# Patient Record
Sex: Female | Born: 1985 | Race: Black or African American | Hispanic: No | Marital: Single | Smoking: Never smoker
Health system: Southern US, Community
[De-identification: ages and names within clinical notes are randomized; demographics above are authoritative.]

## PROBLEM LIST (undated history)

## (undated) ENCOUNTER — Inpatient Hospital Stay (HOSPITAL_COMMUNITY): Payer: Self-pay

## (undated) DIAGNOSIS — D219 Benign neoplasm of connective and other soft tissue, unspecified: Secondary | ICD-10-CM

## (undated) DIAGNOSIS — R87629 Unspecified abnormal cytological findings in specimens from vagina: Secondary | ICD-10-CM

## (undated) DIAGNOSIS — F419 Anxiety disorder, unspecified: Secondary | ICD-10-CM

## (undated) DIAGNOSIS — O24419 Gestational diabetes mellitus in pregnancy, unspecified control: Secondary | ICD-10-CM

## (undated) DIAGNOSIS — E282 Polycystic ovarian syndrome: Secondary | ICD-10-CM

## (undated) DIAGNOSIS — F32A Depression, unspecified: Secondary | ICD-10-CM

## (undated) DIAGNOSIS — K219 Gastro-esophageal reflux disease without esophagitis: Secondary | ICD-10-CM

## (undated) DIAGNOSIS — R7303 Prediabetes: Secondary | ICD-10-CM

## (undated) DIAGNOSIS — R221 Localized swelling, mass and lump, neck: Secondary | ICD-10-CM

## (undated) DIAGNOSIS — Z8619 Personal history of other infectious and parasitic diseases: Secondary | ICD-10-CM

## (undated) DIAGNOSIS — Z789 Other specified health status: Secondary | ICD-10-CM

## (undated) DIAGNOSIS — IMO0002 Reserved for concepts with insufficient information to code with codable children: Secondary | ICD-10-CM

## (undated) DIAGNOSIS — D649 Anemia, unspecified: Secondary | ICD-10-CM

## (undated) DIAGNOSIS — E669 Obesity, unspecified: Secondary | ICD-10-CM

## (undated) HISTORY — DX: Benign neoplasm of connective and other soft tissue, unspecified: D21.9

## (undated) HISTORY — DX: Reserved for concepts with insufficient information to code with codable children: IMO0002

## (undated) HISTORY — DX: Personal history of other infectious and parasitic diseases: Z86.19

## (undated) HISTORY — DX: Gestational diabetes mellitus in pregnancy, unspecified control: O24.419

## (undated) HISTORY — DX: Obesity, unspecified: E66.9

---
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
## 2008-08-10 DIAGNOSIS — Z8619 Personal history of other infectious and parasitic diseases: Secondary | ICD-10-CM

## 2008-08-10 HISTORY — DX: Personal history of other infectious and parasitic diseases: Z86.19

## 2009-11-30 ENCOUNTER — Emergency Department (HOSPITAL_COMMUNITY): Admission: EM | Admit: 2009-11-30 | Discharge: 2009-11-30 | Payer: Self-pay | Admitting: Emergency Medicine

## 2010-10-28 LAB — POCT I-STAT, CHEM 8
BUN: 12 mg/dL (ref 6–23)
Creatinine, Ser: 0.7 mg/dL (ref 0.4–1.2)
Hemoglobin: 13.9 g/dL (ref 12.0–15.0)
Potassium: 3.7 mEq/L (ref 3.5–5.1)
Sodium: 138 mEq/L (ref 135–145)

## 2011-05-30 ENCOUNTER — Inpatient Hospital Stay (HOSPITAL_COMMUNITY)
Admission: AD | Admit: 2011-05-30 | Discharge: 2011-05-30 | Disposition: A | Discharge status: Home / Self Care | Payer: Medicaid Other | Source: Ambulatory Visit | Attending: Obstetrics & Gynecology | Admitting: Obstetrics & Gynecology

## 2011-05-30 ENCOUNTER — Inpatient Hospital Stay (HOSPITAL_COMMUNITY): Payer: Medicaid Other

## 2011-05-30 ENCOUNTER — Encounter (HOSPITAL_COMMUNITY): Payer: Self-pay

## 2011-05-30 DIAGNOSIS — O210 Mild hyperemesis gravidarum: Secondary | ICD-10-CM | POA: Insufficient documentation

## 2011-05-30 DIAGNOSIS — O219 Vomiting of pregnancy, unspecified: Secondary | ICD-10-CM

## 2011-05-30 LAB — URINALYSIS, ROUTINE W REFLEX MICROSCOPIC
Hgb urine dipstick: NEGATIVE
Leukocytes, UA: NEGATIVE
Protein, ur: NEGATIVE mg/dL
Specific Gravity, Urine: 1.02 (ref 1.005–1.030)
Urobilinogen, UA: 0.2 mg/dL (ref 0.0–1.0)

## 2011-05-30 LAB — WET PREP, GENITAL: Trich, Wet Prep: NONE SEEN

## 2011-05-30 LAB — POCT PREGNANCY, URINE: Preg Test, Ur: POSITIVE

## 2011-05-30 MED ORDER — PRENATAL RX 60-1 MG PO TABS: 1.0000 | ORAL_TABLET | Freq: Every day | ORAL | Status: DC

## 2011-05-30 NOTE — ED Provider Notes (Signed)
Brenda [name] y.o.G1P0 @[redacted]w[redacted]d  by sure LMP Chief Complaint  Patient presents with  . Emesis  . Abdominal Cramping    SUBJECTIVE  HPI: 1 day hx suprapubic cramping like menstrual cramps but more severe at times. Tylenol not helping.She's also had nausea and rare vomiting for several days.  No vaginal bleeding or irriattive vaginal discharge. No dysuria, hematuria, frequency, urgency, back pain or fever/chills. BMs normal. [employer], needs to apply for [facility]  Past Medical History  Diagnosis Date  . No pertinent past medical history     Gyn Hx: neg STIs No past surgical history on file. History   Social History  . Marital Status: Single    Spouse Name: N/A    Number of Children: N/A  . Years of Education: N/A   Occupational History  . Not on file.   Social History Main Topics  . Smoking status: Not on file  . Smokeless tobacco: Not on file  . Alcohol Use:   . Drug Use:   . Sexually Active:    Other Topics Concern  . Not on file   Social History Narrative  . No narrative on file   No current facility-administered medications on file prior to encounter.   No current outpatient prescriptions on file prior to encounter.   No Known Allergies  ROS: Pertinent items in HPI  OBJECTIVE  BP 127/75  Pulse 105  Temp(Src) 98.2 F (36.8 C) (Oral)  Resp 16  Ht 5' 3.5" (1.613 m)  Wt 98.703 kg (217 lb 9.6 oz)  BMI 37.94 kg/m2  LMP 04/14/2011   Physical Exam:  General: WN/WD in NAD Abd: obese NT Pelvic: NEFG; Vagina pink without lesions, milky discharge; cx post clean nulliparous VE: Cx L/C/H, no CMT, c/w 6 wk size Back: neg CVAT Ext:no edema  US Ob Comp Less 14 Wks  05/30/2011  OBSTETRICAL ULTRASOUND: This exam was performed within a [facility]. The OB US report was generated in the AS system, and faxed to the ordering physician.   This report is also available in [employer] and in the [employer]. See AS  Obstetric US report.   US Ob Transvaginal  05/30/2011  OBSTETRICAL ULTRASOUND: This exam was performed within a [facility] Department. The OB US report was generated in the AS system, and faxed to the ordering physician.   This report is also available in [employer] and in the [employer]. See AS Obstetric US report.   Per [place] tech: viable IUP with HR 102 and [redacted]w[redacted]d by sac size  ASSESSMENT  Viable IUP [redacted]w[redacted]d Nausea of pregnancy   PLAN WP, GC/CT sent; rx PNV and phenergan; preg verif letter; list of care providers; precautions reviewed

## 2011-05-30 NOTE — Progress Notes (Signed)
LMP 9/4/-9/7, positive pregnant test at home, cramping since yesterday, vomited x 1 in the evening.

## 2011-06-01 LAB — GC/CHLAMYDIA PROBE AMP, GENITAL: GC Probe Amp, Genital: NEGATIVE

## 2011-06-02 NOTE — ED Provider Notes (Signed)
Attestation of Attending Supervision of Advanced Practitioner: Evaluation and management procedures were performed by the PA/NP/CNM/OB Fellow under my supervision/collaboration. Chart reviewed and agree with management and plan.  ANYANWU,UGONNA A M.D. 06/02/2011 11:19 AM   

## 2011-06-16 ENCOUNTER — Inpatient Hospital Stay (HOSPITAL_COMMUNITY)
Admission: AD | Admit: 2011-06-16 | Discharge: 2011-06-16 | Discharge status: Against Medical Advice | Payer: Self-pay | Source: Ambulatory Visit | Attending: Obstetrics & Gynecology | Admitting: Obstetrics & Gynecology

## 2011-06-16 DIAGNOSIS — R509 Fever, unspecified: Secondary | ICD-10-CM | POA: Insufficient documentation

## 2011-06-16 DIAGNOSIS — R51 Headache: Secondary | ICD-10-CM | POA: Insufficient documentation

## 2011-06-16 DIAGNOSIS — O99891 Other specified diseases and conditions complicating pregnancy: Secondary | ICD-10-CM | POA: Insufficient documentation

## 2011-06-16 LAB — URINALYSIS, ROUTINE W REFLEX MICROSCOPIC
Glucose, UA: NEGATIVE mg/dL
Hgb urine dipstick: NEGATIVE
Leukocytes, UA: NEGATIVE
Protein, ur: NEGATIVE mg/dL
Specific Gravity, Urine: >1.03 — ABNORMAL HIGH (ref 1.005–1.030)
pH: 6 (ref 5.0–8.0)

## 2011-06-16 NOTE — Progress Notes (Signed)
Patient states she has been having cold like symptoms since 11-4. Has been feeling warm and tired but has not taken her temp. Just not feeling well.

## 2011-06-16 NOTE — ED Notes (Signed)
Pt signed out; she stated she was tired and hungry and didn't want to wait any longer.

## 2011-07-15 ENCOUNTER — Other Ambulatory Visit (HOSPITAL_COMMUNITY): Payer: Self-pay | Admitting: Physician Assistant

## 2011-07-15 DIAGNOSIS — Z3682 Encounter for antenatal screening for nuchal translucency: Secondary | ICD-10-CM

## 2011-07-15 LAB — OB RESULTS CONSOLE RUBELLA ANTIBODY, IGM: Rubella: NON-IMMUNE/NOT IMMUNE

## 2011-07-15 LAB — OB RESULTS CONSOLE RPR: RPR: NONREACTIVE

## 2011-07-15 LAB — OB RESULTS CONSOLE HIV ANTIBODY (ROUTINE TESTING): HIV: NONREACTIVE

## 2011-07-16 ENCOUNTER — Ambulatory Visit (HOSPITAL_COMMUNITY)
Admission: RE | Admit: 2011-07-16 | Discharge: 2011-07-16 | Disposition: A | Discharge status: Home / Self Care | Payer: Self-pay | Source: Ambulatory Visit | Attending: Physician Assistant | Admitting: Physician Assistant

## 2011-07-16 DIAGNOSIS — O3510X0 Maternal care for (suspected) chromosomal abnormality in fetus, unspecified, not applicable or unspecified: Secondary | ICD-10-CM | POA: Insufficient documentation

## 2011-07-16 DIAGNOSIS — E669 Obesity, unspecified: Secondary | ICD-10-CM | POA: Insufficient documentation

## 2011-07-16 DIAGNOSIS — O351XX0 Maternal care for (suspected) chromosomal abnormality in fetus, not applicable or unspecified: Secondary | ICD-10-CM | POA: Insufficient documentation

## 2011-07-16 DIAGNOSIS — Z3682 Encounter for antenatal screening for nuchal translucency: Secondary | ICD-10-CM

## 2011-07-16 DIAGNOSIS — O3440 Maternal care for other abnormalities of cervix, unspecified trimester: Secondary | ICD-10-CM | POA: Insufficient documentation

## 2011-08-06 DIAGNOSIS — J45998 Other asthma: Secondary | ICD-10-CM | POA: Insufficient documentation

## 2011-08-06 DIAGNOSIS — D219 Benign neoplasm of connective and other soft tissue, unspecified: Secondary | ICD-10-CM | POA: Insufficient documentation

## 2011-08-06 LAB — OB RESULTS CONSOLE ABO/RH

## 2011-08-11 NOTE — L&D Delivery Note (Signed)
Delivery Note At 7:26 AM a viable female, "Brenda [name]", was delivered via Vaginal, Spontaneous Delivery (Presentation: Right Occiput Anterior).  APGAR: 9, 9; weight .   Placenta status: Intact, Spontaneous.  Cord: 3 vessels with the following complications: None.  Cord pH: NA  Anesthesia: Epidural  Episiotomy: None Lacerations: Periurethral--bilateral, repaired for hemostasis Suture Repair: 3.0 monocryl Est. Blood Loss (mL): 200 cc  Mom to postpartum.  Baby to skin to skin. Will check fasting CBG in am.  Dallys Nowakowski 01/15/2012, 8:18 AM

## 2011-08-21 ENCOUNTER — Encounter: Payer: Self-pay | Admitting: Obstetrics and Gynecology

## 2011-08-21 ENCOUNTER — Encounter (HOSPITAL_COMMUNITY): Payer: Self-pay

## 2011-08-21 ENCOUNTER — Inpatient Hospital Stay (HOSPITAL_COMMUNITY)
Admission: AD | Admit: 2011-08-21 | Discharge: 2011-08-21 | Disposition: A | Discharge status: Home / Self Care | Payer: Medicaid Other | Source: Ambulatory Visit | Attending: Obstetrics and Gynecology | Admitting: Obstetrics and Gynecology

## 2011-08-21 DIAGNOSIS — R51 Headache: Secondary | ICD-10-CM | POA: Insufficient documentation

## 2011-08-21 DIAGNOSIS — O99891 Other specified diseases and conditions complicating pregnancy: Secondary | ICD-10-CM | POA: Insufficient documentation

## 2011-08-21 LAB — URINALYSIS, ROUTINE W REFLEX MICROSCOPIC
Glucose, UA: NEGATIVE mg/dL
Ketones, ur: 15 mg/dL — AB
Leukocytes, UA: NEGATIVE
Nitrite: NEGATIVE
Specific Gravity, Urine: 1.02 (ref 1.005–1.030)
pH: 7.5 (ref 5.0–8.0)

## 2011-08-21 MED ORDER — IBUPROFEN 800 MG PO TABS
800.0000 mg | ORAL_TABLET | Freq: Once | ORAL | Status: AC
  Administered 2011-08-21: 800 mg via ORAL
  Filled 2011-08-21: qty 1

## 2011-08-21 NOTE — Progress Notes (Signed)
No adverse effect from ibuprofen

## 2011-08-21 NOTE — Progress Notes (Signed)
Pt states headache x2-3 days, pain more on right side of head. Denies visual disturbance, has not taken any medications for h/a. Also states she has been waiting too long to eat, then will be overly hungry and feels fatigued.

## 2011-08-21 NOTE — ED Provider Notes (Signed)
History     Chief Complaint  Patient presents with  . Headache   HPI Comments: Pt is a 25yo G1P0 at [redacted]w[redacted]d that arrives to MAU unannounced with c/o HA x2 days. Denies any other c/o. Has not taken any OTC. Reports not eating very often because "I don't feel hungry" and doesn't like to drink water. Denies any cramping, bleeding, d/c. Has appointment the 23rd for Anat [place].     Headache       Past Medical History  Diagnosis Date  . Asthma     Past Surgical History  Procedure Date  . No past surgeries     Family History  Problem Relation Age of Onset  . Anesthesia problems Neg Hx     History  Substance Use Topics  . Smoking status: Never Smoker   . Smokeless tobacco: Not on file  . Alcohol Use: No    Allergies: No Known Allergies  Prescriptions prior to admission  Medication Sig Dispense Refill  . Prenatal Vit-Fe Fumarate-FA (PRENATAL MULTIVITAMIN) 60-1 MG tablet Take 1 tablet by mouth daily.  30 tablet  4  . DISCONTD: Pseudoephedrine-DM-GG (ROBITUSSIN CF PO) Take 5 mLs by mouth daily as needed. Patient took this medication for a cold.      Marland Kitchen DISCONTD: acetaminophen (TYLENOL) 325 MG tablet Take by mouth every 6 (six) hours as needed. Patient used this medication for pain.        Review of Systems  Neurological: Positive for headaches.  All other systems reviewed and are negative.   Physical Exam   Blood pressure 126/74, pulse 106, temperature 98.4 F (36.9 C), temperature source Oral, resp. rate 16, height 5' 3.75" (1.619 m), weight 104.838 kg (231 lb 2 oz), last menstrual period 04/14/2011.  Physical Exam  Nursing note and vitals reviewed. Constitutional: She is oriented to person, place, and time. She appears well-developed and well-nourished.  Cardiovascular: Normal rate.   Respiratory: Effort normal.  GI: Soft.  Genitourinary:       deferred  Musculoskeletal: Normal range of motion. She exhibits no edema.  Neurological: She is alert and oriented to  person, place, and time. She has normal reflexes.  Skin: Skin is warm and dry.  Psychiatric: She has a normal mood and affect. Her behavior is normal.    MAU Course  Procedures   Assessment and Plan  IUP at 18w Headache - pt states its starting to feel better after napping in MAU room +FHT's  Motrin 800mg  PO D/C home F/u routine Recommend increasing PO fluids and eating more frequently.    Willett Lefeber M 08/21/2011, 4:02 PM

## 2011-08-26 ENCOUNTER — Ambulatory Visit (HOSPITAL_COMMUNITY): Payer: Medicaid Other | Attending: Physician Assistant

## 2011-08-31 ENCOUNTER — Other Ambulatory Visit: Payer: Self-pay

## 2011-09-09 ENCOUNTER — Ambulatory Visit (HOSPITAL_COMMUNITY)
Admission: RE | Admit: 2011-09-09 | Discharge: 2011-09-09 | Disposition: A | Discharge status: Home / Self Care | Payer: Medicaid Other | Source: Ambulatory Visit | Attending: Physician Assistant | Admitting: Physician Assistant

## 2011-09-09 DIAGNOSIS — O3410 Maternal care for benign tumor of corpus uteri, unspecified trimester: Secondary | ICD-10-CM | POA: Insufficient documentation

## 2011-09-09 DIAGNOSIS — Z363 Encounter for antenatal screening for malformations: Secondary | ICD-10-CM | POA: Insufficient documentation

## 2011-09-09 DIAGNOSIS — O358XX0 Maternal care for other (suspected) fetal abnormality and damage, not applicable or unspecified: Secondary | ICD-10-CM | POA: Insufficient documentation

## 2011-09-09 DIAGNOSIS — O99210 Obesity complicating pregnancy, unspecified trimester: Secondary | ICD-10-CM | POA: Insufficient documentation

## 2011-09-09 DIAGNOSIS — Z1389 Encounter for screening for other disorder: Secondary | ICD-10-CM | POA: Insufficient documentation

## 2011-09-09 DIAGNOSIS — E669 Obesity, unspecified: Secondary | ICD-10-CM | POA: Insufficient documentation

## 2011-09-09 DIAGNOSIS — Z3689 Encounter for other specified antenatal screening: Secondary | ICD-10-CM

## 2011-09-09 DIAGNOSIS — Z3682 Encounter for antenatal screening for nuchal translucency: Secondary | ICD-10-CM

## 2011-09-09 NOTE — Progress Notes (Signed)
Obstetric ultrasound performed today.  Fetal anatomy limited but reassuring.  Overall growth lagging.  Follow up scheduled.  Please see full report in [facility].

## 2011-09-23 ENCOUNTER — Other Ambulatory Visit (HOSPITAL_COMMUNITY): Payer: Self-pay | Admitting: Maternal and Fetal Medicine

## 2011-09-23 ENCOUNTER — Ambulatory Visit (HOSPITAL_COMMUNITY)
Admission: RE | Admit: 2011-09-23 | Discharge: 2011-09-23 | Disposition: A | Discharge status: Home / Self Care | Payer: Medicaid Other | Source: Ambulatory Visit | Attending: Physician Assistant | Admitting: Physician Assistant

## 2011-09-23 DIAGNOSIS — D219 Benign neoplasm of connective and other soft tissue, unspecified: Secondary | ICD-10-CM

## 2011-09-23 DIAGNOSIS — E669 Obesity, unspecified: Secondary | ICD-10-CM | POA: Insufficient documentation

## 2011-09-23 DIAGNOSIS — O99210 Obesity complicating pregnancy, unspecified trimester: Secondary | ICD-10-CM

## 2011-09-23 DIAGNOSIS — O3410 Maternal care for benign tumor of corpus uteri, unspecified trimester: Secondary | ICD-10-CM | POA: Insufficient documentation

## 2011-09-23 DIAGNOSIS — O3440 Maternal care for other abnormalities of cervix, unspecified trimester: Secondary | ICD-10-CM | POA: Insufficient documentation

## 2011-09-23 DIAGNOSIS — Z3689 Encounter for other specified antenatal screening: Secondary | ICD-10-CM

## 2011-10-13 ENCOUNTER — Other Ambulatory Visit (HOSPITAL_COMMUNITY): Payer: Self-pay | Admitting: Physician Assistant

## 2011-10-13 DIAGNOSIS — O3440 Maternal care for other abnormalities of cervix, unspecified trimester: Secondary | ICD-10-CM

## 2011-10-13 DIAGNOSIS — O3410 Maternal care for benign tumor of corpus uteri, unspecified trimester: Secondary | ICD-10-CM

## 2011-10-13 DIAGNOSIS — O99891 Other specified diseases and conditions complicating pregnancy: Secondary | ICD-10-CM

## 2011-10-14 ENCOUNTER — Other Ambulatory Visit (HOSPITAL_COMMUNITY): Payer: Self-pay | Admitting: Physician Assistant

## 2011-10-14 ENCOUNTER — Ambulatory Visit (HOSPITAL_COMMUNITY)
Admission: RE | Admit: 2011-10-14 | Discharge: 2011-10-14 | Disposition: A | Discharge status: Home / Self Care | Payer: Medicaid Other | Source: Ambulatory Visit | Attending: Obstetrics and Gynecology | Admitting: Obstetrics and Gynecology

## 2011-10-14 DIAGNOSIS — O3440 Maternal care for other abnormalities of cervix, unspecified trimester: Secondary | ICD-10-CM

## 2011-10-14 DIAGNOSIS — O3410 Maternal care for benign tumor of corpus uteri, unspecified trimester: Secondary | ICD-10-CM | POA: Insufficient documentation

## 2011-10-14 DIAGNOSIS — O99891 Other specified diseases and conditions complicating pregnancy: Secondary | ICD-10-CM

## 2011-10-14 DIAGNOSIS — E669 Obesity, unspecified: Secondary | ICD-10-CM | POA: Insufficient documentation

## 2011-10-14 DIAGNOSIS — O365990 Maternal care for other known or suspected poor fetal growth, unspecified trimester, not applicable or unspecified: Secondary | ICD-10-CM

## 2011-10-16 ENCOUNTER — Inpatient Hospital Stay (HOSPITAL_COMMUNITY)
Admission: AD | Admit: 2011-10-16 | Discharge: 2011-10-16 | Disposition: A | Discharge status: Home / Self Care | Payer: Medicaid Other | Source: Ambulatory Visit | Attending: Obstetrics and Gynecology | Admitting: Obstetrics and Gynecology

## 2011-10-16 ENCOUNTER — Encounter (HOSPITAL_COMMUNITY): Payer: Self-pay

## 2011-10-16 DIAGNOSIS — O479 False labor, unspecified: Secondary | ICD-10-CM

## 2011-10-16 DIAGNOSIS — O4700 False labor before 37 completed weeks of gestation, unspecified trimester: Secondary | ICD-10-CM | POA: Insufficient documentation

## 2011-10-16 LAB — URINALYSIS, ROUTINE W REFLEX MICROSCOPIC
Bilirubin Urine: NEGATIVE
Glucose, UA: 250 mg/dL — AB
Ketones, ur: NEGATIVE mg/dL
pH: 6 (ref 5.0–8.0)

## 2011-10-16 LAB — WET PREP, GENITAL
Clue Cells Wet Prep HPF POC: NONE SEEN
Trich, Wet Prep: NONE SEEN

## 2011-10-16 NOTE — Progress Notes (Signed)
History    G1 P0  26 3/7 week IUP with c/o of cramping denie srom, vag bleeding, N,V,D,or UTI s/s. With +FM. No chief complaint on file.  @SFHPI @  OB History    Grav Para Term Preterm Abortions TAB SAB Ect Mult Living   1 0 0 0 0 0 0 0 0 0       Past Medical History  Diagnosis Date  . Asthma     Past Surgical History  Procedure Date  . No past surgeries     Family History  Problem Relation Age of Onset  . Anesthesia problems Neg Hx   . Hypertension Mother   . Hypertension Father     History  Substance Use Topics  . Smoking status: Never Smoker   . Smokeless tobacco: Not on file  . Alcohol Use: No    Allergies: No Known Allergies  Prescriptions prior to admission  Medication Sig Dispense Refill  . Prenatal Vit-Fe Fumarate-FA (PRENATAL MULTIVITAMIN) 60-1 MG tablet Take 1 tablet by mouth daily.  30 tablet  4   Results for orders placed during the hospital encounter of 10/16/11 (from the past 24 hour(s))  URINALYSIS, ROUTINE W REFLEX MICROSCOPIC     Status: Abnormal   Collection Time   10/16/11  3:20 PM      Component Value Range   Color, Urine YELLOW  YELLOW    APPearance CLEAR  CLEAR    Specific Gravity, Urine 1.020  1.005 - 1.030    pH 6.0  5.0 - 8.0    Glucose, UA 250 (*) NEGATIVE (mg/dL)   Hgb urine dipstick TRACE (*) NEGATIVE    Bilirubin Urine NEGATIVE  NEGATIVE    Ketones, ur NEGATIVE  NEGATIVE (mg/dL)   Protein, ur NEGATIVE  NEGATIVE (mg/dL)   Urobilinogen, UA 0.2  0.0 - 1.0 (mg/dL)   Nitrite NEGATIVE  NEGATIVE    Leukocytes, UA NEGATIVE  NEGATIVE   URINE MICROSCOPIC-ADD ON     Status: Abnormal   Collection Time   10/16/11  3:20 PM      Component Value Range   Squamous Epithelial / LPF MANY (*) RARE    WBC, UA 0-2  <3 (WBC/hpf)  WET PREP, GENITAL     Status: Abnormal   Collection Time   10/16/11  3:58 PM      Component Value Range   Yeast Wet Prep HPF POC NONE SEEN  NONE SEEN    Trich, Wet Prep NONE SEEN  NONE SEEN    Clue Cells Wet Prep HPF POC  NONE SEEN  NONE SEEN    WBC, Wet Prep HPF POC FEW (*) NONE SEEN   FETAL FIBRONECTIN     Status: Normal   Collection Time   10/16/11  4:03 PM      Component Value Range   Fetal Fibronectin NEGATIVE  NEGATIVE   PE abd soft, gravid, nt, EGBUS wnl, SSE, digital exam cervix LTC Fhts category 1 UC few A 26 3/7 week IUP P discharge home s/s ptl to report, collaboration with Dr. Estanislado Pandy per telephone. Lavera Guise, CNM

## 2011-10-16 NOTE — Discharge Instructions (Signed)

## 2011-10-17 LAB — GC/CHLAMYDIA PROBE AMP, GENITAL
Chlamydia, DNA Probe: NEGATIVE
GC Probe Amp, Genital: NEGATIVE

## 2011-10-28 ENCOUNTER — Encounter (INDEPENDENT_AMBULATORY_CARE_PROVIDER_SITE_OTHER): Payer: Medicaid Other | Admitting: Obstetrics and Gynecology

## 2011-10-28 DIAGNOSIS — O99810 Abnormal glucose complicating pregnancy: Secondary | ICD-10-CM

## 2011-11-04 ENCOUNTER — Encounter: Payer: Medicaid Other | Attending: Obstetrics and Gynecology | Admitting: *Deleted

## 2011-11-04 ENCOUNTER — Ambulatory Visit (HOSPITAL_COMMUNITY)
Admission: RE | Admit: 2011-11-04 | Discharge: 2011-11-04 | Disposition: A | Discharge status: Home / Self Care | Payer: Medicaid Other | Source: Ambulatory Visit | Attending: Obstetrics and Gynecology | Admitting: Obstetrics and Gynecology

## 2011-11-04 ENCOUNTER — Other Ambulatory Visit (HOSPITAL_COMMUNITY): Payer: Self-pay | Admitting: Maternal and Fetal Medicine

## 2011-11-04 DIAGNOSIS — O3440 Maternal care for other abnormalities of cervix, unspecified trimester: Secondary | ICD-10-CM | POA: Insufficient documentation

## 2011-11-04 DIAGNOSIS — O3410 Maternal care for benign tumor of corpus uteri, unspecified trimester: Secondary | ICD-10-CM | POA: Insufficient documentation

## 2011-11-04 DIAGNOSIS — E669 Obesity, unspecified: Secondary | ICD-10-CM | POA: Insufficient documentation

## 2011-11-04 DIAGNOSIS — Z713 Dietary counseling and surveillance: Secondary | ICD-10-CM | POA: Insufficient documentation

## 2011-11-04 DIAGNOSIS — O99810 Abnormal glucose complicating pregnancy: Secondary | ICD-10-CM | POA: Insufficient documentation

## 2011-11-04 DIAGNOSIS — O365990 Maternal care for other known or suspected poor fetal growth, unspecified trimester, not applicable or unspecified: Secondary | ICD-10-CM

## 2011-11-05 ENCOUNTER — Encounter: Payer: Self-pay | Admitting: *Deleted

## 2011-11-05 NOTE — Patient Instructions (Signed)
Goals:  Check glucose levels per MD as instructed  Follow Gestational Diabetes Diet as instructed  Call for follow-up as needed    

## 2011-11-05 NOTE — Progress Notes (Signed)
  Patient was seen on 11/04/2011 for Gestational Diabetes self-management class at the [facility]. The following learning objectives were met by the patient during this course:   States the definition of Gestational Diabetes  States why dietary management is important in controlling blood glucose  Describes the effects each nutrient has on blood glucose levels  Demonstrates ability to create a balanced meal plan  Demonstrates carbohydrate counting   States when to check blood glucose levels  Demonstrates proper blood glucose monitoring techniques  States the effect of stress and exercise on blood glucose levels  States the importance of limiting caffeine and abstaining from alcohol and smoking  Blood glucose monitor given: Accu Chek Nano BG Monitoring Kit Lot # [id number] Exp: 11/07/2012 Blood glucose reading: 60 mg/dl, non-symptmatic  Patient instructed to monitor glucose levels: FBS: 60 - <90 2 hour: <120  *Patient received handouts:  Nutrition Diabetes and Pregnancy  Carbohydrate Counting List  Patient will be seen for follow-up as needed.

## 2011-11-07 ENCOUNTER — Encounter (HOSPITAL_COMMUNITY): Payer: Self-pay | Admitting: Obstetrics and Gynecology

## 2011-11-07 ENCOUNTER — Inpatient Hospital Stay (HOSPITAL_COMMUNITY)
Admission: AD | Admit: 2011-11-07 | Discharge: 2011-11-07 | Disposition: A | Discharge status: Home / Self Care | Payer: Medicaid Other | Source: Ambulatory Visit | Attending: Obstetrics and Gynecology | Admitting: Obstetrics and Gynecology

## 2011-11-07 DIAGNOSIS — O99891 Other specified diseases and conditions complicating pregnancy: Secondary | ICD-10-CM | POA: Insufficient documentation

## 2011-11-07 DIAGNOSIS — T192XXA Foreign body in vulva and vagina, initial encounter: Secondary | ICD-10-CM | POA: Insufficient documentation

## 2011-11-07 NOTE — MAU Provider Note (Signed)
History   26 yo G1P0 at 31 4/7 weeks presented unannounced with "lost condom in vagina" x 1 hour from IC.  Denies bleeding, leaking, dysuria, cramping, or any other complaint.  Pregnancy remarkable for: Newly diagnosed gestational diabetes  Chief Complaint  Patient presents with  . Foreign Body in Vagina     OB History    Grav Para Term Preterm Abortions TAB SAB Ect Mult Living   1 0 0 0 0 0 0 0 0 0       Past Medical History  Diagnosis Date  . Asthma     Past Surgical History  Procedure Date  . No past surgeries     Family History  Problem Relation Age of Onset  . Anesthesia problems Neg Hx   . Hypertension Mother   . Hypertension Father     History  Substance Use Topics  . Smoking status: Never Smoker   . Smokeless tobacco: Not on file  . Alcohol Use: No    Allergies: No Known Allergies  Prescriptions prior to admission  Medication Sig Dispense Refill  . calcium carbonate (TUMS - DOSED IN MG ELEMENTAL CALCIUM) 500 MG chewable tablet Chew 1 tablet by mouth daily as needed. For heartburn      . Pramoxine HCl (VAGISIL ANTI-ITCH MEDICATED EX) Apply 1 application topically daily as needed. For itching in vagina      . Prenatal Vit-Fe Fumarate-FA (PRENATAL MULTIVITAMIN) TABS Take 1 tablet by mouth daily.         Physical Exam   Blood pressure 106/69, pulse 112, temperature 97.6 F (36.4 C), temperature source Oral, resp. rate 16, last menstrual period 04/14/2011.  Abdominal gravid, NT Pelvic--retained condom noted in posterior fornix of vagina, removed without difficulty. FHR 150s per doppler  ED Course  IUP at 29 4/7 weeks Retained condom--removed without difficulty  Plan: D/C home. Keep scheduled appointment at [facility] on Monday.  Nigel Bridgeman, CNM, MM 11/07/11 2:35pm

## 2011-11-07 NOTE — MAU Note (Signed)
Had intercourse thinks condom still inside had intercourse about 1 hour ago  [redacted]w[redacted]d

## 2011-11-07 NOTE — Discharge Instructions (Signed)
Gestational Diabetes Mellitus Gestational diabetes mellitus (GDM) is diabetes that occurs only during pregnancy. This happens when the body cannot properly handle the glucose (sugar) that increases in the blood after eating. During pregnancy, insulin resistance (reduced sensitivity to insulin) occurs because of the release of hormones from the placenta. Usually, the pancreas of pregnant women produces enough insulin to overcome the resistance that occurs. However, in gestational diabetes, the insulin is there but it does not work effectively. If the resistance is severe enough that the pancreas does not produce enough insulin, extra glucose builds up in the blood.  WHO IS AT RISK FOR DEVELOPING GESTATIONAL DIABETES?  Women with a history of diabetes in the family.   Women over age 25.   Women who are overweight.   Women in certain ethnic groups (Hispanic, African American, Native American, Asian and Pacific Islander).  WHAT CAN HAPPEN TO THE BABY? If the mother's blood glucose is too high while she is pregnant, the extra sugar will travel through the umbilical cord to the baby. Some of the problems the baby may have are:  Large Baby - If the baby receives too much sugar, the baby will gain more weight. This may cause the baby to be too large to be born normally (vaginally) and a Cesarean section (C-section) may be needed.   Low Blood Glucose (hypoglycemia) - The baby makes extra insulin, in response to the extra sugar its gets from its mother. When the baby is born and no longer needs this extra insulin, the baby's blood glucose level may drop.   Jaundice (yellow coloring of the skin and eyes) - This is fairly common in babies. It is caused from a build-up of the chemical called bilirubin. This is rarely serious, but is seen more often in babies whose mothers had gestational diabetes.  RISKS TO THE MOTHER Women who have had gestational diabetes may be at higher risk for some problems,  including:  Preeclampsia or toxemia, which includes problems with high blood pressure. Blood pressure and protein levels in the urine must be checked frequently.   Infections.   Cesarean section (C-section) for delivery.   Developing Type 2 diabetes later in life. About 30-50% will develop diabetes later, especially if obese.  DIAGNOSIS  The hormones that cause insulin resistance are highest at about 24-28 weeks of pregnancy. If symptoms are experienced, they are much like symptoms you would normally expect during pregnancy.  GDM is often diagnosed using a two part method: 1. After 24-28 weeks of pregnancy, the woman drinks a glucose solution and takes a blood test. If the glucose level is high, a second test will be given.  2. Oral Glucose Tolerance Test (OGTT) which is 3 hours long - After not eating overnight, the blood glucose is checked. The woman drinks a glucose solution, and hourly blood glucose tests are taken.  If the woman has risk factors for GDM, the caregiver may test earlier than 24 weeks of pregnancy. TREATMENT  Treatment of GDM is directed at keeping the mother's blood glucose level normal, and may include:  Meal planning.   Taking insulin or other medicine to control your blood glucose level.   Exercise.   Keeping a daily record of the foods you eat.   Blood glucose monitoring and keeping a record of your blood glucose levels.   May monitor ketone levels in the urine, although this is no longer considered necessary in most pregnancies.  HOME CARE INSTRUCTIONS  While you are pregnant:    Follow your caregiver's advice regarding your prenatal appointments, meal planning, exercise, medicines, vitamins, blood and other tests, and physical activities.   Keep a record of your meals, blood glucose tests, and the amount of insulin you are taking (if any). Show this to your caregiver at every prenatal visit.   If you have GDM, you may have problems with hypoglycemia (low  blood glucose). You may suspect this if you become suddenly dizzy, feel shaky, and/or weak. If you think this is happening and you have a glucose meter, try to test your blood glucose level. Follow your caregiver's advice for when and how to treat your low blood glucose. Generally, the 15:15 rule is followed: Treat by consuming 15 grams of carbohydrates, wait 15 minutes, and recheck blood glucose. Examples of 15 grams of carbohydrates are:   1 cup skim or low-fat milk.    cup juice.   3-4 glucose tablets.   5-6 hard candies.   1 small box raisins.    cup regular soda pop.   Practice good hygiene, to avoid infections.   Do not smoke.  SEEK MEDICAL CARE IF:   You develop abnormal vaginal discharge, with or without itching.   You become weak and tired more than expected.   You seem to sweat a lot.   You have a sudden increase in weight, 5 pounds or more in one week.   You are losing weight, 3 pounds or more in a week.   Your blood glucose level is high, and you need instructions on what to do about it.  SEEK IMMEDIATE MEDICAL CARE IF:   You develop a severe headache.   You faint or pass out.   You develop nausea and vomiting.   You become disoriented or confused.   You have a convulsion.   You develop vision problems.   You develop stomach pain.   You develop vaginal bleeding.   You develop uterine contractions.   You have leaking or a gush of fluid from the vagina.  AFTER YOU HAVE THE BABY:  Go to all of your follow-up appointments, and have blood tests as advised by your caregiver.   Maintain a healthy lifestyle, to prevent diabetes in the future. This includes:   Following a healthy meal plan.   Controlling your weight.   Getting enough exercise and proper rest.   Do not smoke.   Breastfeed your baby if you can. This will lower the chance of you and your baby developing diabetes later in life.  For more information about diabetes, go to the American  Diabetes Association at: www.americandiabetesassociation.org. For more information about gestational diabetes, go to the American Congress of Obstetricians and Gynecologists at: www.acog.org. Document Released: 11/02/2000 Document Revised: 07/16/2011 Document Reviewed: 05/27/2009 [employer] Patient Information 2012 [employer]. 

## 2011-11-09 ENCOUNTER — Encounter (INDEPENDENT_AMBULATORY_CARE_PROVIDER_SITE_OTHER): Payer: Medicaid Other | Admitting: Obstetrics and Gynecology

## 2011-11-09 ENCOUNTER — Encounter: Payer: Medicaid Other | Admitting: Obstetrics and Gynecology

## 2011-11-09 DIAGNOSIS — O99810 Abnormal glucose complicating pregnancy: Secondary | ICD-10-CM

## 2011-11-10 ENCOUNTER — Other Ambulatory Visit: Payer: Self-pay

## 2011-11-12 ENCOUNTER — Other Ambulatory Visit: Payer: Self-pay

## 2011-11-12 DIAGNOSIS — O368990 Maternal care for other specified fetal problems, unspecified trimester, not applicable or unspecified: Secondary | ICD-10-CM

## 2011-11-23 ENCOUNTER — Other Ambulatory Visit: Payer: Self-pay | Admitting: Obstetrics and Gynecology

## 2011-11-23 DIAGNOSIS — O99810 Abnormal glucose complicating pregnancy: Secondary | ICD-10-CM

## 2011-11-23 DIAGNOSIS — O99891 Other specified diseases and conditions complicating pregnancy: Secondary | ICD-10-CM

## 2011-11-24 ENCOUNTER — Ambulatory Visit (INDEPENDENT_AMBULATORY_CARE_PROVIDER_SITE_OTHER): Payer: Medicaid Other

## 2011-11-24 ENCOUNTER — Other Ambulatory Visit: Payer: Self-pay | Admitting: Obstetrics and Gynecology

## 2011-11-24 ENCOUNTER — Encounter: Payer: Self-pay | Admitting: Obstetrics and Gynecology

## 2011-11-24 ENCOUNTER — Other Ambulatory Visit: Payer: Medicaid Other

## 2011-11-24 ENCOUNTER — Ambulatory Visit (INDEPENDENT_AMBULATORY_CARE_PROVIDER_SITE_OTHER): Payer: Medicaid Other | Admitting: Obstetrics and Gynecology

## 2011-11-24 VITALS — BP 112/62 | Wt 237.0 lb

## 2011-11-24 DIAGNOSIS — O368990 Maternal care for other specified fetal problems, unspecified trimester, not applicable or unspecified: Secondary | ICD-10-CM

## 2011-11-24 DIAGNOSIS — O99810 Abnormal glucose complicating pregnancy: Secondary | ICD-10-CM

## 2011-11-24 DIAGNOSIS — O24419 Gestational diabetes mellitus in pregnancy, unspecified control: Secondary | ICD-10-CM

## 2011-11-24 DIAGNOSIS — O43899 Other placental disorders, unspecified trimester: Secondary | ICD-10-CM

## 2011-11-24 LAB — US OB FOLLOW UP

## 2011-11-24 NOTE — Patient Instructions (Signed)
Fetal Movement Counts Patient Name: __________________________________________________ Patient Due Date: ____________________ Kick counts is highly recommended in high risk pregnancies, but it is a good idea for every pregnant woman to do. Start counting fetal movements at 28 weeks of the pregnancy. Fetal movements increase after eating a full meal or eating or drinking something sweet (the blood sugar is higher). It is also important to drink plenty of fluids (well hydrated) before doing the count. Lie on your left side because it helps with the circulation or you can sit in a comfortable chair with your arms over your belly (abdomen) with no distractions around you. DOING THE COUNT  Try to do the count the same time of day each time you do it.   Mark the day and time, then see how long it takes for you to feel 10 movements (kicks, flutters, swishes, rolls). You should have at least 10 movements within 2 hours. You will most likely feel 10 movements in much less than 2 hours. If you do not, wait an hour and count again. After a couple of days you will see a pattern.   What you are looking for is a change in the pattern or not enough counts in 2 hours. Is it taking longer in time to reach 10 movements?  SEEK MEDICAL CARE IF:  You feel less than 10 counts in 2 hours. Tried twice.   No movement in one hour.   The pattern is changing or taking longer each day to reach 10 counts in 2 hours.   You feel the baby is not moving as it usually does.  Date: ____________ Movements: ____________ Start time: ____________ Finish time: ____________  Date: ____________ Movements: ____________ Start time: ____________ Finish time: ____________ Date: ____________ Movements: ____________ Start time: ____________ Finish time: ____________ Date: ____________ Movements: ____________ Start time: ____________ Finish time: ____________ Date: ____________ Movements: ____________ Start time: ____________ Finish time:  ____________ Date: ____________ Movements: ____________ Start time: ____________ Finish time: ____________ Date: ____________ Movements: ____________ Start time: ____________ Finish time: ____________ Date: ____________ Movements: ____________ Start time: ____________ Finish time: ____________  Date: ____________ Movements: ____________ Start time: ____________ Finish time: ____________ Date: ____________ Movements: ____________ Start time: ____________ Finish time: ____________ Date: ____________ Movements: ____________ Start time: ____________ Finish time: ____________ Date: ____________ Movements: ____________ Start time: ____________ Finish time: ____________ Date: ____________ Movements: ____________ Start time: ____________ Finish time: ____________ Date: ____________ Movements: ____________ Start time: ____________ Finish time: ____________ Date: ____________ Movements: ____________ Start time: ____________ Finish time: ____________  Date: ____________ Movements: ____________ Start time: ____________ Finish time: ____________ Date: ____________ Movements: ____________ Start time: ____________ Finish time: ____________ Date: ____________ Movements: ____________ Start time: ____________ Finish time: ____________ Date: ____________ Movements: ____________ Start time: ____________ Finish time: ____________ Date: ____________ Movements: ____________ Start time: ____________ Finish time: ____________ Date: ____________ Movements: ____________ Start time: ____________ Finish time: ____________ Date: ____________ Movements: ____________ Start time: ____________ Finish time: ____________  Date: ____________ Movements: ____________ Start time: ____________ Finish time: ____________ Date: ____________ Movements: ____________ Start time: ____________ Finish time: ____________ Date: ____________ Movements: ____________ Start time: ____________ Finish time: ____________ Date: ____________ Movements:  ____________ Start time: ____________ Finish time: ____________ Date: ____________ Movements: ____________ Start time: ____________ Finish time: ____________ Date: ____________ Movements: ____________ Start time: ____________ Finish time: ____________ Date: ____________ Movements: ____________ Start time: ____________ Finish time: ____________  Date: ____________ Movements: ____________ Start time: ____________ Finish time: ____________ Date: ____________ Movements: ____________ Start time: ____________ Finish time: ____________ Date: ____________ Movements: ____________ Start time:   ____________ Finish time: ____________ Date: ____________ Movements: ____________ Start time: ____________ Finish time: ____________ Date: ____________ Movements: ____________ Start time: ____________ Finish time: ____________ Date: ____________ Movements: ____________ Start time: ____________ Finish time: ____________ Date: ____________ Movements: ____________ Start time: ____________ Finish time: ____________  Date: ____________ Movements: ____________ Start time: ____________ Finish time: ____________ Date: ____________ Movements: ____________ Start time: ____________ Finish time: ____________ Date: ____________ Movements: ____________ Start time: ____________ Finish time: ____________ Date: ____________ Movements: ____________ Start time: ____________ Finish time: ____________ Date: ____________ Movements: ____________ Start time: ____________ Finish time: ____________ Date: ____________ Movements: ____________ Start time: ____________ Finish time: ____________ Date: ____________ Movements: ____________ Start time: ____________ Finish time: ____________  Date: ____________ Movements: ____________ Start time: ____________ Finish time: ____________ Date: ____________ Movements: ____________ Start time: ____________ Finish time: ____________ Date: ____________ Movements: ____________ Start time: ____________ Finish  time: ____________ Date: ____________ Movements: ____________ Start time: ____________ Finish time: ____________ Date: ____________ Movements: ____________ Start time: ____________ Finish time: ____________ Date: ____________ Movements: ____________ Start time: ____________ Finish time: ____________ Date: ____________ Movements: ____________ Start time: ____________ Finish time: ____________  Date: ____________ Movements: ____________ Start time: ____________ Finish time: ____________ Date: ____________ Movements: ____________ Start time: ____________ Finish time: ____________ Date: ____________ Movements: ____________ Start time: ____________ Finish time: ____________ Date: ____________ Movements: ____________ Start time: ____________ Finish time: ____________ Date: ____________ Movements: ____________ Start time: ____________ Finish time: ____________ Date: ____________ Movements: ____________ Start time: ____________ Finish time: ____________ Document Released: 08/26/2006 Document Revised: 07/16/2011 Document Reviewed: 02/26/2009 Exit[employer] Patient Information 2012 [employer]. 

## 2011-11-24 NOTE — Assessment & Plan Note (Signed)
[place] q 4 weeks BPP or NST twice weekly start at 32 weeks

## 2011-11-24 NOTE — Progress Notes (Signed)
[place] SIUP vtx G1 placenta posterior location.  EFW 3-14 AFI 12.0 BPP8/8 FBS 86-102 2hr pp82 -132 GBS not done yet Fetal kick counts reviewed Most blood sugars are in the normal range Labor reviewed with pt All patients  questions answered BPP@[place]  Pt says she can no longer work because of the hours(2nd shift) .  She is unable to take breaks to eat, check her blood sugars or many bathroom breaks

## 2011-11-25 ENCOUNTER — Other Ambulatory Visit: Payer: Self-pay | Admitting: Obstetrics and Gynecology

## 2011-11-25 ENCOUNTER — Ambulatory Visit (HOSPITAL_COMMUNITY)
Admission: RE | Admit: 2011-11-25 | Discharge: 2011-11-25 | Disposition: A | Discharge status: Home / Self Care | Payer: Medicaid Other | Source: Ambulatory Visit | Attending: Obstetrics and Gynecology | Admitting: Obstetrics and Gynecology

## 2011-11-25 VITALS — BP 110/74 | HR 95 | Wt 238.0 lb

## 2011-11-25 DIAGNOSIS — D259 Leiomyoma of uterus, unspecified: Secondary | ICD-10-CM

## 2011-11-25 DIAGNOSIS — O99210 Obesity complicating pregnancy, unspecified trimester: Secondary | ICD-10-CM | POA: Insufficient documentation

## 2011-11-25 DIAGNOSIS — O3410 Maternal care for benign tumor of corpus uteri, unspecified trimester: Secondary | ICD-10-CM | POA: Insufficient documentation

## 2011-11-25 DIAGNOSIS — O99891 Other specified diseases and conditions complicating pregnancy: Secondary | ICD-10-CM

## 2011-11-25 DIAGNOSIS — O365990 Maternal care for other known or suspected poor fetal growth, unspecified trimester, not applicable or unspecified: Secondary | ICD-10-CM

## 2011-11-25 DIAGNOSIS — E669 Obesity, unspecified: Secondary | ICD-10-CM | POA: Insufficient documentation

## 2011-11-25 DIAGNOSIS — O99810 Abnormal glucose complicating pregnancy: Secondary | ICD-10-CM | POA: Insufficient documentation

## 2011-11-25 DIAGNOSIS — O3440 Maternal care for other abnormalities of cervix, unspecified trimester: Secondary | ICD-10-CM | POA: Insufficient documentation

## 2011-12-02 DIAGNOSIS — E669 Obesity, unspecified: Secondary | ICD-10-CM

## 2011-12-02 DIAGNOSIS — Z8619 Personal history of other infectious and parasitic diseases: Secondary | ICD-10-CM

## 2011-12-02 DIAGNOSIS — Z87898 Personal history of other specified conditions: Secondary | ICD-10-CM

## 2011-12-02 DIAGNOSIS — J45998 Other asthma: Secondary | ICD-10-CM

## 2011-12-02 DIAGNOSIS — D219 Benign neoplasm of connective and other soft tissue, unspecified: Secondary | ICD-10-CM

## 2011-12-03 ENCOUNTER — Ambulatory Visit (INDEPENDENT_AMBULATORY_CARE_PROVIDER_SITE_OTHER): Payer: Medicaid Other | Admitting: Obstetrics and Gynecology

## 2011-12-03 ENCOUNTER — Encounter: Payer: Self-pay | Admitting: Obstetrics and Gynecology

## 2011-12-03 ENCOUNTER — Other Ambulatory Visit: Payer: Self-pay | Admitting: Obstetrics and Gynecology

## 2011-12-03 ENCOUNTER — Ambulatory Visit (INDEPENDENT_AMBULATORY_CARE_PROVIDER_SITE_OTHER): Payer: Medicaid Other

## 2011-12-03 VITALS — BP 112/78 | Wt 238.0 lb

## 2011-12-03 DIAGNOSIS — Z331 Pregnant state, incidental: Secondary | ICD-10-CM

## 2011-12-03 DIAGNOSIS — O43899 Other placental disorders, unspecified trimester: Secondary | ICD-10-CM

## 2011-12-03 DIAGNOSIS — O365990 Maternal care for other known or suspected poor fetal growth, unspecified trimester, not applicable or unspecified: Secondary | ICD-10-CM

## 2011-12-03 DIAGNOSIS — O368990 Maternal care for other specified fetal problems, unspecified trimester, not applicable or unspecified: Secondary | ICD-10-CM

## 2011-12-03 LAB — US OB FOLLOW UP

## 2011-12-03 NOTE — Progress Notes (Signed)
FBS 84-105 (mostly normal). PC BS: 75-147 (mostly normal) [place]: SIUP,vertex, Nl fluid, Cx 3.04 cm, 31 weeks (18%)  Feels OK. RTO 1 week. ? Need for dopplers at [facility] on 12/14/11. AVS

## 2011-12-03 NOTE — Progress Notes (Signed)
Pt states she is doing well and has no concerns.

## 2011-12-10 ENCOUNTER — Other Ambulatory Visit: Payer: Self-pay | Admitting: Obstetrics and Gynecology

## 2011-12-10 ENCOUNTER — Ambulatory Visit (INDEPENDENT_AMBULATORY_CARE_PROVIDER_SITE_OTHER): Payer: Medicaid Other

## 2011-12-10 ENCOUNTER — Ambulatory Visit (INDEPENDENT_AMBULATORY_CARE_PROVIDER_SITE_OTHER): Payer: Medicaid Other | Admitting: Obstetrics and Gynecology

## 2011-12-10 ENCOUNTER — Encounter: Payer: Self-pay | Admitting: Obstetrics and Gynecology

## 2011-12-10 DIAGNOSIS — O365990 Maternal care for other known or suspected poor fetal growth, unspecified trimester, not applicable or unspecified: Secondary | ICD-10-CM

## 2011-12-10 DIAGNOSIS — O24419 Gestational diabetes mellitus in pregnancy, unspecified control: Secondary | ICD-10-CM

## 2011-12-10 DIAGNOSIS — O99810 Abnormal glucose complicating pregnancy: Secondary | ICD-10-CM

## 2011-12-10 DIAGNOSIS — Z331 Pregnant state, incidental: Secondary | ICD-10-CM

## 2011-12-10 LAB — US OB DETAIL + 14 WK

## 2011-12-10 LAB — US OB FOLLOW UP

## 2011-12-10 MED ORDER — CONCEPT DHA 53.5-38-1 MG PO CAPS: ORAL_CAPSULE | ORAL | Status: DC

## 2011-12-10 MED ORDER — ACCU-CHEK MULTICLIX LANCETS MISC: Status: DC

## 2011-12-10 MED ORDER — BLOOD GLUC METER DISP-STRIPS DEVI: Status: DC

## 2011-12-10 NOTE — Progress Notes (Signed)
Known SGA and GDM diet-controlled   FBS usually <90 other values normal Sono today:  EFW  4 lbs 11 oz   16%   AFI normal  BPP 8/8 Breech OK to D/C follow-up at MFM

## 2011-12-10 NOTE — Progress Notes (Signed)
Pt. Stated no issues today.  

## 2011-12-14 ENCOUNTER — Ambulatory Visit (HOSPITAL_COMMUNITY): Payer: Medicaid Other | Attending: Obstetrics and Gynecology

## 2011-12-18 ENCOUNTER — Other Ambulatory Visit: Payer: Self-pay | Admitting: Obstetrics and Gynecology

## 2011-12-18 NOTE — Telephone Encounter (Signed)
Vernona Rieger electronic

## 2011-12-21 NOTE — Telephone Encounter (Signed)
Pt states she needs "fastclick" lancets and test strips for Accucheck nano.  Ld  Called to pharmacy

## 2011-12-24 ENCOUNTER — Ambulatory Visit (INDEPENDENT_AMBULATORY_CARE_PROVIDER_SITE_OTHER): Payer: Medicaid Other | Admitting: Obstetrics and Gynecology

## 2011-12-24 VITALS — BP 110/76 | Wt 238.0 lb

## 2011-12-24 DIAGNOSIS — Z331 Pregnant state, incidental: Secondary | ICD-10-CM

## 2011-12-24 DIAGNOSIS — O24419 Gestational diabetes mellitus in pregnancy, unspecified control: Secondary | ICD-10-CM

## 2011-12-24 DIAGNOSIS — O99810 Abnormal glucose complicating pregnancy: Secondary | ICD-10-CM

## 2011-12-24 NOTE — Progress Notes (Signed)
Pt. Stated no issues today.  

## 2011-12-24 NOTE — Progress Notes (Signed)
Diet controlled GDM: cbg reviewed and well controlled GBS, GC, Chlamydia performed today Sono for growth at [facility]

## 2011-12-25 LAB — GC/CHLAMYDIA PROBE AMP, GENITAL: GC Probe Amp, Genital: NEGATIVE

## 2011-12-30 ENCOUNTER — Ambulatory Visit (INDEPENDENT_AMBULATORY_CARE_PROVIDER_SITE_OTHER): Payer: Medicaid Other

## 2011-12-30 ENCOUNTER — Ambulatory Visit (INDEPENDENT_AMBULATORY_CARE_PROVIDER_SITE_OTHER): Payer: Medicaid Other | Admitting: Obstetrics and Gynecology

## 2011-12-30 ENCOUNTER — Other Ambulatory Visit: Payer: Self-pay | Admitting: Obstetrics and Gynecology

## 2011-12-30 DIAGNOSIS — O365990 Maternal care for other known or suspected poor fetal growth, unspecified trimester, not applicable or unspecified: Secondary | ICD-10-CM

## 2011-12-30 DIAGNOSIS — O24419 Gestational diabetes mellitus in pregnancy, unspecified control: Secondary | ICD-10-CM

## 2011-12-30 DIAGNOSIS — O99810 Abnormal glucose complicating pregnancy: Secondary | ICD-10-CM

## 2011-12-30 LAB — US OB FOLLOW UP

## 2011-12-30 NOTE — Progress Notes (Signed)
Ultrasound today showed the baby to weigh 5 pounds and 4 ounces 5th percentile the abdominal circumference was less than 2 point Percentile AFI was normal at 13.3 cm and BPP was 8 at 8 there were normal Dopplers placenta is posterior and the baby is vertex I discussed ultrasound findings with the patient also discussed the risk benefits and alternatives with inducing now vs. Continued testing induction at 39 weeks.  Currently the patient's cervix is unfavorable and she be at increased risk for C-section.  The baby is testing normally and if continues being pregnant over the next 2 weeks Will schedule NST with biophysical profile and Dopplers twice a week.  Will plan for delivery at 39 weeks.  Patient agreeable to antepartum testing and delivery at 39 weeks. Plan for NST biophysical profile and Dopplers on Friday and next Tuesday also next Tuesday with the visit.

## 2012-01-01 ENCOUNTER — Ambulatory Visit (INDEPENDENT_AMBULATORY_CARE_PROVIDER_SITE_OTHER): Payer: Medicaid Other

## 2012-01-01 ENCOUNTER — Encounter: Payer: Self-pay | Admitting: Obstetrics and Gynecology

## 2012-01-01 ENCOUNTER — Ambulatory Visit (INDEPENDENT_AMBULATORY_CARE_PROVIDER_SITE_OTHER): Payer: Medicaid Other | Admitting: Obstetrics and Gynecology

## 2012-01-01 ENCOUNTER — Other Ambulatory Visit: Payer: Self-pay | Admitting: Obstetrics and Gynecology

## 2012-01-01 VITALS — BP 120/74 | Wt 237.0 lb

## 2012-01-01 DIAGNOSIS — O365990 Maternal care for other known or suspected poor fetal growth, unspecified trimester, not applicable or unspecified: Secondary | ICD-10-CM

## 2012-01-01 DIAGNOSIS — IMO0002 Reserved for concepts with insufficient information to code with codable children: Secondary | ICD-10-CM

## 2012-01-01 NOTE — Progress Notes (Signed)
Repeat BP 110/80  Denies headache, vision disturbances or edema.  NST reactive.

## 2012-01-01 NOTE — Progress Notes (Signed)
Ultrasound today showing AFI of 10.9 cm, posterior placenta, vertex presentation, biophysical profile 8 out of 8, normal Dopplers No complaints Will continue twice weekly Dopplers and biophysical profile and AFI with repeat ultrasound after the last estimated fetal weight in 2 weeks 96/5/13) Plan for induction at 39 weeks with the increased risk of C-section Fetal kick counts

## 2012-01-01 NOTE — Progress Notes (Signed)
NST reactive.  Pt returning this afternoon for BPP, dopplers and AFI.

## 2012-01-05 ENCOUNTER — Telehealth: Payer: Self-pay | Admitting: Obstetrics and Gynecology

## 2012-01-05 ENCOUNTER — Other Ambulatory Visit (INDEPENDENT_AMBULATORY_CARE_PROVIDER_SITE_OTHER): Payer: Medicaid Other

## 2012-01-05 ENCOUNTER — Other Ambulatory Visit: Payer: Self-pay | Admitting: Obstetrics and Gynecology

## 2012-01-05 DIAGNOSIS — IMO0002 Reserved for concepts with insufficient information to code with codable children: Secondary | ICD-10-CM

## 2012-01-05 DIAGNOSIS — O365990 Maternal care for other known or suspected poor fetal growth, unspecified trimester, not applicable or unspecified: Secondary | ICD-10-CM

## 2012-01-05 NOTE — Telephone Encounter (Signed)
TC from pt.  Is able to come today for U/S.

## 2012-01-05 NOTE — Telephone Encounter (Signed)
TC to pt's mother.  Requested she have pt contact office ASAP if she speaks with her.

## 2012-01-07 ENCOUNTER — Other Ambulatory Visit: Payer: Self-pay

## 2012-01-07 ENCOUNTER — Telehealth: Payer: Self-pay

## 2012-01-07 NOTE — Telephone Encounter (Signed)
Spoke to L.J. Up front to make she that this pt gets scheduled for NST, BPP, AFI, Dopplers and EFW on 01/12/2012 and visit w/ provider. She states she will get pt scheduled. Melody Comas A

## 2012-01-08 ENCOUNTER — Encounter (HOSPITAL_COMMUNITY): Payer: Self-pay | Admitting: *Deleted

## 2012-01-08 ENCOUNTER — Encounter: Payer: Self-pay | Admitting: Obstetrics and Gynecology

## 2012-01-08 ENCOUNTER — Ambulatory Visit (INDEPENDENT_AMBULATORY_CARE_PROVIDER_SITE_OTHER): Payer: Medicaid Other

## 2012-01-08 ENCOUNTER — Other Ambulatory Visit: Payer: Medicaid Other

## 2012-01-08 ENCOUNTER — Telehealth (HOSPITAL_COMMUNITY): Payer: Self-pay | Admitting: *Deleted

## 2012-01-08 ENCOUNTER — Other Ambulatory Visit: Payer: Self-pay | Admitting: Obstetrics and Gynecology

## 2012-01-08 ENCOUNTER — Ambulatory Visit (INDEPENDENT_AMBULATORY_CARE_PROVIDER_SITE_OTHER): Payer: Medicaid Other | Admitting: Obstetrics and Gynecology

## 2012-01-08 VITALS — BP 106/78 | Wt 239.0 lb

## 2012-01-08 DIAGNOSIS — IMO0002 Reserved for concepts with insufficient information to code with codable children: Secondary | ICD-10-CM

## 2012-01-08 DIAGNOSIS — Z331 Pregnant state, incidental: Secondary | ICD-10-CM

## 2012-01-08 DIAGNOSIS — O365990 Maternal care for other known or suspected poor fetal growth, unspecified trimester, not applicable or unspecified: Secondary | ICD-10-CM

## 2012-01-08 LAB — US OB FOLLOW UP

## 2012-01-08 NOTE — Progress Notes (Signed)
fbs 71-93 2hr pp 80-120 EFW:  2742grams   6-8#,   21% AFI:10.3cm Positionvtx Placenta location: post, grade  Bpp8/8 Normal dopplers Pt without c/o For induction 6/3 Reviewed induction with risk of c/s and may take more than one day.  Pt voiced understanding

## 2012-01-08 NOTE — Telephone Encounter (Signed)
Preadmission screen  

## 2012-01-11 ENCOUNTER — Encounter (HOSPITAL_COMMUNITY): Payer: Self-pay

## 2012-01-11 ENCOUNTER — Encounter: Payer: Self-pay | Admitting: Obstetrics and Gynecology

## 2012-01-11 ENCOUNTER — Inpatient Hospital Stay (HOSPITAL_COMMUNITY)
Admission: RE | Admit: 2012-01-11 | Discharge: 2012-01-17 | DRG: 775 | Disposition: A | Discharge status: Home / Self Care | Payer: Medicaid Other | Source: Ambulatory Visit | Attending: Obstetrics and Gynecology | Admitting: Obstetrics and Gynecology

## 2012-01-11 VITALS — BP 113/60 | HR 81 | Temp 97.6°F | Resp 18 | Ht 63.0 in | Wt 239.0 lb

## 2012-01-11 DIAGNOSIS — O99892 Other specified diseases and conditions complicating childbirth: Secondary | ICD-10-CM | POA: Diagnosis present

## 2012-01-11 DIAGNOSIS — O99814 Abnormal glucose complicating childbirth: Principal | ICD-10-CM | POA: Diagnosis present

## 2012-01-11 DIAGNOSIS — Z22330 Carrier of Group B streptococcus: Secondary | ICD-10-CM

## 2012-01-11 DIAGNOSIS — O24419 Gestational diabetes mellitus in pregnancy, unspecified control: Secondary | ICD-10-CM

## 2012-01-11 DIAGNOSIS — Z789 Other specified health status: Secondary | ICD-10-CM

## 2012-01-11 DIAGNOSIS — O365990 Maternal care for other known or suspected poor fetal growth, unspecified trimester, not applicable or unspecified: Secondary | ICD-10-CM | POA: Diagnosis present

## 2012-01-11 DIAGNOSIS — Z3490 Encounter for supervision of normal pregnancy, unspecified, unspecified trimester: Secondary | ICD-10-CM

## 2012-01-11 HISTORY — DX: Other specified health status: Z78.9

## 2012-01-11 LAB — CBC
MCV: 80.9 fL (ref 78.0–100.0)
Platelets: 190 10*3/uL (ref 150–400)
RBC: 4.88 MIL/uL (ref 3.87–5.11)
RDW: 16.1 % — ABNORMAL HIGH (ref 11.5–15.5)
WBC: 7.3 10*3/uL (ref 4.0–10.5)

## 2012-01-11 LAB — COMPREHENSIVE METABOLIC PANEL
ALT: 12 U/L (ref 0–35)
AST: 18 U/L (ref 0–37)
Albumin: 3.2 g/dL — ABNORMAL LOW (ref 3.5–5.2)
Alkaline Phosphatase: 105 U/L (ref 39–117)
Chloride: 104 mEq/L (ref 96–112)
Potassium: 4 mEq/L (ref 3.5–5.1)
Sodium: 136 mEq/L (ref 135–145)
Total Bilirubin: 0.2 mg/dL — ABNORMAL LOW (ref 0.3–1.2)
Total Protein: 6.6 g/dL (ref 6.0–8.3)

## 2012-01-11 MED ORDER — ACETAMINOPHEN 325 MG PO TABS: 650.0000 mg | ORAL_TABLET | ORAL | Status: DC | PRN

## 2012-01-11 MED ORDER — OXYTOCIN 20 UNITS IN LACTATED RINGERS INFUSION - SIMPLE: 125.0000 mL/h | Freq: Once | INTRAVENOUS | Status: DC

## 2012-01-11 MED ORDER — LACTATED RINGERS IV SOLN: 500.0000 mL | INTRAVENOUS | Status: DC | PRN

## 2012-01-11 MED ORDER — LIDOCAINE HCL (PF) 1 % IJ SOLN
30.0000 mL | INTRAMUSCULAR | Status: DC | PRN
  Administered 2012-01-15: 30 mL via SUBCUTANEOUS
  Filled 2012-01-11: qty 30

## 2012-01-11 MED ORDER — OXYTOCIN 20 UNITS IN LACTATED RINGERS INFUSION - SIMPLE
1.0000 m[IU]/min | INTRAVENOUS | Status: DC
  Administered 2012-01-12: 1 m[IU]/min via INTRAVENOUS
  Administered 2012-01-14: 12 m[IU]/min via INTRAVENOUS
  Filled 2012-01-11 (×2): qty 1000

## 2012-01-11 MED ORDER — OXYCODONE-ACETAMINOPHEN 5-325 MG PO TABS: 1.0000 | ORAL_TABLET | ORAL | Status: DC | PRN

## 2012-01-11 MED ORDER — LACTATED RINGERS IV SOLN
INTRAVENOUS | Status: DC
  Administered 2012-01-12 – 2012-01-15 (×3): via INTRAVENOUS

## 2012-01-11 MED ORDER — HYDROXYZINE HCL 50 MG PO TABS: 50.0000 mg | ORAL_TABLET | Freq: Four times a day (QID) | ORAL | Status: DC | PRN

## 2012-01-11 MED ORDER — OXYTOCIN BOLUS FROM INFUSION
500.0000 mL | Freq: Once | INTRAVENOUS | Status: DC
  Filled 2012-01-11: qty 500
  Filled 2012-01-11: qty 1000

## 2012-01-11 MED ORDER — ZOLPIDEM TARTRATE 10 MG PO TABS
10.0000 mg | ORAL_TABLET | Freq: Every evening | ORAL | Status: DC | PRN
  Administered 2012-01-11: 10 mg via ORAL
  Filled 2012-01-11 (×2): qty 1

## 2012-01-11 MED ORDER — HYDROXYZINE HCL 50 MG/ML IM SOLN: 50.0000 mg | Freq: Four times a day (QID) | INTRAMUSCULAR | Status: DC | PRN

## 2012-01-11 MED ORDER — FLEET ENEMA 7-19 GM/118ML RE ENEM: 1.0000 | ENEMA | RECTAL | Status: DC | PRN

## 2012-01-11 MED ORDER — MISOPROSTOL 25 MCG QUARTER TABLET
25.0000 ug | ORAL_TABLET | ORAL | Status: DC | PRN
  Administered 2012-01-11 – 2012-01-12 (×2): 25 ug via VAGINAL
  Filled 2012-01-11 (×2): qty 0.25

## 2012-01-11 MED ORDER — IBUPROFEN 600 MG PO TABS: 600.0000 mg | ORAL_TABLET | Freq: Four times a day (QID) | ORAL | Status: DC | PRN

## 2012-01-11 MED ORDER — ONDANSETRON HCL 4 MG/2ML IJ SOLN: 4.0000 mg | Freq: Four times a day (QID) | INTRAMUSCULAR | Status: DC | PRN

## 2012-01-11 MED ORDER — SODIUM CHLORIDE 0.9 % IJ SOLN: 3.0000 mL | INTRAMUSCULAR | Status: DC | PRN

## 2012-01-11 MED ORDER — SODIUM CHLORIDE 0.9 % IV SOLN: 250.0000 mL | INTRAVENOUS | Status: DC | PRN

## 2012-01-11 MED ORDER — FENTANYL CITRATE 0.05 MG/ML IJ SOLN: 100.0000 ug | INTRAMUSCULAR | Status: DC | PRN

## 2012-01-11 MED ORDER — TERBUTALINE SULFATE 1 MG/ML IJ SOLN: 0.2500 mg | Freq: Once | INTRAMUSCULAR | Status: AC | PRN

## 2012-01-11 MED ORDER — SODIUM CHLORIDE 0.9 % IJ SOLN: 3.0000 mL | Freq: Two times a day (BID) | INTRAMUSCULAR | Status: DC

## 2012-01-11 MED ORDER — CITRIC ACID-SODIUM CITRATE 334-500 MG/5ML PO SOLN
30.0000 mL | ORAL | Status: DC | PRN
  Administered 2012-01-15: 30 mL via ORAL
  Filled 2012-01-11: qty 15

## 2012-01-11 NOTE — H&P (Signed)
Brenda [name] is a 26 y.o. obese black female presenting at 38.6 weeks per [facility] 01/19/12 for term induction of labor secondary to GDM and SGA.  Pt denies any UTI or PIH s/s.  GFM. Denies LOF or VB.  No recent illness or fever.  C/o intermittent heartburn which is helped usually by Tums.  Pt last had u/s 01/08/12 and EFW=6+8 (20%); AUA=35.3 and AFI=10.3 (30%), Vtx, with normal dopplers.   Accompanied to [facility] by her parents and s.o.    Prenatal Course: Pt initiated care at [facility] and transferred her care to [facility] around 16 weeks.  She had elevated 1hr gtt=160 and abnl 3hr gtt subsequently; her GDM has been diet-controlled and she has checked cbg's 4x daily.  She had normal anatomy u/s and negative 1st trimester and AFP screens.  Serial growth u/s have been followed in collaboration with MFM for SGA since around 24 weeks.  AC lag was noted at 7% on 2/13.  F/u u/s on 3/6 showed HC & FL lag<3%.  Overall growth has been SGA.  She has had normal pregnancy discomforts.   . Maternal Medical History:  Contractions: Frequency: rare.   Perceived severity is mild.    Fetal activity: Perceived fetal activity is normal.   Last perceived fetal movement was within the past hour.    Prenatal complications: 1.  GDM--diet controlled 2.  Obese 3.  Rubella non-immune 4.  GBS positive 5.  H/o chlamydia 6.  H/o abnl pap 7.  Fibroids 8.  Seasonal asthma 9.  SGA w/ h/o AC lag in Feb & FL & HC lag in March 10.  Transfer from [facility] at 16 weeks  Prenatal Complications - Diabetes: gestational. Diabetes is managed by diet.      OB History    Grav Para Term Preterm Abortions TAB SAB Ect Mult Living   1 0 0 0 0 0 0 0 0 0      Past Medical History  Diagnosis Date  . Asthma   . H/O chlamydia infection 2010  . H/O varicella   . Abnormal Pap smear 2007    colpo  . Obese   . Fibroid   . Rubella non-immune status 01/11/2012   Past Surgical History  Procedure Date  . No past surgeries    Family History: family  history includes Alcohol abuse in her father; Asthma in her cousin; Cancer in her cousin; Heart disease in her cousin, maternal aunt, and maternal grandmother; Hypertension in her father, maternal grandmother, mother, and paternal grandmother; Learning disabilities in her cousin; Mental illness in her maternal aunt; and Thrombophlebitis in her maternal grandmother.  There is no history of Anesthesia problems. Social History:  reports that she has never smoked. She has never used smokeless tobacco. She reports that she does not drink alcohol or use illicit drugs.  Review of Systems  Constitutional: Negative.   HENT: Negative.   Eyes: Negative.   Respiratory: Negative.   Cardiovascular: Negative.   Gastrointestinal: Positive for heartburn.  Genitourinary: Negative.   Skin: Negative.   Neurological: Negative.     Dilation: 1 Effacement (%): 70 Station: 0 Exam by:: Hilary Kairah Leoni CNM Blood pressure 120/64, pulse 95, temperature 98 F (36.7 C), temperature source Oral, resp. rate 20, height 5\' 3"  (1.6 m), weight 239 lb (108.41 kg), last menstrual period 04/14/2011. .. Results for orders placed during the hospital encounter of 01/11/12 (from the past 24 hour(s))  CBC     Status: Abnormal   Collection Time  01/11/12  8:05 PM      Component Value Range   WBC 7.3  4.0 - 10.5 (K/uL)   RBC 4.88  3.87 - 5.11 (MIL/uL)   Hemoglobin 13.1  12.0 - 15.0 (g/dL)   HCT 16.1  09.6 - 04.5 (%)   MCV 80.9  78.0 - 100.0 (fL)   MCH 26.8  26.0 - 34.0 (pg)   MCHC 33.2  30.0 - 36.0 (g/dL)   RDW 40.9 (*) 81.1 - 15.5 (%)   Platelets 190  150 - 400 (K/uL)  COMPREHENSIVE METABOLIC PANEL     Status: Abnormal   Collection Time   01/11/12  8:05 PM      Component Value Range   Sodium 136  135 - 145 (mEq/L)   Potassium 4.0  3.5 - 5.1 (mEq/L)   Chloride 104  96 - 112 (mEq/L)   CO2 21  19 - 32 (mEq/L)   Glucose, Bld 83  70 - 99 (mg/dL)   BUN 10  6 - 23 (mg/dL)   Creatinine, Ser 9.14  0.50 - 1.10 (mg/dL)    Calcium 78.2  8.4 - 10.5 (mg/dL)   Total Protein 6.6  6.0 - 8.3 (g/dL)   Albumin 3.2 (*) 3.5 - 5.2 (g/dL)   AST 18  0 - 37 (U/L)   ALT 12  0 - 35 (U/L)   Alkaline Phosphatase 105  39 - 117 (U/L)   Total Bilirubin 0.2 (*) 0.3 - 1.2 (mg/dL)   GFR calc non Af Amer >90  >90 (mL/min)   GFR calc Af Amer >90  >90 (mL/min)  LACTATE DEHYDROGENASE     Status: Normal   Collection Time   01/11/12  8:05 PM      Component Value Range   LDH 223  94 - 250 (U/L)  URIC ACID     Status: Normal   Collection Time   01/11/12  8:05 PM      Component Value Range   Uric Acid, Serum 4.9  2.4 - 7.0 (mg/dL)  GLUCOSE, CAPILLARY     Status: Normal   Collection Time   01/11/12  8:32 PM      Component Value Range   Glucose-Capillary 87  70 - 99 (mg/dL)  .Marland Kitchen Filed Vitals:   01/11/12 1956 01/11/12 2000 01/11/12 2039 01/11/12 2217  BP:  141/91 129/79 120/64  Pulse:  103 105 95  Temp:    98 F (36.7 C)  TempSrc:      Resp:  18 20 20   Height: 5\' 3"  (1.6 m)     Weight: 239 lb (108.41 kg)      Maternal Exam:  Uterine Assessment: Contraction strength is mild.  Contraction frequency is irregular.  UC's q 3-5 on admission  Abdomen: Patient reports no abdominal tenderness. Estimated fetal weight is 6+8 Friday 01/08/12.   Fetal presentation: vertex  Introitus: Normal vulva. Pelvis: adequate for delivery.   Cervix: Cervix evaluated by digital exam.     Fetal Exam Fetal Monitor Review: Mode: ultrasound.   Baseline rate: 140.  Variability: moderate (6-25 bpm).   Pattern: accelerations present and no decelerations.    Fetal State Assessment: Category I - tracings are normal.     Physical Exam  Constitutional: She is oriented to person, place, and time. She appears well-developed and well-nourished. No distress.       Nervous, but appropriate; shaking at times before cervical exams  HENT:  Head: Normocephalic.  Eyes: Pupils are equal, round, and reactive to light.  Cardiovascular: Normal rate.   Respiratory:  Effort normal.  GI: Soft.       gravid  Musculoskeletal: She exhibits edema.       Trace-mild nonpitting generalized edema BLE  Neurological: She is alert and oriented to person, place, and time.       No clonus; DTR's 1+  Skin: Skin is warm and dry.  Psychiatric: She has a normal mood and affect. Her behavior is normal. Judgment and thought content normal.    Prenatal labs: ABO, Rh: B/Positive/-- (12/27 0000) Antibody: Negative (12/05 0000) Rubella: Nonimmune (12/05 0000) RPR: Nonreactive (12/05 0000)  HBsAg: Negative (12/05 0000)  HIV: Non-reactive (12/05 0000)  GBS: POSITIVE (05/16 1423)   Assessment/Plan: 1.  38.6 weeks 2. GDM--diet controlled 3.  SGA (h/o AC, FL, & HC lags in third trimester) 4.  GBS positive 5.  Cat I FHT 6.  One elevated BP on arrival; normal since  1.  Admit to BS with Dr. Estanislado Pandy as attending 2.  Routine L&D orders 3.  PCN when in labor, at start of Pitocin, or w/ SROM 4.  Will proceed w/ Cytotec at present per c/w Dr. Estanislado Pandy; transition to Pitocin prn 5.  CBG's q 4 hrs in labor 6.  C/w MD prn; support as needed  Shaneisha Burkel H 01/11/2012, 11:47 PM

## 2012-01-12 ENCOUNTER — Other Ambulatory Visit: Payer: Medicaid Other

## 2012-01-12 ENCOUNTER — Ambulatory Visit: Payer: Medicaid Other | Admitting: Obstetrics and Gynecology

## 2012-01-12 LAB — GLUCOSE, CAPILLARY
Glucose-Capillary: 89 mg/dL (ref 70–99)
Glucose-Capillary: 89 mg/dL (ref 70–99)
Glucose-Capillary: 79 mg/dL (ref 70–99)
Glucose-Capillary: 91 mg/dL (ref 70–99)

## 2012-01-12 MED ORDER — MISOPROSTOL 25 MCG QUARTER TABLET
25.0000 ug | ORAL_TABLET | ORAL | Status: DC
  Administered 2012-01-12 – 2012-01-13 (×3): 25 ug via VAGINAL
  Filled 2012-01-12: qty 0.25
  Filled 2012-01-12: qty 1
  Filled 2012-01-12 (×2): qty 0.25

## 2012-01-12 MED ORDER — DEXTROSE 5 % IV SOLN
5.0000 10*6.[IU] | Freq: Once | INTRAVENOUS | Status: AC
  Administered 2012-01-12: 5 10*6.[IU] via INTRAVENOUS
  Filled 2012-01-12: qty 5

## 2012-01-12 MED ORDER — PENICILLIN G POTASSIUM 5000000 UNITS IJ SOLR
2.5000 10*6.[IU] | INTRAVENOUS | Status: DC
  Administered 2012-01-12 (×2): 2.5 10*6.[IU] via INTRAVENOUS
  Filled 2012-01-12 (×5): qty 2.5

## 2012-01-12 NOTE — Progress Notes (Signed)
  Subjective: Comfortable, eating clear liquid tray.  Family at bedside.  Aware of some contractions, but not all.  Objective: BP 117/85  Pulse 92  Temp(Src) 98.2 F (36.8 C) (Oral)  Resp 18  Ht 5\' 3"  (1.6 m)  Wt 239 lb (108.41 kg)  BMI 42.34 kg/m2  LMP 04/14/2011      FHT: Category 1 UC:   regular, every 2-3 minutes Pitocin on 6 mu/min.  Labs: Lab Results  Component Value Date   WBC 7.3 01/11/2012   HGB 13.1 01/11/2012   HCT 39.5 01/11/2012   MCV 80.9 01/11/2012   PLT 190 01/11/2012    Assessment / Plan: Induction of labor due to gestational diabetes,  progressing well on pitocin Will continue to observe.  Nigel Bridgeman, CNM 01/12/2012, 2:14 PM

## 2012-01-12 NOTE — Progress Notes (Signed)
Brenda [name] is a 26 y.o. G1P0000 at [redacted]w[redacted]d admitted for induction of labor due to Gestational diabetes.  Subjective: Pt has slept intermittently overnight.  No requests for pain medicine.  She did receive an Ambien before [place].  Her s.o. Has remained at bedside.  No PIH s/s.  No VB or LOF.  Objective: BP 149/88  Pulse 86  Temp(Src) 98 F (36.7 C) (Oral)  Resp 20  Ht 5\' 3"  (1.6 m)  Wt 239 lb (108.41 kg)  BMI 42.34 kg/m2  LMP 04/14/2011     .Marland Kitchen Filed Vitals:   01/11/12 2000 01/11/12 2039 01/11/12 2217 01/12/12 0109  BP: 141/91 129/79 120/64 149/88  Pulse: 103 105 95 86  Temp:   98 F (36.7 C) 98 F (36.7 C)  TempSrc:    Oral  Resp: 18 20 20 20   Height:      Weight:       FHT:  FHR: 130 bpm, variability: moderate,  accelerations:  Present,  decelerations:  Absent UC:   irregular, every 3-7 minutes SVE:   Dilation: 1 Effacement (%): Thick Station: 0 Exam by:: [employer]: Lab Results  Component Value Date   WBC 7.3 01/11/2012   HGB 13.1 01/11/2012   HCT 39.5 01/11/2012   MCV 80.9 01/11/2012   PLT 190 01/11/2012    Assessment / Plan: 1.  39 weeks  2.  IOL in progress  3.  GDM-diet controlled  4. SGA  5.  GBS pos  Labor: has received 2 doses of cytotec Preeclampsia:  labs stable Fetal Wellbeing:  Category I Pain Control:  Labor support without medications I/D:  n/a Anticipated MOD:  NSVD 1.  Will defer 3rd dose of cytotec and start Pitocin at 1mu before change of shift, along with PCN per GBS protocol.  Will do fasting cbg this AM and then q 4 hr per c/w dr. Estanislado Pandy. 2.  Support as needed; AROM prn further augmentation. Chibuikem Thang H 01/12/2012, 5:28 AM

## 2012-01-12 NOTE — Progress Notes (Signed)
  Subjective: Resting comfortably.  Family at bedside.  Objective: BP 117/85  Pulse 92  Temp(Src) 98.2 F (36.8 C) (Oral)  Resp 18  Ht 5\' 3"  (1.6 m)  Wt 239 lb (108.41 kg)  BMI 42.34 kg/m2  LMP 04/14/2011      FHT:  Category 1 UC:  q 3-5 min, mild.  Labs: Lab Results  Component Value Date   WBC 7.3 01/11/2012   HGB 13.1 01/11/2012   HCT 39.5 01/11/2012   MCV 80.9 01/11/2012   PLT 190 01/11/2012   CBG (last 3)   Basename 01/12/12 1034 01/12/12 0625 01/11/12 2032  GLUCAP 89 89 87       Assessment / Plan: Continue induction for gestational diabetes--will re-evaluate between 5 and 6pm for plan.   Nigel Bridgeman, CNM, [place] 01/12/2012, 2:17 PM

## 2012-01-12 NOTE — Progress Notes (Signed)
Subjective: Pt w/o complaints.  Had CHO modified supper and next cbg at 2100.  Fm remains at [facility] & supportive.  Pitocin and PCN off.    Objective: BP 124/77  Pulse 104  Temp(Src) 98.6 F (37 C) (Oral)  Resp 20  Ht 5\' 3"  (1.6 m)  Wt 239 lb (108.41 kg)  BMI 42.34 kg/m2  LMP 04/14/2011      FHT:  FHR: 135 bpm, variability: moderate,  accelerations:  Present,  decelerations:  Absent UC:   occ'l SVE:   Dilation: 1 Effacement (%): 70 Station: -2 Exam by:: [name]. Brenda [name] cnm Cytotec inserted posterior fornix Labs: .Marland Kitchen Results for orders placed during the hospital encounter of 01/11/12 (from the past 24 hour(s))  GLUCOSE, CAPILLARY     Status: Normal   Collection Time   01/11/12  8:32 PM      Component Value Range   Glucose-Capillary 87  70 - 99 (mg/dL)  GLUCOSE, CAPILLARY     Status: Normal   Collection Time   01/12/12  6:25 AM      Component Value Range   Glucose-Capillary 89  70 - 99 (mg/dL)  GLUCOSE, CAPILLARY     Status: Normal   Collection Time   01/12/12 10:34 AM      Component Value Range   Glucose-Capillary 89  70 - 99 (mg/dL)  GLUCOSE, CAPILLARY     Status: Normal   Collection Time   01/12/12  2:31 PM      Component Value Range   Glucose-Capillary 79  70 - 99 (mg/dL)   Assessment / Plan: 1.  39 weeks  2.  GDM-diet  3.  GBS pos  4.  SGA  5.  No cervical change since admission 24 hrs ago  Labor: serial induction Preeclampsia:  no signs or symptoms of toxicity Fetal Wellbeing:  Category I Pain Control:  Labor support without medications I/D:  n/a Anticipated MOD:  NSVD 1.  Continue cytotec tonight; Pitocin prn.  Restart PCN prn labor or SROM.  D/c cbg's tonight and restart q4hr in labor. 2.  Dr. Su Hilt aware of POC; c/w MD prn.  Brenda [name] 01/12/2012, 8:16 PM

## 2012-01-12 NOTE — Progress Notes (Signed)
Subjective: Reports having felt some ctxs.  No c/o's.  Enc'd pt to call and get sugar-free clear tray this morning.    Objective: BP 149/88  Pulse 86  Temp(Src) 98 F (36.7 C) (Oral)  Resp 20  Ht 5\' 3"  (1.6 m)  Wt 239 lb (108.41 kg)  BMI 42.34 kg/m2  LMP 04/14/2011      FHT:  FHR: 135 bpm, variability: moderate,  accelerations:  Present,  decelerations:  Absent UC:   irregular, every 3-7 minutes SVE:   1/75/-2 to -1 posterior Labs: Lab Results  Component Value Date   WBC 7.3 01/11/2012   HGB 13.1 01/11/2012   HCT 39.5 01/11/2012   MCV 80.9 01/11/2012   PLT 190 01/11/2012   CBG=89 this AM  Assessment / Plan: 1.  39 weeks 2.  GDM-diet  3.  SGA  4. GBS pos  5.  two elevated BP's since admission w/ Nml PIH labs  Labor: induction in progress; little cervical change overnight Preeclampsia:  labs stable Fetal Wellbeing:  Category I Pain Control:  Labor support without medications I/D:  n/a Anticipated MOD:  NSVD 1.  Pitocin and PCn now started 2.  Support as needed. 3.  C/w MD prn Allure Greaser H 01/12/2012, 6:38 AM

## 2012-01-12 NOTE — Progress Notes (Signed)
  Subjective: Some contractions are stronger than others.  Objective: BP 121/74  Pulse 83  Temp(Src) 98.1 F (36.7 C) (Axillary)  Resp 18  Ht 5\' 3"  (1.6 m)  Wt 239 lb (108.41 kg)  BMI 42.34 kg/m2  LMP 04/14/2011      FHT:  Category 1 UC:  q 3 min, mild/moderate SVE:   Dilation: 1 Effacement (%): 70 Station: -2 Exam by:: [name], cnm Cervix unchanged--posterior.   CBG (last 3)   Basename 01/12/12 1431 01/12/12 1034 01/12/12 0625  GLUCAP 79 89 89      Assessment / Plan: IUP at [redacted] weeks Gestational diabetes SGA infant GBS positive Induction--latent phase labor  Plan: Stop pitocin.  Plan cytotech tonight. Patient may eat--carb modified diet. 2 hour pp CBGs and FBS in am. Stop PCN.  Restart in am with pitocin re-start or with onset of labor.   Nigel Bridgeman, CNM, [place] 01/12/2012, 6:08 PM

## 2012-01-13 ENCOUNTER — Encounter (HOSPITAL_COMMUNITY): Payer: Self-pay

## 2012-01-13 LAB — CBC
HCT: 39.2 % (ref 36.0–46.0)
Hemoglobin: 13 g/dL (ref 12.0–15.0)
MCH: 26.8 pg (ref 26.0–34.0)
MCHC: 33.2 g/dL (ref 30.0–36.0)
MCV: 80.8 fL (ref 78.0–100.0)
RBC: 4.85 MIL/uL (ref 3.87–5.11)

## 2012-01-13 MED ORDER — PENICILLIN G POTASSIUM 5000000 UNITS IJ SOLR
2.5000 10*6.[IU] | INTRAMUSCULAR | Status: DC
  Administered 2012-01-13 – 2012-01-15 (×11): 2.5 10*6.[IU] via INTRAVENOUS
  Filled 2012-01-13 (×15): qty 2.5

## 2012-01-13 MED ORDER — LACTATED RINGERS IV SOLN: 500.0000 mL | Freq: Once | INTRAVENOUS | Status: DC

## 2012-01-13 MED ORDER — PHENYLEPHRINE 40 MCG/ML (10ML) SYRINGE FOR IV PUSH (FOR BLOOD PRESSURE SUPPORT)
80.0000 ug | PREFILLED_SYRINGE | INTRAVENOUS | Status: DC | PRN
  Filled 2012-01-13: qty 5

## 2012-01-13 MED ORDER — EPHEDRINE 5 MG/ML INJ: 10.0000 mg | INTRAVENOUS | Status: DC | PRN

## 2012-01-13 MED ORDER — DIPHENHYDRAMINE HCL 50 MG/ML IJ SOLN: 12.5000 mg | INTRAMUSCULAR | Status: DC | PRN

## 2012-01-13 MED ORDER — EPHEDRINE 5 MG/ML INJ
10.0000 mg | INTRAVENOUS | Status: DC | PRN
  Filled 2012-01-13: qty 4

## 2012-01-13 MED ORDER — PENICILLIN G POTASSIUM 5000000 UNITS IJ SOLR
5.0000 10*6.[IU] | Freq: Once | INTRAVENOUS | Status: AC
  Administered 2012-01-13: 5 10*6.[IU] via INTRAVENOUS
  Filled 2012-01-13: qty 5

## 2012-01-13 MED ORDER — PHENYLEPHRINE 40 MCG/ML (10ML) SYRINGE FOR IV PUSH (FOR BLOOD PRESSURE SUPPORT): 80.0000 ug | PREFILLED_SYRINGE | INTRAVENOUS | Status: DC | PRN

## 2012-01-13 MED ORDER — FENTANYL 2.5 MCG/ML BUPIVACAINE 1/10 % EPIDURAL INFUSION (WH - ANES)
14.0000 mL/h | INTRAMUSCULAR | Status: DC
  Administered 2012-01-14 – 2012-01-15 (×3): 14 mL/h via EPIDURAL
  Filled 2012-01-13 (×3): qty 60

## 2012-01-13 NOTE — Progress Notes (Signed)
  Subjective: Tolerating foley bulb--feels more crampy since insertion.  Objective: BP 125/82  Pulse 85  Temp(Src) 98 F (36.7 C) (Oral)  Resp 20  Ht 5\' 3"  (1.6 m)  Wt 239 lb (108.41 kg)  BMI 42.34 kg/m2  LMP 04/14/2011      FHT:  Category 1 UC:   q 4 min, mild/mod. Pitocin remains on 12 mu/min Foley still in place.  Assessment / Plan: Induction of labor, now with foley bulb, pitocin Will repeat CBC (last value from 01/11/12).    Nigel Bridgeman, CNM, [place] 01/13/2012, 5:23 PM

## 2012-01-13 NOTE — Progress Notes (Signed)
Subjective: Pt has slept intermittently.  Just received 3rd cytotec dose.  Pt declined Ambien tonight.  No VB or LOF.    Objective: BP 132/79  Pulse 98  Temp(Src) 98.1 F (36.7 C) (Oral)  Resp 16  Ht 5\' 3"  (1.6 m)  Wt 239 lb (108.41 kg)  BMI 42.34 kg/m2  LMP 04/14/2011      FHT:  FHR: 130 bpm, variability: moderate,  accelerations:  Present,  decelerations:  Absent UC:   occ'l ctxs SVE:   Dilation: 1.5 Effacement (%): Thick Station: -2 Exam by:: L.Mears,RN  Labs: Lab Results  Component Value Date   WBC 7.3 01/11/2012   HGB 13.1 01/11/2012   HCT 39.5 01/11/2012   MCV 80.9 01/11/2012   PLT 190 01/11/2012    Assessment / Plan: 1.  39.1  2. GDM-diet  3.  SGA  4. GBS pos  Labor: serial induction; 2nd night of ripening Preeclampsia:  no signs or symptoms of toxicity Fetal Wellbeing:  Category I Pain Control:  Labor support without medications I/D:  n/a Anticipated MOD:  NSVD 1.  Will restart Pitocin and PCN in 4 hrs.  Will let pt eat light breakfast and shower if desires this morning. 2.  C/w MD prn.   Brenda [name] H 01/13/2012, 4:41 AM

## 2012-01-13 NOTE — Progress Notes (Signed)
  Subjective: Slept well, up to shower this am.  Pitocin and PCN restarted around 8:30am.  Patient hopes for progress today.  Objective: BP 133/78  Pulse 87  Temp(Src) 98.3 F (36.8 C) (Oral)  Resp 20  Ht 5\' 3"  (1.6 m)  Wt 239 lb (108.41 kg)  BMI 42.34 kg/m2  LMP 04/14/2011      FHT: Category 1 UC:   Irregular, mild.  SVE:   Dilation: 1.5 Effacement (%): 50 Station: -3 Exam by:: Renaldo Harrison, RN  Pitocin on 2 mu/min.  Labs: Lab Results  Component Value Date   WBC 7.3 01/11/2012   HGB 13.1 01/11/2012   HCT 39.5 01/11/2012   MCV 80.9 01/11/2012   PLT 190 01/11/2012   CBG (last 3)   Basename 01/13/12 0904 01/13/12 0437 01/12/12 2141  GLUCAP 95 75 91       Assessment / Plan: IUP at 39 1/[redacted] weeks Gestational diabetes, diet controlled SGA GBS positive  Will continue pitocin, plan AROM as soon as appropriate.  Brenda [name] 01/13/2012, 9am

## 2012-01-13 NOTE — Progress Notes (Signed)
  Subjective: Aware of some contractions as painful, many just as tightening.    Objective: BP 119/77  Pulse 83  Temp(Src) 98 F (36.7 C) (Oral)  Resp 18  Ht 5\' 3"  (1.6 m)  Wt 239 lb (108.41 kg)  BMI 42.34 kg/m2  LMP 04/14/2011      FHT:  Category 1 UC:   q 2-4 min, mild. SVE:  Tight 1 cm, 75%, vtx -1 On  12 mu/min pitocin. Labs: Lab Results  Component Value Date   WBC 7.3 01/11/2012   HGB 13.1 01/11/2012   HCT 39.5 01/11/2012   MCV 80.9 01/11/2012   PLT 190 01/11/2012    Assessment / Plan: Induction of labor for gestational diabetes, SGA Improved cervical ripeness  Plan: Discussed option of foley bulb insertion to facilitate cervical dilation. Patient agreeable with plan. Foley bulb inserted without difficulty, but patient only able to tolerate 30 cc inflation. Will maintain pitocin at current level until foley bulb falls out, then will anticipate AROM.   Nigel Bridgeman 01/13/2012, 4:01 PM

## 2012-01-14 ENCOUNTER — Encounter (HOSPITAL_COMMUNITY): Payer: Self-pay | Admitting: Anesthesiology

## 2012-01-14 ENCOUNTER — Inpatient Hospital Stay (HOSPITAL_COMMUNITY): Payer: Medicaid Other | Admitting: Anesthesiology

## 2012-01-14 LAB — GLUCOSE, CAPILLARY
Glucose-Capillary: 72 mg/dL (ref 70–99)
Glucose-Capillary: 80 mg/dL (ref 70–99)
Glucose-Capillary: 93 mg/dL (ref 70–99)

## 2012-01-14 MED ORDER — OXYTOCIN 20 UNITS IN LACTATED RINGERS INFUSION - SIMPLE
1.0000 m[IU]/min | INTRAVENOUS | Status: DC
  Administered 2012-01-15: 333 m[IU]/min via INTRAVENOUS

## 2012-01-14 MED ORDER — LIDOCAINE HCL (PF) 1 % IJ SOLN
INTRAMUSCULAR | Status: DC | PRN
  Administered 2012-01-14 (×2): 5 mL

## 2012-01-14 NOTE — Progress Notes (Signed)
  Subjective: Foley fell out--patient reports occasional contractions.  Objective: BP 118/73  Pulse 95  Temp(Src) 97.8 F (36.6 C) (Oral)  Resp 20  Ht 5\' 3"  (1.6 m)  Wt 239 lb (108.41 kg)  BMI 42.34 kg/m2  LMP 04/14/2011      FHT: Category 1 UC:   q 3-4 min, mild. SVE:  3-4 cm, 75%, vtx -2 but well-applied. AROM--clear fluid with bloody show.  Labs: Lab Results  Component Value Date   WBC 6.1 01/13/2012   HGB 13.0 01/13/2012   HCT 39.2 01/13/2012   MCV 80.8 01/13/2012   PLT 181 01/13/2012    Assessment / Plan: Induction of labor, gestational diabetes, SGA S/P foley induction Will observe labor status after AROM--restart pitocin by 1:30 pm if no change in status. Epidural prn.  Nigel Bridgeman 01/14/2012, 12:19 PM

## 2012-01-14 NOTE — Progress Notes (Signed)
Telemetry monitors applied for pt to walk in hall at 1305. Monitors stopped tracing when pt walked into the hall. FHR checked at 1312 and monitors readjusted.

## 2012-01-14 NOTE — Progress Notes (Signed)
  Subjective: Sleeping soundly--has rested well through night.  Foley bulb still in, but at last check, RN reports it was slightly loose, but was not able to remove.  Objective: BP 96/54  Pulse 83  Temp(Src) 97.5 F (36.4 C) (Oral)  Resp 18  Ht 5\' 3"  (1.6 m)  Wt 239 lb (108.41 kg)  BMI 42.34 kg/m2  LMP 04/14/2011      FHT:  Category 1 UC:   Irregular, mild Pitocin remains on 12 mu/min.  Labs: Lab Results  Component Value Date   WBC 6.1 01/13/2012   HGB 13.0 01/13/2012   HCT 39.2 01/13/2012   MCV 80.8 01/13/2012   PLT 181 01/13/2012   CBG (last 3)   Basename 01/14/12 0522 01/14/12 0053 01/13/12 2134  GLUCAP 75 93 93      Assessment / Plan: IUP at 39 2/[redacted] weeks Gestational diabetes, diet controlled GBS--on PCN SGA infant Serial induction--day 3, foley bulb currently in place  Plan: Re-evaluate foley status this am--when removed, plan AROM.   Nigel Bridgeman, CNM, [place] 01/14/2012, 7:31 AM

## 2012-01-14 NOTE — Progress Notes (Signed)
Patient ID: Brenda [name], female   DOB: [birth year], 26 y.o.   MRN: [id number] .Subjective: Feels some ctx but not painful, ate dinner, has just come back from a walk   Objective: BP 110/71  Pulse 93  Temp(Src) 97.5 F (36.4 C) (Oral)  Resp 18  Ht 5\' 3"  (1.6 m)  Wt 239 lb (108.41 kg)  BMI 42.34 kg/m2  LMP 04/14/2011   FHT:  FHR: 130 bpm, variability: moderate,  accelerations:  Present,  decelerations:  Absent tracing MHR at times due to maternal position  UC:   irregular, every 5-10 minutes SVE:   Dilation: 1 Effacement (%):  (75) Station: -3 Exam by:: Emilee Hero, CNM  Foley bulb remains in place, has been pulled on about every hour  Pitocin at 12mu  CBG (last 3)   Basename 01/14/12 0522 01/14/12 0053 01/13/12 2134  GLUCAP 75 93 93      Assessment / Plan: IOL secondary to GDM GBS pos, has rcv'd PCN  Will let pt have breakfast in the AM and then decide on POC    Fetal Wellbeing:  Category I Pain Control:  n/a  Update physician PRN  [name] 01/14/2012, 6:59 AM

## 2012-01-14 NOTE — Progress Notes (Signed)
Patient ID: Brenda [name], female   DOB: 09/08/1985, 26 y.o.   MRN: [id number] .Subjective:  Remains comfortable w epidural, sleeping  Objective: BP 99/51  Pulse 95  Temp(Src) 97.4 F (36.3 C) (Oral)  Resp 18  Ht 5\' 3"  (1.6 m)  Wt 239 lb (108.41 kg)  BMI 42.34 kg/m2  SpO2 99%  LMP 04/14/2011  CBG (last 3)   Basename 01/14/12 2257 01/14/12 2023 01/14/12 1614  GLUCAP 94 72 80      FHT:  FHR: 130 bpm, variability: moderate,  accelerations:  Present,  decelerations:  Absent UC:   regular, every 4-5 minutes SVE:   Dilation: 4 Effacement (%): 80 Station: -2 Exam by:: s. Cyriah Childrey cnm  Pitocin at 13mu MVU's 130  Assessment / Plan: inadaquate MVU's Will continue to increase pitocin by 2mu now VE deferred  GBS pos    Fetal Wellbeing:  Category I Pain Control:  Epidural  Update physician PRN  [name] 01/14/2012, 11:50 PM

## 2012-01-14 NOTE — Progress Notes (Signed)
Patient ID: Brenda [name], female   DOB: 12/13/1985, 26 y.o.   MRN: [id number] .Subjective: Comfortable now w epidural, slightly anxious, family supportive at bs   Objective: BP 118/75  Pulse 86  Temp(Src) 97.5 F (36.4 C) (Oral)  Resp 18  Ht 5\' 3"  (1.6 m)  Wt 239 lb (108.41 kg)  BMI 42.34 kg/m2  SpO2 99%  LMP 04/14/2011  CBG (last 3)   Basename 01/14/12 2023 01/14/12 1614 01/14/12 1159  GLUCAP 72 80 94      FHT:  FHR: 140 bpm, variability: moderate,  accelerations:  Present,  decelerations:  Absent UC:   regular, every 4-6 minutes SVE:   Dilation: 3.5 Effacement (%): 70;80 Station: -2 Exam by:: V. Lathem CNm  VE=4/90/-1 IUPC placed without difficulty  Pitocin at 9mu  Assessment / Plan: serial IOL, now active labor GBS pos, has been rcving PCN GDM - CBG's stable, will check q2h now Titrate pitocin for adequate MVU's,    Fetal Wellbeing:  Category I Pain Control:  Epidural  Update physician PRN  [name] 01/14/2012, 9:04 PM

## 2012-01-14 NOTE — Progress Notes (Signed)
  Subjective: Ambulated for approx 1 hour after AROM, with telemetry monitor in place.  Still leaking small amount of clear fluid, not aware of any contractions.  Objective: BP 116/64  Pulse 93  Temp(Src) 97.8 F (36.6 C) (Oral)  Resp 16  Ht 5\' 3"  (1.6 m)  Wt 239 lb (108.41 kg)  BMI 42.34 kg/m2  SpO2 99%  LMP 04/14/2011      FHT:  Category 1 UC:   Irregular, mild VE deferred--vtx presentation verified by BS [place] prior to re-initiation of pitocin.  Assessment / Plan: Serial induction for gestational diabetes, SGA AROM after foley bulb, minimal contractions Will re-start pitocin now.   Nigel Bridgeman 01/14/2012, 1:56 PM

## 2012-01-14 NOTE — Progress Notes (Signed)
  Subjective: Patient reporting pressure in rectum--currently sitting in rocking chair at bedside, in NAD.  Aware of contractions becoming stronger.  5/10 on pain scale.  Objective: BP 115/80  Pulse 87  Temp(Src) 99.1 F (37.3 C) (Axillary)  Resp 20  Ht 5\' 3"  (1.6 m)  Wt 239 lb (108.41 kg)  BMI 42.34 kg/m2  SpO2 99%  LMP 04/14/2011     FHT:  Category 1 UC:   Not tracing, despite several repositioning attempts for toco SVE:  3+, 70%, vtx, -2.  Attempted to place IUPC--very painful to patient, so attempt stopped. No significant bleeding after attempt, FHR stable. Patient more sensitive during pelvic exams now than previously.  Pitocin on 7 mu/min.  Labs: Lab Results  Component Value Date   WBC 6.1 01/13/2012   HGB 13.0 01/13/2012   HCT 39.2 01/13/2012   MCV 80.8 01/13/2012   PLT 181 01/13/2012   CBG (last 3)   Basename 01/14/12 1614 01/14/12 1159 01/14/12 0853  GLUCAP 80 94 81      Assessment / Plan: Likely inadequate contractions Recommended patient consider proceeding with epidural for comfort and facilitation of examinations. Patient will decide.   Nigel Bridgeman 01/14/2012, 6:41 PM

## 2012-01-14 NOTE — Progress Notes (Signed)
  Subjective: Doing well--feels contractions every 3-5 minutes, but toco doesn't trace well with patient on side.  Pain 5/10.  Objective: BP 121/82  Pulse 88  Temp(Src) 99.1 F (37.3 C) (Axillary)  Resp 20  Ht 5\' 3"  (1.6 m)  Wt 239 lb (108.41 kg)  BMI 42.34 kg/m2  SpO2 99%  LMP 04/14/2011      FHT:  Category 1 UC:   q 3-5 min, mild/moderate Pitocin on 6 mu/min   Labs: Lab Results  Component Value Date   WBC 6.1 01/13/2012   HGB 13.0 01/13/2012   HCT 39.2 01/13/2012   MCV 80.8 01/13/2012   PLT 181 01/13/2012    Assessment / Plan: Continue pitocin augmentation. IUPC with next exam.   Jakobe Blau 01/14/2012, 6:00 PM

## 2012-01-14 NOTE — Anesthesia Procedure Notes (Addendum)
Epidural Patient location during procedure: OB Start time: 01/14/2012 8:02 PM  Staffing Anesthesiologist: Brayton Caves R Performed by: anesthesiologist   Preanesthetic Checklist Completed: patient identified, site marked, surgical consent, pre-op evaluation, timeout performed, IV checked, risks and benefits discussed and monitors and equipment checked  Epidural Patient position: sitting Prep: site prepped and draped and DuraPrep Patient monitoring: continuous pulse ox and blood pressure Approach: midline Injection technique: LOR air and LOR saline  Needle:  Needle type: Tuohy  Needle gauge: 17 G Needle length: 9 cm Needle insertion depth: 7 cm Catheter type: closed end flexible Catheter size: 19 Gauge Catheter at skin depth: 13 cm Test dose: negative  Assessment Events: blood not aspirated, injection not painful, no injection resistance, negative IV test and no paresthesia  Additional Notes Patient identified.  Risk benefits discussed including failed block, incomplete pain control, headache, nerve damage, paralysis, blood pressure changes, nausea, vomiting, reactions to medication both toxic or allergic, and postpartum back pain.  Patient expressed understanding and wished to proceed.  All questions were answered.  Sterile technique used throughout procedure and epidural site dressed with sterile barrier dressing. No paresthesia or other complications noted.The patient did not experience any signs of intravascular injection such as tinnitus or metallic taste in mouth nor signs of intrathecal spread such as rapid motor block. Please see nursing notes for vital signs.

## 2012-01-14 NOTE — Anesthesia Preprocedure Evaluation (Signed)
Anesthesia Evaluation  Patient identified by MRN, date of birth, ID band Patient awake    Reviewed: Allergy & Precautions, H&P , Patient's Chart, lab work & pertinent test results  Airway Mallampati: III TM Distance: >3 FB Neck ROM: full    Dental No notable dental hx.    Pulmonary neg pulmonary ROS, asthma ,  breath sounds clear to auscultation  Pulmonary exam normal       Cardiovascular negative cardio ROS  Rhythm:regular Rate:Normal     Neuro/Psych negative neurological ROS  negative psych ROS   GI/Hepatic negative GI ROS, Neg liver ROS,   Endo/Other  negative endocrine ROSDiabetes mellitus-Morbid obesity  Renal/GU negative Renal ROS     Musculoskeletal   Abdominal   Peds  Hematology negative hematology ROS (+)   Anesthesia Other Findings Asthma     H/O chlamydia infection 2010      H/O varicella     Abnormal Pap smear 2007 colpo    Obese     Fibroid        Rubella non-immune status 01/11/2012    Reproductive/Obstetrics (+) Pregnancy                           Anesthesia Physical Anesthesia Plan  ASA: III  Anesthesia Plan: Epidural   Post-op Pain Management:    Induction:   Airway Management Planned:   Additional Equipment:   Intra-op Plan:   Post-operative Plan:   Informed Consent: I have reviewed the patients History and Physical, chart, labs and discussed the procedure including the risks, benefits and alternatives for the proposed anesthesia with the patient or authorized representative who has indicated his/her understanding and acceptance.     Plan Discussed with:   Anesthesia Plan Comments:         Anesthesia Quick Evaluation

## 2012-01-14 NOTE — Progress Notes (Signed)
  Subjective: Not uncomfortable--foley still in place despite significant tug.    Objective: BP 125/69  Pulse 107  Temp(Src) 97.8 F (36.6 C) (Oral)  Resp 20  Ht 5\' 3"  (1.6 m)  Wt 239 lb (108.41 kg)  BMI 42.34 kg/m2  LMP 04/14/2011      FHT:  Category 1 UC:   q 4-5 min, mild Pitocin on 12 mu/min througout night   Assessment / Plan: Will stop pitocin for rest period. May ambulate Will restart pitocin when foley bulb falls out, and anticipate AROM based on cervical status.  Nigel Bridgeman 01/14/2012, 11:51 AM

## 2012-01-15 ENCOUNTER — Encounter (HOSPITAL_COMMUNITY): Payer: Self-pay

## 2012-01-15 DIAGNOSIS — O99814 Abnormal glucose complicating childbirth: Secondary | ICD-10-CM

## 2012-01-15 DIAGNOSIS — O365990 Maternal care for other known or suspected poor fetal growth, unspecified trimester, not applicable or unspecified: Secondary | ICD-10-CM

## 2012-01-15 LAB — GLUCOSE, CAPILLARY
Glucose-Capillary: 80 mg/dL (ref 70–99)
Glucose-Capillary: 89 mg/dL (ref 70–99)

## 2012-01-15 MED ORDER — IBUPROFEN 600 MG PO TABS
600.0000 mg | ORAL_TABLET | Freq: Four times a day (QID) | ORAL | Status: DC
  Administered 2012-01-15 – 2012-01-17 (×7): 600 mg via ORAL
  Filled 2012-01-15 (×8): qty 1

## 2012-01-15 MED ORDER — BENZOCAINE-MENTHOL 20-0.5 % EX AERO
1.0000 "application " | INHALATION_SPRAY | CUTANEOUS | Status: DC | PRN
  Administered 2012-01-15: 1 via TOPICAL
  Filled 2012-01-15: qty 56

## 2012-01-15 MED ORDER — PRENATAL MULTIVITAMIN CH
1.0000 | ORAL_TABLET | Freq: Every day | ORAL | Status: DC
  Administered 2012-01-15 – 2012-01-17 (×3): 1 via ORAL
  Filled 2012-01-15 (×3): qty 1

## 2012-01-15 MED ORDER — SENNOSIDES-DOCUSATE SODIUM 8.6-50 MG PO TABS
2.0000 | ORAL_TABLET | Freq: Every day | ORAL | Status: DC
  Administered 2012-01-16: 2 via ORAL

## 2012-01-15 MED ORDER — LANOLIN HYDROUS EX OINT: TOPICAL_OINTMENT | CUTANEOUS | Status: DC | PRN

## 2012-01-15 MED ORDER — CALCIUM CARBONATE ANTACID 500 MG PO CHEW: 800.0000 mg | CHEWABLE_TABLET | Freq: Four times a day (QID) | ORAL | Status: DC | PRN

## 2012-01-15 MED ORDER — TETANUS-DIPHTH-ACELL PERTUSSIS 5-2.5-18.5 LF-MCG/0.5 IM SUSP
0.5000 mL | Freq: Once | INTRAMUSCULAR | Status: AC
  Administered 2012-01-16: 0.5 mL via INTRAMUSCULAR
  Filled 2012-01-15: qty 0.5

## 2012-01-15 MED ORDER — DIPHENHYDRAMINE HCL 25 MG PO CAPS: 25.0000 mg | ORAL_CAPSULE | Freq: Four times a day (QID) | ORAL | Status: DC | PRN

## 2012-01-15 MED ORDER — OXYCODONE-ACETAMINOPHEN 5-325 MG PO TABS: 1.0000 | ORAL_TABLET | ORAL | Status: DC | PRN

## 2012-01-15 MED ORDER — WITCH HAZEL-GLYCERIN EX PADS: 1.0000 "application " | MEDICATED_PAD | CUTANEOUS | Status: DC | PRN

## 2012-01-15 MED ORDER — SIMETHICONE 80 MG PO CHEW: 80.0000 mg | CHEWABLE_TABLET | ORAL | Status: DC | PRN

## 2012-01-15 MED ORDER — ONDANSETRON HCL 4 MG/2ML IJ SOLN: 4.0000 mg | INTRAMUSCULAR | Status: DC | PRN

## 2012-01-15 MED ORDER — ZOLPIDEM TARTRATE 5 MG PO TABS: 5.0000 mg | ORAL_TABLET | Freq: Every evening | ORAL | Status: DC | PRN

## 2012-01-15 MED ORDER — ONDANSETRON HCL 4 MG PO TABS: 4.0000 mg | ORAL_TABLET | ORAL | Status: DC | PRN

## 2012-01-15 MED ORDER — DIBUCAINE 1 % RE OINT: 1.0000 "application " | TOPICAL_OINTMENT | RECTAL | Status: DC | PRN

## 2012-01-15 NOTE — Progress Notes (Signed)
Patient ID: Brenda [name], female   DOB: July 30, 1986, 26 y.o.   MRN: [id number] .Subjective: Attempt at pushing, pt not really feeling strong urge pain is better   Objective: BP 133/71  Pulse 114  Temp(Src) 98.1 F (36.7 C) (Oral)  Resp 18  Ht 5\' 3"  (1.6 m)  Wt 239 lb (108.41 kg)  BMI 42.34 kg/m2  SpO2 99%  LMP 04/14/2011   FHT:  FHR: 140 bpm, variability: moderate,  accelerations:  Present,  decelerations:  Present early variables UC:   regular, every 4-5 minutes SVE:   Dilation: 10 Effacement (%): 100 Station: +1 Exam by:: s. Dorena Dorfman cnm  Pitocin at 17mu Ctx have spaced out some, MVU's about 200,   Assessment / Plan: 2nd stage GBS pos Cont to labor down, titrate pitocin Pt moved to HF to facilitate descent, will wait for urge to push    Fetal Wellbeing:  Category II Pain Control:  Epidural  Update physician PRN  [name] 01/15/2012, 6:21 AM

## 2012-01-15 NOTE — Anesthesia Postprocedure Evaluation (Signed)
  Anesthesia Post-op Note  Patient: Brenda [name]  Procedure(s) Performed: * No procedures listed *  Patient Location: Mother/Baby  Anesthesia Type: Epidural  Level of Consciousness: awake  Airway and Oxygen Therapy: Patient Spontanous Breathing  Post-op Pain: mild  Post-op Assessment: Patient's Cardiovascular Status Stable and Respiratory Function Stable  Post-op Vital Signs: stable  Complications: No apparent anesthesia complications

## 2012-01-15 NOTE — Progress Notes (Addendum)
Patient ID: Brenda [name], female   DOB: 1985-09-26, 26 y.o.   MRN: [id number] .Subjective: Feels more pressure now, c/o pain on L side w ctx, has been laying on r side   Objective: BP 117/60  Pulse 117  Temp(Src) 98.1 F (36.7 C) (Oral)  Resp 18  Ht 5\' 3"  (1.6 m)  Wt 239 lb (108.41 kg)  BMI 42.34 kg/m2  SpO2 99%  LMP 04/14/2011  CBG (last 3)   Basename 01/15/12 0411 01/15/12 0108 01/14/12 2257  GLUCAP 80 80 94      FHT:  FHR: 140 bpm, variability: moderate,  accelerations:  Present,  decelerations:  Present occ variables UC:   regular, every 4-5 minutes SVE:   Dilation: 9 Effacement (%): 80 Station: 0 Exam by:: s. [name] cnm  Cervix now complete +1 station  Pitocin at 17mu MVU's 300  Assessment / Plan: 2nd stage Cont labor down, turn to L side and push epidural PCA Will start pushing in 15-20 min after pt gets some relief  GBS pos, has rcvd PCN   Fetal Wellbeing:  Category I Pain Control:  Epidural  Update physician PRN  [name] 01/15/2012, 5:20 AM

## 2012-01-15 NOTE — Progress Notes (Signed)
UR chart review completed.  

## 2012-01-15 NOTE — Progress Notes (Signed)
Patient ID: Brenda [name], female   DOB: Jan 18, 1986, 26 y.o.   MRN: [id number] .Subjective:  Remains comfortable has been resting, feels some occ pressure  Objective: BP 102/60  Pulse 89  Temp(Src) 97.7 F (36.5 C) (Oral)  Resp 18  Ht 5\' 3"  (1.6 m)  Wt 239 lb (108.41 kg)  BMI 42.34 kg/m2  SpO2 99%  LMP 04/14/2011   FHT:  FHR: 130 bpm, variability: moderate,  accelerations:  Present,  decelerations:  Present occ variables 10x10 accels  UC:   regular, every 2-3 minutes SVE:   Dilation: 9 Effacement (%): 80 Station: 0 Exam by:: s. Hodan Wurtz cnm  Pitocin at 17mu  MVU's 180's   Assessment / Plan: IOL GBS pos on PCN Will cont pitocin, labor down await urge to push   Fetal Wellbeing:  Category I Pain Control:  Epidural  Update physician PRN  [name] 01/15/2012, 2:19 AM

## 2012-01-16 LAB — GLUCOSE, CAPILLARY: Glucose-Capillary: 96 mg/dL (ref 70–99)

## 2012-01-16 LAB — CBC
Hemoglobin: 11.7 g/dL — ABNORMAL LOW (ref 12.0–15.0)
MCH: 26.4 pg (ref 26.0–34.0)
MCHC: 32.1 g/dL (ref 30.0–36.0)
RDW: 16.4 % — ABNORMAL HIGH (ref 11.5–15.5)

## 2012-01-16 LAB — CCBB MATERNAL DONOR DRAW

## 2012-01-16 NOTE — Progress Notes (Signed)
Post Partum Day 1 Subjective: no complaints, up ad lib, voiding, tolerating PO, + flatus and pt and s.o. sleeping on my arrival; newborn getting hear screen in nursery.  Working on [employer].  VB stable.  Objective: Blood pressure 115/78, pulse 89, temperature 97.7 F (36.5 C), temperature source Oral, resp. rate 20, height 5\' 3"  (1.6 m), weight 239 lb (108.41 kg), last menstrual period 04/14/2011, SpO2 99.00%, unknown if currently breastfeeding. .. Results for orders placed during the hospital encounter of 01/11/12 (from the past 24 hour(s))  CBC     Status: Abnormal   Collection Time   01/16/12  5:35 AM      Component Value Range   WBC 7.6  4.0 - 10.5 (K/uL)   RBC 4.44  3.87 - 5.11 (MIL/uL)   Hemoglobin 11.7 (*) 12.0 - 15.0 (g/dL)   HCT 16.1  09.6 - 04.5 (%)   MCV 82.0  78.0 - 100.0 (fL)   MCH 26.4  26.0 - 34.0 (pg)   MCHC 32.1  30.0 - 36.0 (g/dL)   RDW 40.9 (*) 81.1 - 15.5 (%)   Platelets 165  150 - 400 (K/uL)  [facility] MATERNAL DONOR DRAW     Status: Normal   Collection Time   01/16/12  5:35 AM      Component Value Range   [facility] Maternal Donor Draw COLLECTED BY LABORATORY    GLUCOSE, CAPILLARY     Status: Normal   Collection Time   01/16/12  8:06 AM      Component Value Range   Glucose-Capillary 96  70 - 99 (mg/dL)   Physical Exam:  General: alert, cooperative, no distress and mildly obese Lochia: appropriate, rubra Uterine Fundus: firm, u/-1 Incision: n/a DVT Evaluation: No evidence of DVT seen on physical exam. Negative Homan's sign. No significant calf/ankle edema.   Basename 01/16/12 0535 01/13/12 1800  HGB 11.7* 13.0  HCT 36.4 39.2    Assessment/Plan: Plan for discharge tomorrow, Breastfeeding and Lactation consult Fasting cbg's for GDM (diet controlled); s/p serial IOL for SGA & GDM.  LOS: 5 days   Xiong Haidar H 01/16/2012, 12:25 PM

## 2012-01-17 MED ORDER — IBUPROFEN 600 MG PO TABS: 600.0000 mg | ORAL_TABLET | Freq: Four times a day (QID) | ORAL | Status: AC

## 2012-01-17 MED ORDER — MEASLES, MUMPS & RUBELLA VAC ~~LOC~~ INJ
0.5000 mL | INJECTION | Freq: Once | SUBCUTANEOUS | Status: AC
  Administered 2012-01-17: 0.5 mL via SUBCUTANEOUS
  Filled 2012-01-17: qty 0.5

## 2012-01-17 MED ORDER — NORETHINDRONE 0.35 MG PO TABS: 1.0000 | ORAL_TABLET | Freq: Every day | ORAL | Status: DC

## 2012-01-17 NOTE — Discharge Summary (Signed)
Obstetric Discharge Summary Reason for Admission: induction of labor and for GDM and SGA Prenatal Procedures: NST and ultrasound Intrapartum Procedures: spontaneous vaginal delivery, GBS prophylaxis and epidural, serial IOL, BSUS, cytotec, pitocin, foley bulb. PIH labs.  Postpartum Procedures: Rubella Ig Complications-Operative and Postpartum: none, bilateral periurethral lacerations  Temp:  [97.6 F (36.4 C)-98.2 F (36.8 C)] 97.6 F (36.4 C) (06/09 0540) Pulse Rate:  [80-84] 81  (06/09 0540) Resp:  [16-18] 18  (06/09 0540) BP: (113-121)/(60-85) 113/60 mmHg (06/09 0540) Hemoglobin  Date Value Range Status  01/16/2012 11.7* 12.0-15.0 (g/dL) Final     HCT  Date Value Range Status  01/16/2012 36.4  36.0-46.0 (%) Final    Hospital Course:  Hospital Course: Admitted for IOL secondary to GDM - diet controlled and SGA on 6-3, pt initially received 3 doses of cytotec overnight and pitocin was started on 6-4. SHe initially had some elevated BP"s and PIH labs were done and were normal and pt was without symptoms.  That evening cytotec x3 doses was repeated again. And pitocin started on 6-5. A foley bulb was then placed and pitocin continued, she also rcv'd PCN prophylaxis for  pos GBS. Pitocin was stopped for a short while and the foley bulb stayed in place and fell out on 6-6 and AROM was then performed. She then received and epidural that evening on 6-7, and and IUPC was placed and pitocin continued, she then progressed to complete and labored down nicely and had SVD  performed by V.Latham  without difficulty. During induction her CBG's were stable.  With bilateral periurethral lacerations. Patient and baby tolerated the procedure without difficulty. nfant to FTN. Mother and infant then had an uncomplicated postpartum course, with breast feeding going well. Mom's physical exam was WNL, and she was discharged home in stable condition. Contraception plan was micorno, tho she was also considering mirena  or nexplanon.  She received adequate benefit from po pain medications.  Discharge Diagnoses: Term Pregnancy-delivered and GDM - diet controlled and SGA infant  Discharge Information: Date: 01/17/2012 Activity: pelvic rest, nothing in the vagina for 6 weeks Diet: routine, continue carb modified diet and healthy eating habits, follow up with primary care physician for evaluation of diabetes in 6 months to 1 year.  Medications:  Medication List  As of 01/17/2012  8:52 AM   START taking these medications         ibuprofen 600 MG tablet   Commonly known as: ADVIL,MOTRIN   Take 1 tablet (600 mg total) by mouth every 6 (six) hours.      norethindrone 0.35 MG tablet   Commonly known as: MICRONOR,CAMILA,ERRIN   Take 1 tablet (0.35 mg total) by mouth daily.   Start taking on: 02/07/2012         CONTINUE taking these medications         calcium carbonate 500 MG chewable tablet   Commonly known as: TUMS - dosed in mg elemental calcium      CONCEPT DHA 53.5-38-1 MG Caps   1 tablet daily         STOP taking these medications         BLOOD GLUCOSE METER DISPOSABLE Devi      VAGISIL ANTI-ITCH MEDICATED EX          Where to get your medications    These are the prescriptions that you need to pick up. We sent them to a specific pharmacy, so you will need to go there to get them.  [id number] - [place], [place] - [address]    [address]  [place] [address]    Phone: [phone]        ibuprofen 600 MG tablet   norethindrone 0.35 MG tablet           Condition: stable Instructions: refer to practice specific booklet Discharge to: home Follow-up Information    Follow up with [facility] in 5 weeks. (call to make appointment)          Newborn Data: Live born  Information for the patient's newborn:  Shekera, Beavers [[id number]]  female ; APGAR ,9, 9  ; weight ; 5#9pz  Home with mother.  Maurie Musco  M 01/17/2012, 8:52 AM

## 2012-01-17 NOTE — Discharge Instructions (Signed)
Vaginal Delivery Care After  Change your pad on each trip to the bathroom.   Wipe gently with toilet paper during your hospital stay. Always wipe from front to back. A spray bottle with warm tap water could also be used or a towelette if available.   Place your soiled pad and toilet paper in a bathroom wastebasket with a plastic bag liner.   During your hospital stay, save any clots. If you pass a clot while on the toilet, do not flush it. Also, if your vaginal flow seems excessive to you, notify nursing personnel.   The first time you get out of bed after delivery, wait for assistance from a nurse. Do not get up alone at any time if you feel weak or dizzy.   Bend and extend your ankles forcefully so that you feel the calves of your legs get hard. Do this 6 times every hour when you are in bed and awake.   Do not sit with one foot under you, dangle your legs over the edge of the bed, or maintain a position that hinders the circulation in your legs.   Many women experience after pains for 2 to 3 days after delivery. These after pains are mild uterine contractions. Ask the nurse for a pain medication if you need something for this. Sometimes breastfeeding stimulates after pains; if you find this to be true, ask for the medication  -  hour before the next feeding.   For you and your infant's protection, do not go beyond the door(s) of the obstetric unit. Do not carry your baby in your arms in the hallway. When taking your baby to and from your room, put your baby in the bassinet and push the bassinet.   Mothers may have their babies in their room as much as they desire.  Document Released: 07/24/2000 Document Revised: 07/16/2011 Document Reviewed: 06/24/2007 [employer] Patient Information 2012 [employer]. Breastfeeding BENEFITS OF BREASTFEEDING For the baby  The first milk (colostrum) helps the baby's digestive system function better.   There are antibodies from the mother in the milk  that help the baby fight off infections.   The baby has a lower incidence of asthma, allergies, and SIDS (sudden infant death syndrome).   The nutrients in breast milk are better than formulas for the baby and helps the baby's brain grow better.   Babies who breastfeed have less gas, colic, and constipation.  For the mother  Breastfeeding helps develop a very special bond between mother and baby.   It is more convenient, always available at the correct temperature and cheaper than formula feeding.   It burns calories in the mother and helps with losing weight that was gained during pregnancy.   It makes the uterus contract back down to normal size faster and slows bleeding following delivery.   Breastfeeding mothers have a lower risk of developing breast cancer.  NURSE FREQUENTLY  A healthy, full-term baby may breastfeed as often as every hour or space his or her feedings to every 3 hours.   How often to nurse will vary from baby to baby. Watch your baby for signs of hunger, not the clock.   Nurse as often as the baby requests, or when you feel the need to reduce the fullness of your breasts.   Awaken the baby if it has been 3 to 4 hours since the last feeding.   Frequent feeding will help the mother make more milk and will prevent problems like   sore nipples and engorgement of the breasts.  BABY'S POSITION AT THE BREAST  Whether lying down or sitting, be sure that the baby's tummy is facing your tummy.   Support the breast with 4 fingers underneath the breast and the thumb above. Make sure your fingers are well away from the nipple and baby's mouth.   Stroke the baby's lips and cheek closest to the breast gently with your finger or nipple.   When the baby's mouth is open wide enough, place all of your nipple and as much of the dark area around the nipple as possible into your baby's mouth.   Pull the baby in close so the tip of the nose and the baby's cheeks touch the breast  during the feeding.  FEEDINGS  The length of each feeding varies from baby to baby and from feeding to feeding.   The baby must suck about 2 to 3 minutes for your milk to get to him or her. This is called a "let down." For this reason, allow the baby to feed on each breast as long as he or she wants. Your baby will end the feeding when he or she has received the right balance of nutrients.   To break the suction, put your finger into the corner of the baby's mouth and slide it between his or her gums before removing your breast from his or her mouth. This will help prevent sore nipples.  REDUCING BREAST ENGORGEMENT  In the first week after your baby is born, you may experience signs of breast engorgement. When breasts are engorged, they feel heavy, warm, full, and may be tender to the touch. You can reduce engorgement if you:   Nurse frequently, every 2 to 3 hours. Mothers who breastfeed early and often have fewer problems with engorgement.   Place light ice packs on your breasts between feedings. This reduces swelling. Wrap the ice packs in a lightweight towel to protect your skin.   Apply moist hot packs to your breast for 5 to 10 minutes before each feeding. This increases circulation and helps the milk flow.   Gently massage your breast before and during the feeding.   Make sure that the baby empties at least one breast at every feeding before switching sides.   Use a breast pump to empty the breasts if your baby is sleepy or not nursing well. You may also want to pump if you are returning to work or or you feel you are getting engorged.   Avoid bottle feeds, pacifiers or supplemental feedings of water or juice in place of breastfeeding.   Be sure the baby is latched on and positioned properly while breastfeeding.   Prevent fatigue, stress, and anemia.   Wear a supportive bra, avoiding underwire styles.   Eat a balanced diet with enough fluids.  If you follow these suggestions,  your engorgement should improve in 24 to 48 hours. If you are still experiencing difficulty, call your lactation consultant or caregiver. IS MY BABY GETTING ENOUGH MILK? Sometimes, mothers worry about whether their babies are getting enough milk. You can be assured that your baby is getting enough milk if:  The baby is actively sucking and you hear swallowing.   The baby nurses at least 8 to 12 times in a 24 hour time period. Nurse your baby until he or she unlatches or falls asleep at the first breast (at least 10 to 20 minutes), then offer the second side.   The baby   wetting 5 to 6 disposable diapers (6 to 8 cloth diapers) in a 24 hour period by 102 to 11 days of age.   The baby is having at least 2 to 3 stools every 24 hours for the first few months. Breast milk is all the food your baby needs. It is not necessary for your baby to have water or formula. In fact, to help your breasts make more milk, it is best not to give your baby supplemental feedings during the early weeks.   The stool should be soft and yellow.   The baby should gain 4 to 7 ounces per week after he is 74 days old.  TAKE CARE OF YOURSELF Take care of your breasts by:  Bathing or showering daily.   Avoiding the use of soaps on your nipples.   Start feedings on your left breast at one feeding and on your right breast at the next feeding.   You will notice an increase in your milk supply 2 to 5 days after delivery. You may feel some discomfort from engorgement, which makes your breasts very firm and often tender. Engorgement "peaks" out within 24 to 48 hours. In the meantime, apply warm moist towels to your breasts for 5 to 10 minutes before feeding. Gentle massage and expression of some milk before feeding will soften your breasts, making it easier for your baby to latch on. Wear a well fitting nursing bra and air dry your nipples for 10 to 15 minutes after each feeding.   Only use cotton bra pads.   Only use pure  lanolin on your nipples after nursing. You do not need to wash it off before nursing.  Take care of yourself by:   Eating well-balanced meals and nutritious snacks.   Drinking milk, fruit juice, and water to satisfy your thirst (about 8 glasses a day).   Getting plenty of rest.   Increasing calcium in your diet (1200 mg a day).   Avoiding foods that you notice affect the baby in a bad way.  SEEK MEDICAL CARE IF:   You have any questions or difficulty with breastfeeding.   You need help.   You have a hard, red, sore area on your breast, accompanied by a fever of 100.5 F (38.1 C) or more.   Your baby is too sleepy to eat well or is having trouble sleeping.   Your baby is wetting less than 6 diapers per day, by 98 days of age.   Your baby's skin or white part of his or her eyes is more yellow than it was in the hospital.   You feel depressed.  Document Released: 07/27/2005 Document Revised: 07/16/2011 Document Reviewed: 03/11/2009 [facility] Patient Information 2012 [place], [place].Oral Contraception Information Oral contraceptives (OCs) are medicines taken to prevent pregnancy. OCs work by preventing the ovaries from releasing eggs. The hormones in OCs also cause the cervical mucus to thicken, preventing the sperm from entering the uterus. The hormones also cause the uterine lining to become thin, not allowing a fertilized egg to attach to the inside of the uterus. OCs are highly effective when taken exactly as prescribed. However, OCs do not prevent sexually transmitted diseases (STDs). Safe sex practices, such as using condoms along with the pill, can help prevent STDs.  Before taking the pill, you may have a physical exam and Pap test. Your caregiver may order blood tests that may be necessary. Your caregiver will make sure you are a good candidate for oral contraception. Discuss  with your caregiver the possible side effects of the OC you may be prescribed. When starting an OC, it can  take 2 to 3 months for the body to adjust to the changes in hormone levels in your body.  TYPES OF ORAL CONTRACEPTION  The combination pill. This pill contains estrogen and progestin (synthetic progesterone) hormones. The combination pill comes in either 21-day or 28-day packs. With 21-day packs, you do not take pills for 7 days after the last pill. With 28-day packs, the pill is taken every day. The last 7 pills are without hormones. Certain types of pills have more than 21 hormone-containing pills.   The minipill. This pill contains the progesterone hormone only. It is taken every day continuously. The minipill comes in packs of 91 pills. The first 84 pills contain the hormones, and the last 7 pills do not. The last 7 days are when you will have your menstrual period. You may experience irregular spotting.  ADVANTAGES  Decreases premenstrual symptoms.   Treats menstrual period cramps.   Regulates the menstrual cycle.   Decreases a heavy menstrual flow.   Treats acne.   Treats abnormal uterine bleeding.   Treats chronic pelvic pain.   Treats polycystic ovarian syndrome.   Treats endometriosis.   Can be used as emergency contraception.  DISADVANTAGES OCs can be less effective if:  You forget to take the pill at the same time every day.   You have a stomach or intestinal disease that lessens the absorption of the pill.   You take OCs with other medicines that make OCs less effective.   You take expired OCs.   You forget to restart the pill on day 7, when using the packs of 21 pills.  Document Released: 10/17/2002 Document Revised: 07/16/2011 Document Reviewed: 12/03/2010 [facility] Patient Information 2012 [place], [place].Contraceptive Implant Information A contraceptive implant is a plastic rod that is inserted under the skin. It is usually inserted under the skin of your upper arm. It continually releases small amounts of progestin (synthetic progesterone) into the  bloodstream. This prevents an egg from being released from the ovary. It also thickens the cervical mucus to prevent sperm from entering the cervix, and it thins the uterine lining to prevent a fertilized egg from attaching to the uterus. They can be effective for up to 3 years. Implants do not provide protection against sexually transmitted diseases (STDs).  The procedure to insert an implant usually takes about 10 minutes. There may be minor bruising, swelling, and discomfort at the insertion site for a couple days. The implant begins to work within the first day. Other contraceptive protection should be used for 2 weeks. Follow up with your caregiver to get rechecked as directed. Your caregiver will make sure you are a good candidate for the contraceptive implant. Discuss with your caregiver the possible side effects of the implant ADVANTAGES  It prevents pregnancy for up to 3 years.   It is easily reversible.   It is convenient.   The progestins may protect against uterine and ovarian cancer.   It can be used when breastfeeding.   It can be used by women who cannot take estrogen.  DISADVANTAGES  You may have irregular or unplanned vaginal bleeding.   You may develop side effects, including headache, weight gain, acne, breast tenderness, or mood changes.   You may have tissue or nerve damage after insertion (rare).   It may be difficult and uncomfortable to remove.   Certain medications may interfere  with the effectiveness of the implants.  REMOVAL OF IMPLANT The implant should be removed in 3 years or as directed by your caregiver. The implants effect wears off in a few hours after removal. Your ability to get pregnant (fertility) is restored within a couple of weeks. New implants can be inserted as soon as the old ones are removed if desired. DO NOT GET THE IMPLANT IF:   You are pregnant.   You have a history of breast cancer, osteoporosis, blood clots, heart disease, diabetes,  high blood pressure, liver disease, tumors, or stroke.    You have undiagnosed vaginal bleeding.   You have overly sensitive to certain parts of the implant.  Document Released: 07/16/2011 Document Reviewed: 07/14/2011 [facility] Patient Information 2012 [place], [place].Intrauterine Device Information An intrauterine device (IUD) is inserted into your uterus and prevents pregnancy. There are 2 types of IUDs available:  Copper IUD. This type of IUD is wrapped in copper wire and is placed inside the uterus. Copper makes the uterus and fallopian tubes produce a fluid that kills sperm. The copper IUD can stay in place for 10 years.   Hormone IUD. This type of IUD contains the hormone progestin (synthetic progesterone). The hormone thickens the cervical mucus and prevents sperm from entering the uterus, and it also thins the uterine lining to prevent implantation of a fertilized egg. The hormone can weaken or kill the sperm that get into the uterus. The hormone IUD can stay in place for 5 years.  Your caregiver will make sure you are a good candidate for a contraceptive IUD. Discuss with your caregiver the possible side effects. ADVANTAGES  It is highly effective, reversible, long-acting, and low maintenance.   There are no estrogen-related side effects.   An IUD can be used when breastfeeding.   It is not associated with weight gain.   It works immediately after insertion.   The copper IUD does not interfere with your female hormones.   The progesterone IUD can make heavy menstrual periods lighter.   The progesterone IUD can be used for 5 years.   The copper IUD can be used for 10 years.  DISADVANTAGES  The progesterone IUD can be associated with irregular bleeding patterns.   The copper IUD can make your menstrual flow heavier and more painful.   You may experience cramping and vaginal bleeding after insertion.  Document Released: 06/30/2004 Document Revised: 07/16/2011 Document  Reviewed: 11/29/2010 [facility] Patient Information 2012 [place], [place].

## 2012-01-18 ENCOUNTER — Encounter: Payer: Medicaid Other | Admitting: Obstetrics and Gynecology

## 2012-02-08 ENCOUNTER — Other Ambulatory Visit: Payer: Self-pay

## 2012-02-08 ENCOUNTER — Telehealth: Payer: Self-pay | Admitting: Obstetrics and Gynecology

## 2012-02-08 NOTE — Telephone Encounter (Signed)
Spoke with pt rgd msg informed rx for prenatal vits had 1 yr refill call pharmacy for refills pt voice understanding

## 2012-02-08 NOTE — Telephone Encounter (Signed)
Triage/general quest. 

## 2012-02-16 ENCOUNTER — Encounter: Payer: Self-pay | Admitting: Obstetrics and Gynecology

## 2012-02-16 ENCOUNTER — Ambulatory Visit (INDEPENDENT_AMBULATORY_CARE_PROVIDER_SITE_OTHER): Payer: Medicaid Other | Admitting: Obstetrics and Gynecology

## 2012-02-16 MED ORDER — NORETHINDRONE-ETH ESTRADIOL 1-35 MG-MCG PO TABS: 1.0000 | ORAL_TABLET | Freq: Every day | ORAL | Status: DC

## 2012-02-16 NOTE — Progress Notes (Signed)
Brenda [name]  is 4 week postpartum following a spontaneous vaginal delivery at 44 gestational weeks Date: 01/15/2012 female baby named Brenda [name] delivered by Nigel Bridgeman.  Breastfeeding: no Bottlefeeding:  yes  Post-partum blues / depression:  no  EPDS score: 6 History of abnormal Pap:  yes  Last Pap: Date:  07/2011 Health Dept: normal Gestational diabetes:  yes  Contraception:  Desires oral contraceptives (estrogen/progesterone)  Normal urinary function:  yes Normal GI function:  yes Returning to work:  Yes  Pt c/o feeling "Bubbling" in her stomach. No loose stools.   Subjective:     Brenda [name] is a 26 y.o. female who presents for a postpartum visit.  I have fully reviewed the prenatal and intrapartum course.     The following portions of the patient's history were reviewed and updated as appropriate: allergies, current medications, past family history, past medical history, past social history, past surgical history and problem list.  Review of Systems Pertinent items are noted in HPI.   Objective:    BP 112/62  Ht 5\' 3"  (1.6 m)  Wt 216 lb (97.977 kg)  BMI 38.26 kg/m2  Breastfeeding? No  General:  alert, cooperative and no distress     Lungs: clear to auscultation bilaterally  Heart:  regular rate and rhythm, S1, S2 normal, no murmur  Abdomen: soft, non-tender; bowel sounds normal; no masses,  no organomegaly   Vulva:  normal  Vagina: normal vagina  Cervix:  normal  Corpus: normal size, contour, position, consistency, mobility, non-tender  Adnexa:  normal adnexa             Assessment:     Normal postpartum exam.  GDM Pap smear not done at today's visit.   Plan:   Schedule FBS Norinyl 1/35 to start 02/21/12 with BUM for 1 month  Thalia Turkington A MD 02/16/2012 11:51 AM

## 2012-02-18 ENCOUNTER — Telehealth: Payer: Self-pay | Admitting: Obstetrics and Gynecology

## 2012-02-18 NOTE — Telephone Encounter (Signed)
Triage/epic 

## 2012-02-19 NOTE — Telephone Encounter (Signed)
Pt requesting letter to [facility].   Had normal PPV . Will consult SR for verification.  ld

## 2012-02-20 NOTE — Telephone Encounter (Signed)
OK to return to work 

## 2012-02-22 ENCOUNTER — Encounter: Payer: Self-pay | Admitting: Obstetrics and Gynecology

## 2012-02-22 NOTE — Telephone Encounter (Signed)
LM for pt that letter to [facility] will be left up front for pick up.  Ok per SR.  ld

## 2012-02-24 ENCOUNTER — Other Ambulatory Visit: Payer: Medicaid Other

## 2012-02-29 ENCOUNTER — Other Ambulatory Visit: Payer: Medicaid Other

## 2012-03-11 ENCOUNTER — Encounter: Payer: Self-pay | Admitting: Obstetrics and Gynecology

## 2012-03-11 ENCOUNTER — Other Ambulatory Visit: Payer: Medicaid Other

## 2012-03-11 ENCOUNTER — Ambulatory Visit (INDEPENDENT_AMBULATORY_CARE_PROVIDER_SITE_OTHER): Payer: Medicaid Other | Admitting: Obstetrics and Gynecology

## 2012-03-11 VITALS — BP 110/70 | Temp 98.8°F | Resp 16 | Ht 63.5 in | Wt 215.0 lb

## 2012-03-11 DIAGNOSIS — Z8632 Personal history of gestational diabetes: Secondary | ICD-10-CM

## 2012-03-11 DIAGNOSIS — N63 Unspecified lump in unspecified breast: Secondary | ICD-10-CM

## 2012-03-11 NOTE — Progress Notes (Signed)
26 YO with a history of a painful, marble-like right breast nodule earlier this week that has since resolved. Patient delivered June 7th SVB.   Only breastfed x 3 weeks.  Patient has not had a period since delivery and no family history of breast disease.  Admits to large caffeine consumption. Patient also states that she is to have a fasting blood sugar today due to her history of gestational diabetes.  O: Breasts: (pendulous) no masses, retractions, skin changes, nipple discharge or adenopathy  A: H/O Right Breast Nodule-resolved     H/O Gestational Diabetes  P:  Fasting glucose today per plan at  [facility] visit       Decrease caffeine intake      Monitor breast for lumps,  if symptoms reappear and don't resolve in    2 weeks, notify the office     [facility]-as scheduled  Dalynn Jhaveri, PA-C

## 2012-03-23 ENCOUNTER — Telehealth: Payer: Self-pay | Admitting: Obstetrics and Gynecology

## 2012-03-23 NOTE — Telephone Encounter (Signed)
Spoke with pt rgd msg pt wants appt for eval of vag discharge and odor pt has appt 04/04/12 at 2:45 with ep pt voice understanding

## 2012-03-23 NOTE — Telephone Encounter (Signed)
Triage/appt

## 2012-03-29 ENCOUNTER — Telehealth: Payer: Self-pay | Admitting: Obstetrics and Gynecology

## 2012-03-29 NOTE — Telephone Encounter (Signed)
Pt call complaining of PPD. Delivered 01/15/2012, now having crying spells and feeling over-whelmed. Brenda [name]

## 2012-03-30 ENCOUNTER — Ambulatory Visit (INDEPENDENT_AMBULATORY_CARE_PROVIDER_SITE_OTHER): Payer: Medicaid Other | Admitting: Obstetrics and Gynecology

## 2012-03-30 ENCOUNTER — Encounter: Payer: Self-pay | Admitting: Obstetrics and Gynecology

## 2012-03-30 VITALS — BP 110/68 | Wt 213.0 lb

## 2012-03-30 DIAGNOSIS — F4321 Adjustment disorder with depressed mood: Secondary | ICD-10-CM

## 2012-03-30 NOTE — Progress Notes (Signed)
Pt delivered 01/15/12 and states she has been feeling overwhelmed, upset, and frustrated for the past week. Pt recently moved and states that her boyfriend got a new job working late. No HIS or SIS.  No history of depression.  Pt having more crying spells.  EPDS 11 BP 110/68  Wt 213 lb (96.616 kg)  Breastfeeding? No Physical Examination: General appearance - alert, well appearing, and in no distress Heart - normal rate and regular rhythm Abdomen - soft, nontender, nondistended, no masses or organomegaly Extremities - Homan's sign negative bilaterally Depression Pt declined medicine She will do psychotherapy.  Will refer to the ringer center Call with any HI or SI Check TSH

## 2012-03-30 NOTE — Patient Instructions (Signed)
Depression  Depression is a strong emotion of feeling unhappy that can last for weeks, months, or even longer. Depression causes problems with the ability to function in life. It upsets your:   Relationships.   Sleep.   Eating habits.   Work habits.  HOME CARE  Take all medicine as told by your doctor.   Talk with a therapist, counselor, or friend.   Eat a healthy diet.   Exercise regularly.   Do not drink alcohol or use drugs.  GET HELP RIGHT AWAY IF: You start to have thoughts about hurting yourself or others. MAKE SURE YOU:  Understand these instructions.   Will watch your condition.   Will get help right away if you are not doing well or get worse.  Document Released: 08/29/2010 Document Revised: 07/16/2011 Document Reviewed: 08/29/2010 [employer] Patient Information 2012 [employer]. 

## 2012-04-04 ENCOUNTER — Encounter: Payer: Medicaid Other | Admitting: Obstetrics and Gynecology

## 2012-07-01 ENCOUNTER — Encounter (HOSPITAL_COMMUNITY): Payer: Self-pay | Admitting: Emergency Medicine

## 2012-07-01 ENCOUNTER — Emergency Department (HOSPITAL_COMMUNITY)
Admission: EM | Admit: 2012-07-01 | Discharge: 2012-07-01 | Disposition: A | Discharge status: Home / Self Care | Payer: Self-pay | Attending: Emergency Medicine | Admitting: Emergency Medicine

## 2012-07-01 DIAGNOSIS — Z79899 Other long term (current) drug therapy: Secondary | ICD-10-CM | POA: Insufficient documentation

## 2012-07-01 DIAGNOSIS — J45909 Unspecified asthma, uncomplicated: Secondary | ICD-10-CM | POA: Insufficient documentation

## 2012-07-01 DIAGNOSIS — Z8619 Personal history of other infectious and parasitic diseases: Secondary | ICD-10-CM | POA: Insufficient documentation

## 2012-07-01 DIAGNOSIS — Z8742 Personal history of other diseases of the female genital tract: Secondary | ICD-10-CM | POA: Insufficient documentation

## 2012-07-01 DIAGNOSIS — K0889 Other specified disorders of teeth and supporting structures: Secondary | ICD-10-CM

## 2012-07-01 DIAGNOSIS — K089 Disorder of teeth and supporting structures, unspecified: Secondary | ICD-10-CM | POA: Insufficient documentation

## 2012-07-01 MED ORDER — NAPROXEN 500 MG PO TABS: 500.0000 mg | ORAL_TABLET | Freq: Two times a day (BID) | ORAL | Status: DC

## 2012-07-01 NOTE — ED Provider Notes (Signed)
History   This chart was scribed for Celene Kras, MD by Charolett Bumpers, ER Scribe. The patient was seen in room TR08C/TR08C. Patient's care was started at 1408.   CSN: [id number] Arrival date & time 07/01/12  1353  First MD Initiated Contact with Patient 07/01/12 1408      The history is provided by the patient. No language interpreter was used.   Brenda [name] is a 26 y.o. female who presents to the Emergency Department complaining of intermittent, moderate left ear pain for the past month. She reports associated upper dental pain and some pain with chewing. She denies any ear drainage. She denies any congestion, fever, cough. She states that she has not been seen for her symptoms.   Past Medical History  Diagnosis Date  . Asthma   . H/O chlamydia infection 2010  . H/O varicella   . Abnormal Pap smear 2007    colpo  . Obese   . Fibroid   . Rubella non-immune status 01/11/2012    Past Surgical History  Procedure Date  . No past surgeries     Family History  Problem Relation Age of Onset  . Anesthesia problems Neg Hx   . Hypertension Mother   . Hypertension Father   . Alcohol abuse Father   . Heart disease Maternal Aunt   . Mental illness Maternal Aunt     bipolar  . Heart disease Maternal Grandmother   . Hypertension Maternal Grandmother   . Thrombophlebitis Maternal Grandmother   . Hypertension Paternal Grandmother   . Asthma Cousin   . Cancer Cousin   . Heart disease Cousin     congenital heart disease  . Learning disabilities Cousin     History  Substance Use Topics  . Smoking status: Never Smoker   . Smokeless tobacco: Never Used  . Alcohol Use: No    OB History    Grav Para Term Preterm Abortions TAB SAB Ect Mult Living   1 1 1  0 0 0 0 0 0 1      Review of Systems  Constitutional: Negative for fever and chills.  HENT: Positive for ear pain and dental problem. Negative for congestion.   Respiratory: Negative for shortness of breath.     Gastrointestinal: Negative for nausea and vomiting.  Neurological: Negative for weakness.  All other systems reviewed and are negative.    Allergies  Review of patient's allergies indicates no known allergies.  Home Medications   Current Outpatient Rx  Name  Route  Sig  Dispense  Refill  . CALCIUM CARBONATE ANTACID 500 MG PO CHEW   Oral   Chew 1 tablet by mouth daily as needed. For heartburn         . NORETHINDRONE 0.35 MG PO TABS   Oral   Take 1 tablet (0.35 mg total) by mouth daily.   1 Package   11     You may start in 3 weeks, take at the same time ev ...   . NORETHINDRONE-ETH ESTRADIOL 1-35 MG-MCG PO TABS   Oral   Take 1 tablet by mouth daily.   1 Package   11   . CONCEPT DHA 53.5-38-1 MG PO CAPS      1 tablet daily   90 capsule   11     BP 136/83  Pulse 100  Temp 98.4 F (36.9 C) (Oral)  Resp 18  SpO2 97%  Breastfeeding? No  Physical Exam  Nursing note and vitals  reviewed. Constitutional: She appears well-developed and well-nourished. No distress.  HENT:  Head: Normocephalic and atraumatic.  Right Ear: Tympanic membrane, external ear and ear canal normal. No drainage, swelling or tenderness. Tympanic membrane is not injected, not erythematous and not bulging.  Left Ear: Tympanic membrane, external ear and ear canal normal. No drainage, swelling or tenderness. Tympanic membrane is not injected, not erythematous and not bulging.  Mouth/Throat: No posterior oropharyngeal erythema.       Questionable dental caries in left upper posterior area.   Eyes: Conjunctivae normal are normal. Right eye exhibits no discharge. Left eye exhibits no discharge. No scleral icterus.  Neck: Neck supple. No tracheal deviation present.  Cardiovascular: Normal rate.   Pulmonary/Chest: Effort normal. No stridor. No respiratory distress.  Musculoskeletal: She exhibits no edema.  Lymphadenopathy:    She has no cervical adenopathy.  Neurological: She is alert. Cranial  nerve deficit: no gross deficits.  Skin: Skin is warm and dry. No rash noted.  Psychiatric: She has a normal mood and affect.    ED Course  Procedures (including critical care time)  DIAGNOSTIC STUDIES: Oxygen Saturation is 97% on room air, adequate by my interpretation.    COORDINATION OF CARE:  14:17-Discussed planned course of treatment with the patient including f/u with a dentist, who is agreeable at this time.     Labs Reviewed - No data to display No results found.   1. Toothache       MDM  Suspect her symptoms are related to a dental issue.  Her ear exam is normal.  Will rx nsaids.  Refer to dentist.  I personally performed the services described in this documentation, which was scribed in my presence. The recorded information has been reviewed and is accurate.      Celene Kras, MD 07/01/12 [phone]

## 2012-07-01 NOTE — ED Notes (Signed)
Left ear pain x1 month.

## 2012-08-05 ENCOUNTER — Other Ambulatory Visit: Payer: Self-pay

## 2012-09-29 ENCOUNTER — Emergency Department (INDEPENDENT_AMBULATORY_CARE_PROVIDER_SITE_OTHER)
Admission: EM | Admit: 2012-09-29 | Discharge: 2012-09-29 | Disposition: A | Discharge status: Home / Self Care | Payer: Self-pay | Source: Home / Self Care | Attending: Emergency Medicine | Admitting: Emergency Medicine

## 2012-09-29 ENCOUNTER — Encounter (HOSPITAL_COMMUNITY): Payer: Self-pay | Admitting: *Deleted

## 2012-09-29 DIAGNOSIS — J020 Streptococcal pharyngitis: Secondary | ICD-10-CM

## 2012-09-29 LAB — POCT RAPID STREP A: Streptococcus, Group A Screen (Direct): POSITIVE — AB

## 2012-09-29 MED ORDER — AMOXICILLIN 500 MG PO CAPS: 500.0000 mg | ORAL_CAPSULE | Freq: Three times a day (TID) | ORAL | Status: AC

## 2012-09-29 NOTE — ED Notes (Signed)
C/o sore throat onset 2-3 days ago.  No chills or fever.  Nose just started running on arrival.  No cough or ear ache.

## 2012-09-29 NOTE — ED Provider Notes (Signed)
History     CSN: [id number]  Arrival date & time 09/29/12  6213   First MD Initiated Contact with Patient 09/29/12 1904      Chief Complaint  Patient presents with  . Sore Throat    (Consider location/radiation/quality/duration/timing/severity/associated sxs/prior treatment) HPI Comments: Patient presents urgent care this evening complaining of sore throat for 2 days. No fevers or chills no cough or respiratory difficulty or earache. Patient denies any abdominal pain, sinus pressure congestion patient has not treated herself with any medicine so far. It does hurt to swallow.  Patient is a 27 y.o. female presenting with pharyngitis. The history is provided by the patient.  Sore Throat This is a new problem. The current episode started 2 days ago. The problem occurs constantly. The problem has been gradually worsening. Pertinent negatives include no headaches and no shortness of breath. The symptoms are aggravated by swallowing. She has tried nothing for the symptoms. The treatment provided no relief.    Past Medical History  Diagnosis Date  . Asthma   . H/O chlamydia infection 2010  . H/O varicella   . Abnormal Pap smear 2007    colpo  . Obese   . Fibroid   . Rubella non-immune status 01/11/2012    Past Surgical History  Procedure Laterality Date  . No past surgeries      Family History  Problem Relation Age of Onset  . Anesthesia problems Neg Hx   . Hypertension Mother   . Hypertension Father   . Alcohol abuse Father   . Heart disease Maternal Aunt   . Mental illness Maternal Aunt     bipolar  . Heart disease Maternal Grandmother   . Hypertension Maternal Grandmother   . Thrombophlebitis Maternal Grandmother   . Hypertension Paternal Grandmother   . Asthma Cousin   . Cancer Cousin   . Heart disease Cousin     congenital heart disease  . Learning disabilities Cousin     History  Substance Use Topics  . Smoking status: Never Smoker   . Smokeless tobacco:  Never Used  . Alcohol Use: Yes     Comment: occasional    OB History   Grav Para Term Preterm Abortions TAB SAB Ect Mult Living   1 1 1  0 0 0 0 0 0 1      Review of Systems  Constitutional: Positive for appetite change. Negative for fever, chills and fatigue.  HENT: Positive for sore throat and mouth sores. Negative for ear pain, congestion, rhinorrhea, trouble swallowing, neck pain, neck stiffness and voice change.   Eyes: Negative for pain and discharge.  Respiratory: Negative for cough, shortness of breath and wheezing.   Skin: Negative for color change and pallor.  Neurological: Negative for dizziness and headaches.    Allergies  Review of patient's allergies indicates no known allergies.  Home Medications   Current Outpatient Rx  Name  Route  Sig  Dispense  Refill  . amoxicillin (AMOXIL) 500 MG capsule   Oral   Take 1 capsule (500 mg total) by mouth 3 (three) times daily.   30 capsule   0   . naproxen (NAPROSYN) 500 MG tablet   Oral   Take 1 tablet (500 mg total) by mouth 2 (two) times daily.   30 tablet   0   . OVER THE COUNTER MEDICATION   Oral   Take 30 mLs by mouth 2 (two) times daily as needed. wal greens brand thera-flu For flu like  symptoms         . Prenatal Vit-Fe Fumarate-FA (PRENATAL PO)   Oral   Take 1 tablet by mouth daily.           BP 129/86  Pulse 83  Temp(Src) 98.9 F (37.2 C) (Oral)  Resp 20  SpO2 97%  LMP 09/19/2012  Breastfeeding? No  Physical Exam  Constitutional: She appears well-developed and well-nourished.  HENT:  Nose: Nose normal.  Mouth/Throat: Uvula is midline. Posterior oropharyngeal erythema present. No oropharyngeal exudate, posterior oropharyngeal edema or tonsillar abscesses.  Pulmonary/Chest: Effort normal and breath sounds normal.    ED Course  Procedures (including critical care time)  Labs Reviewed  POCT RAPID STREP A ([facility] ONLY) - Abnormal; Notable for the following:    Streptococcus, Group  A Screen (Direct) POSITIVE (*)    All other components within normal limits   No results found.   1. Strep pharyngitis       MDM  Uncomplicated streptococcal pharyngitis. Patient has been prescribed amoxicillin. Was instructed about symptoms to be watchful for case she needs to return for recheck. Patient agrees with treatment plan and followup care as necessary.        Jimmie Molly, MD 09/29/12 [phone]

## 2012-10-10 NOTE — ED Notes (Signed)
Message on answering machine, does not feel any better after completing Rx

## 2012-10-10 NOTE — ED Notes (Signed)
Call from patient on answering machine. C/o she is no better after Rx completed. Reviewed w Dr Caron Presume, who authorized Cefdinir 300 mg, PO , BID , quant #14 , called in to [facility], [address] at patient request, left message on answering machine

## 2012-10-10 NOTE — ED Notes (Signed)
Pt called was not able to pick up new script.  Prescription was called in again for cefdinir and was given to pharmacist. Mw,cma

## 2012-10-12 NOTE — ED Notes (Signed)
Chart review.

## 2014-02-26 ENCOUNTER — Encounter (HOSPITAL_COMMUNITY): Payer: Self-pay | Admitting: Emergency Medicine

## 2014-02-26 ENCOUNTER — Emergency Department (INDEPENDENT_AMBULATORY_CARE_PROVIDER_SITE_OTHER)
Admission: EM | Admit: 2014-02-26 | Discharge: 2014-02-26 | Disposition: A | Discharge status: Home / Self Care | Payer: BC Managed Care – PPO | Source: Home / Self Care | Attending: Family Medicine | Admitting: Family Medicine

## 2014-02-26 DIAGNOSIS — D234 Other benign neoplasm of skin of scalp and neck: Secondary | ICD-10-CM

## 2014-02-26 DIAGNOSIS — R21 Rash and other nonspecific skin eruption: Secondary | ICD-10-CM

## 2014-02-26 MED ORDER — METHYLPREDNISOLONE ACETATE 40 MG/ML IJ SUSP
INTRAMUSCULAR | Status: AC
  Filled 2014-02-26: qty 5

## 2014-02-26 MED ORDER — MUPIROCIN CALCIUM 2 % EX CREA: 1.0000 "application " | TOPICAL_CREAM | Freq: Two times a day (BID) | CUTANEOUS | Status: DC

## 2014-02-26 MED ORDER — BETAMETHASONE DIPROPIONATE AUG 0.05 % EX CREA: TOPICAL_CREAM | Freq: Two times a day (BID) | CUTANEOUS | Status: DC

## 2014-02-26 NOTE — ED Notes (Signed)
Pt c/o a cyst on back on neck x2 months; pain is 7/10 Also reports a small abscess on left buttocks x 82month Alert w/no signs of acute distress.

## 2014-02-26 NOTE — ED Provider Notes (Signed)
CSN: [id number]     Arrival date & time 02/26/14  1711 History   First MD Initiated Contact with Patient 02/26/14 1756     Chief Complaint  Patient presents with  . Cyst  . Abscess   (Consider location/radiation/quality/duration/timing/severity/associated sxs/prior Treatment) HPI Comments: 28 year old female presents complaining of a possible abscess or boil on the back of her neck as well as a rash on her left buttock. The rash has been there for over a month and it comes and goes. It is itching. It has not spread.the abscess or cyst on the back of her neck has been there for at least 2 years. It is minimally painful, she describes this as a sensation of pressure. It started out as hard in the size of a pea, it is now hard in the size of a marble. Nontender, no drainage. No similar lesions elsewhere. No systemic symptoms.no significant past medical history  Patient is a 28 y.o. female presenting with abscess.  Abscess   Past Medical History  Diagnosis Date  . Asthma   . H/O chlamydia infection 2010  . H/O varicella   . Abnormal Pap smear 2007    colpo  . Obese   . Fibroid   . Rubella non-immune status 01/11/2012   Past Surgical History  Procedure Laterality Date  . No past surgeries     Family History  Problem Relation Age of Onset  . Anesthesia problems Neg Hx   . Hypertension Mother   . Hypertension Father   . Alcohol abuse Father   . Heart disease Maternal Aunt   . Mental illness Maternal Aunt     bipolar  . Heart disease Maternal Grandmother   . Hypertension Maternal Grandmother   . Thrombophlebitis Maternal Grandmother   . Hypertension Paternal Grandmother   . Asthma Cousin   . Cancer Cousin   . Heart disease Cousin     congenital heart disease  . Learning disabilities Cousin    History  Substance Use Topics  . Smoking status: Never Smoker   . Smokeless tobacco: Never Used  . Alcohol Use: Yes     Comment: occasional   OB History   Grav Para Term Preterm  Abortions TAB SAB Ect Mult Living   1 1 1  0 0 0 0 0 0 1     Review of Systems  Skin: Positive for rash.       See history of present illness  All other systems reviewed and are negative.   Allergies  Review of patient's allergies indicates no known allergies.  Home Medications   Prior to Admission medications   Medication Sig Start Date End Date Taking? Authorizing Provider  augmented betamethasone dipropionate (DIPROLENE AF) 0.05 % cream Apply topically 2 (two) times daily. 02/26/14   Liam Graham, PA-C  mupirocin cream (BACTROBAN) 2 % Apply 1 application topically 2 (two) times daily. 02/26/14   Liam Graham, PA-C  naproxen (NAPROSYN) 500 MG tablet Take 1 tablet (500 mg total) by mouth 2 (two) times daily. 07/01/12   Dorie Rank, MD  OVER THE COUNTER MEDICATION Take 30 mLs by mouth 2 (two) times daily as needed. wal greens brand thera-flu For flu like symptoms    Historical Provider, MD  Prenatal Vit-Fe Fumarate-FA (PRENATAL PO) Take 1 tablet by mouth daily.    Historical Provider, MD   BP 114/81  Pulse 70  Temp(Src) 98.3 F (36.8 C) (Oral)  Resp 14  SpO2 100%  LMP 02/21/2014 Physical Exam  Nursing note and vitals reviewed. Constitutional: She is oriented to person, place, and time. Vital signs are normal. She appears well-developed and well-nourished. No distress.  HENT:  Head: Normocephalic and atraumatic.  Neck:    Pulmonary/Chest: Effort normal. No respiratory distress.  Neurological: She is alert and oriented to person, place, and time. She has normal strength. Coordination normal.  Skin: Skin is warm and dry. Rash noted. She is not diaphoretic.     Psychiatric: She has a normal mood and affect. Judgment normal.    ED Course  Procedures (including critical care time) Labs Review Labs Reviewed - No data to display  Imaging Review No results found.   MDM   1. Cyst, dermoid, scalp and neck   2. Rash    She will make an appointment with Gen. Surgery  to have the dermal cyst removed. Treat the other rash with mupirocin cream as well as betamethasone cream. She will exam her clothing to see if there is something touching her skin in that area such as a button as this has the appearance of localized contact dermatitis.  Meds ordered this encounter  Medications  . mupirocin cream (BACTROBAN) 2 %    Sig: Apply 1 application topically 2 (two) times daily.    Dispense:  30 g    Refill:  0    Order Specific Question:  Supervising Provider    Answer:  Melony Overly [id number]  . augmented betamethasone dipropionate (DIPROLENE AF) 0.05 % cream    Sig: Apply topically 2 (two) times daily.    Dispense:  30 g    Refill:  0    Order Specific Question:  Supervising Provider    Answer:  Melony Overly [place], PA-C 02/26/14 1904

## 2014-02-27 NOTE — ED Provider Notes (Signed)
Medical screening examination/treatment/procedure(s) were performed by resident physician or non-physician practitioner and as supervising physician I was immediately available for consultation/collaboration.   Pauline Good MD.   Billy Fischer, MD 02/27/14 1125

## 2014-03-02 ENCOUNTER — Encounter (INDEPENDENT_AMBULATORY_CARE_PROVIDER_SITE_OTHER): Payer: Self-pay | Admitting: General Surgery

## 2014-03-02 ENCOUNTER — Ambulatory Visit (INDEPENDENT_AMBULATORY_CARE_PROVIDER_SITE_OTHER): Payer: BC Managed Care – PPO | Admitting: General Surgery

## 2014-03-02 VITALS — BP 116/78 | HR 78 | Temp 98.5°F | Resp 16 | Ht 63.0 in | Wt 191.6 lb

## 2014-03-02 DIAGNOSIS — R221 Localized swelling, mass and lump, neck: Secondary | ICD-10-CM

## 2014-03-02 DIAGNOSIS — R220 Localized swelling, mass and lump, head: Secondary | ICD-10-CM

## 2014-03-02 HISTORY — DX: Localized swelling, mass and lump, neck: R22.1

## 2014-03-02 NOTE — Patient Instructions (Signed)
Main risks are bleeding, infection, pain.    No heavy lifting or strenuous activity for 1 week.    I will need to see you back 2-4 weeks after surgery.

## 2014-03-02 NOTE — Progress Notes (Signed)
Patient ID: Brenda [name], female   DOB: [birth year], 28 y.o.   MRN: [id number]  Chief Complaint  Patient presents with  . Other    Eval cyst on neck    HPI Brenda [name] is a 29 y.o. female.   HPI  Pt is a 28 yo F who has noted a mass on her posterior neck for around a year.  It has recently started to get larger, and she has started having neck and back pain.  She denies any drainage or redness of the mass.  She has not had fever/chills/night sweats.  She is otherwise well.  She is in school.  She states that her neck feels stiff in this area.    Past Medical History  Diagnosis Date  . Asthma   . H/O chlamydia infection 2010  . H/O varicella   . Abnormal Pap smear 2007    colpo  . Obese   . Fibroid   . Rubella non-immune status 01/11/2012    Past Surgical History  Procedure Laterality Date  . No past surgeries      Family History  Problem Relation Age of Onset  . Anesthesia problems Neg Hx   . Hypertension Mother   . Hypertension Father   . Alcohol abuse Father   . Heart disease Maternal Aunt   . Mental illness Maternal Aunt     bipolar  . Heart disease Maternal Grandmother   . Hypertension Maternal Grandmother   . Thrombophlebitis Maternal Grandmother   . Hypertension Paternal Grandmother   . Asthma Cousin   . Cancer Cousin   . Heart disease Cousin     congenital heart disease  . Learning disabilities Cousin     Social History History  Substance Use Topics  . Smoking status: Never Smoker   . Smokeless tobacco: Never Used  . Alcohol Use: Yes     Comment: occasional    No Known Allergies  Current Outpatient Prescriptions  Medication Sig Dispense Refill  . OVER THE COUNTER MEDICATION 2 (two) times daily. Cleanse and Detox tablet otc takes 1 tab in am and 1 tab in pm       No current facility-administered medications for this visit.    Review of Systems Review of Systems  Respiratory: Positive for wheezing.   All other systems reviewed and are  negative.   Blood pressure 116/78, pulse 78, temperature 98.5 F (36.9 C), temperature source Oral, resp. rate 16, height 5\' 3"  (1.6 m), weight 191 lb 9.6 oz (86.909 kg), last menstrual period 02/21/2014.  Physical Exam Physical Exam  Constitutional: She is oriented to person, place, and time. She appears well-developed and well-nourished. No distress.  HENT:  Head: Normocephalic and atraumatic.  Right Ear: External ear normal.  Left Ear: External ear normal.  Eyes: Conjunctivae are normal. Pupils are equal, round, and reactive to light. Right eye exhibits no discharge. Left eye exhibits no discharge. No scleral icterus.  Neck: Normal range of motion. Neck supple. No JVD present. Muscular tenderness present. No tracheal deviation present. No thyromegaly present.    2 cm mobile neck mass at inferior posterior neck in midline.  I cannot see a pore, and it feels separate from skin.    Cardiovascular: Normal rate and intact distal pulses.   Pulmonary/Chest: Effort normal. No stridor. No respiratory distress. She exhibits no tenderness.  Abdominal: Soft. She exhibits no distension. There is no tenderness.  Musculoskeletal: Normal range of motion.  Lymphadenopathy:    She has  no cervical adenopathy.  Neurological: She is alert and oriented to person, place, and time.  Skin: Skin is warm and dry. No rash noted. She is not diaphoretic. No erythema. No pallor.  Psychiatric: She has a normal mood and affect. Her behavior is normal. Judgment and thought content normal.    Assessment/Plan    Mass of neck We will schedule excision of this posterior neck mass.  She was advised that this may not take away all her neck and back pain, and it may make it temporarily worse.    It is still reasonable to excise the mass and it is tender and has been enlarging.   We discussed the risks of surgery including bleeding, infection, wound issues, and pain.  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  We will try to time surgery so she misses a minimal amount of school/work.   Her mother was present for the discussion.        Brenda [name] 03/02/2014, 1:57 PM

## 2014-03-02 NOTE — Assessment & Plan Note (Signed)
We will schedule excision of this posterior neck mass.  She was advised that this may not take away all her neck and back pain, and it may make it temporarily worse.    It is still reasonable to excise the mass and it is tender and has been enlarging.   We discussed the risks of surgery including bleeding, infection, wound issues, and pain.  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  We will try to time surgery so she misses a minimal amount of school/work.   Her mother was present for the discussion.

## 2014-03-16 ENCOUNTER — Telehealth (INDEPENDENT_AMBULATORY_CARE_PROVIDER_SITE_OTHER): Payer: Self-pay | Admitting: General Surgery

## 2014-03-16 NOTE — Telephone Encounter (Signed)
Called patient to schedule surgery, went over financial responsibilities, patient will call back to schedule.

## 2014-06-11 ENCOUNTER — Encounter (INDEPENDENT_AMBULATORY_CARE_PROVIDER_SITE_OTHER): Payer: Self-pay | Admitting: General Surgery

## 2014-09-18 ENCOUNTER — Emergency Department (HOSPITAL_COMMUNITY)
Admission: EM | Admit: 2014-09-18 | Discharge: 2014-09-18 | Disposition: A | Discharge status: Home / Self Care | Payer: BLUE CROSS/BLUE SHIELD | Attending: Emergency Medicine | Admitting: Emergency Medicine

## 2014-09-18 ENCOUNTER — Encounter (HOSPITAL_COMMUNITY): Payer: Self-pay | Admitting: *Deleted

## 2014-09-18 DIAGNOSIS — Z87898 Personal history of other specified conditions: Secondary | ICD-10-CM | POA: Diagnosis not present

## 2014-09-18 DIAGNOSIS — J029 Acute pharyngitis, unspecified: Secondary | ICD-10-CM | POA: Diagnosis present

## 2014-09-18 DIAGNOSIS — Z8742 Personal history of other diseases of the female genital tract: Secondary | ICD-10-CM | POA: Diagnosis not present

## 2014-09-18 DIAGNOSIS — Z79899 Other long term (current) drug therapy: Secondary | ICD-10-CM | POA: Insufficient documentation

## 2014-09-18 DIAGNOSIS — E669 Obesity, unspecified: Secondary | ICD-10-CM | POA: Insufficient documentation

## 2014-09-18 DIAGNOSIS — Z8619 Personal history of other infectious and parasitic diseases: Secondary | ICD-10-CM | POA: Diagnosis not present

## 2014-09-18 DIAGNOSIS — J45909 Unspecified asthma, uncomplicated: Secondary | ICD-10-CM | POA: Insufficient documentation

## 2014-09-18 LAB — RAPID STREP SCREEN (MED CTR MEBANE ONLY): Streptococcus, Group A Screen (Direct): NEGATIVE

## 2014-09-18 NOTE — ED Provider Notes (Signed)
CSN: [id number]     Arrival date & time 09/18/14  1827 History  This chart was scribed for non-physician practitioner, Clayton Bibles, PA-C working with Hoy Morn, MD by Frederich Balding, ED scribe. This patient was seen in room TR06C/TR06C and the patient's care was started at 8:32 PM.    Chief Complaint  Patient presents with  . Sore Throat   The history is provided by the patient. No language interpreter was used.    HPI Comments: Brenda [name] is a 29 y.o. female who presents to the Emergency Department complaining of sore throat that started one week ago. Pain is described as soreness, 5/10 intensity, worse with swallowing.  She also reports congestion, chills and generalized body aches. Pt noticed a white spot on the back of her throat last night. She has taken OTC cold medications with temporary relief. Pt denies fever, rhinorrhea, cough, SOB, chest pain, leg swelling, abdominal pain, nausea, emesis, diarrhea, dysuria, urinary frequency, urgency, hematuria. Her boyfriend was recently diagnosed with URI.  Past Medical History  Diagnosis Date  . Asthma   . H/O chlamydia infection 2010  . H/O varicella   . Abnormal Pap smear 2007    colpo  . Obese   . Fibroid   . Rubella non-immune status 01/11/2012   Past Surgical History  Procedure Laterality Date  . No past surgeries     Family History  Problem Relation Age of Onset  . Anesthesia problems Neg Hx   . Hypertension Mother   . Hypertension Father   . Alcohol abuse Father   . Heart disease Maternal Aunt   . Mental illness Maternal Aunt     bipolar  . Heart disease Maternal Grandmother   . Hypertension Maternal Grandmother   . Thrombophlebitis Maternal Grandmother   . Hypertension Paternal Grandmother   . Asthma Cousin   . Cancer Cousin   . Heart disease Cousin     congenital heart disease  . Learning disabilities Cousin    History  Substance Use Topics  . Smoking status: Never Smoker   . Smokeless tobacco: Never Used   . Alcohol Use: Yes     Comment: occasional   OB History    Gravida Para Term Preterm AB TAB SAB Ectopic Multiple Living   1 1 1  0 0 0 0 0 0 1     Review of Systems  Constitutional: Positive for chills. Negative for fever.  HENT: Positive for congestion and sore throat. Negative for rhinorrhea.   Respiratory: Negative for cough and shortness of breath.   Cardiovascular: Negative for chest pain and leg swelling.  Gastrointestinal: Negative for nausea, vomiting, abdominal pain and diarrhea.  Genitourinary: Negative for dysuria, urgency, frequency and hematuria.  Musculoskeletal: Positive for myalgias.  All other systems reviewed and are negative.  Allergies  Review of patient's allergies indicates no known allergies.  Home Medications   Prior to Admission medications   Medication Sig Start Date End Date Taking? Authorizing Provider  OVER THE COUNTER MEDICATION 2 (two) times daily. Cleanse and Detox tablet otc takes 1 tab in am and 1 tab in pm    Historical Provider, MD   BP 114/75 mmHg  Pulse 85  Temp(Src) 98.4 F (36.9 C) (Oral)  Resp 14  SpO2 98%  LMP 09/01/2014   Physical Exam  Constitutional: She appears well-developed and well-nourished. No distress.  HENT:  Head: Normocephalic and atraumatic.  Mouth/Throat: Oropharyngeal exudate, posterior oropharyngeal edema and posterior oropharyngeal erythema present.  No  signs of peritonsillar abscess.  Eyes: Conjunctivae are normal.  Neck: Normal range of motion. Neck supple.  Cardiovascular: Normal rate and regular rhythm.   Pulmonary/Chest: Effort normal and breath sounds normal. No stridor. No respiratory distress. She has no wheezes. She has no rales.  Lymphadenopathy:    She has no cervical adenopathy.  Neurological: She is alert.  Skin: She is not diaphoretic.  Nursing note and vitals reviewed.   ED Course  Procedures (including critical care time)  DIAGNOSTIC STUDIES: Oxygen Saturation is 98% on RA, normal by  my interpretation.    COORDINATION OF CARE: 8:34 PM-Advised pt of strep results. Discussed treatment plan which includes OTC medications with pt at bedside and pt agreed to plan.   Labs Review Labs Reviewed  RAPID STREP SCREEN  CULTURE, GROUP A STREP    Imaging Review No results found.   EKG Interpretation None      MDM   Final diagnoses:  Pharyngitis   Afebrile, nontoxic patient with constellation of symptoms suggestive of viral syndrome, primary sore throat.  No airway concerns.  Strep screen negative.  Culture pending.  No concerning findings on exam.  Discharged home with supportive care (pt declines more than OTC medications), PCP follow up. Discussed result, findings, treatment, and follow up  with patient.  Pt given return precautions.  Pt verbalizes understanding and agrees with plan.      I personally performed the services described in this documentation, which was scribed in my presence. The recorded information has been reviewed and is accurate.   Clayton Bibles, PA-C 09/18/14 2109  Hoy Morn, MD 09/18/14 2149

## 2014-09-18 NOTE — Discharge Instructions (Signed)
°  Read the information below.  You may return to the Emergency Department at any time for worsening condition or any new symptoms that concern you.  If you develop high fevers, difficulty swallowing or breathing, or you are unable to tolerate fluids by mouth, return to the ER immediately for a recheck.      Pharyngitis Pharyngitis is redness, pain, and swelling (inflammation) of your pharynx.  CAUSES  Pharyngitis is usually caused by infection. Most of the time, these infections are from viruses (viral) and are part of a cold. However, sometimes pharyngitis is caused by bacteria (bacterial). Pharyngitis can also be caused by allergies. Viral pharyngitis may be spread from person to person by coughing, sneezing, and personal items or utensils (cups, forks, spoons, toothbrushes). Bacterial pharyngitis may be spread from person to person by more intimate contact, such as kissing.  SIGNS AND SYMPTOMS  Symptoms of pharyngitis include:   Sore throat.   Tiredness (fatigue).   Low-grade fever.   Headache.  Joint pain and muscle aches.  Skin rashes.  Swollen lymph nodes.  Plaque-like film on throat or tonsils (often seen with bacterial pharyngitis). DIAGNOSIS  Your health care provider will ask you questions about your illness and your symptoms. Your medical history, along with a physical exam, is often all that is needed to diagnose pharyngitis. Sometimes, a rapid strep test is done. Other lab tests may also be done, depending on the suspected cause.  TREATMENT  Viral pharyngitis will usually get better in 3-4 days without the use of medicine. Bacterial pharyngitis is treated with medicines that kill germs (antibiotics).  HOME CARE INSTRUCTIONS   Drink enough water and fluids to keep your urine clear or pale yellow.   Only take over-the-counter or prescription medicines as directed by your health care provider:   If you are prescribed antibiotics, make sure you finish them even if  you start to feel better.   Do not take aspirin.   Get lots of rest.   Gargle with 8 oz of salt water ( tsp of salt per 1 qt of water) as often as every 1-2 hours to soothe your throat.   Throat lozenges (if you are not at risk for choking) or sprays may be used to soothe your throat. SEEK MEDICAL CARE IF:   You have large, tender lumps in your neck.  You have a rash.  You cough up green, yellow-brown, or bloody spit. SEEK IMMEDIATE MEDICAL CARE IF:   Your neck becomes stiff.  You drool or are unable to swallow liquids.  You vomit or are unable to keep medicines or liquids down.  You have severe pain that does not go away with the use of recommended medicines.  You have trouble breathing (not caused by a stuffy nose). MAKE SURE YOU:   Understand these instructions.  Will watch your condition.  Will get help right away if you are not doing well or get worse. Document Released: 07/27/2005 Document Revised: 05/17/2013 Document Reviewed: 04/03/2013 [facility] Patient Information 2015 [place], [place]. This information is not intended to replace advice given to you by your health care provider. Make sure you discuss any questions you have with your health care provider.

## 2014-09-18 NOTE — ED Notes (Signed)
Noticed a Brenda [name] spot on back of throat. Sore throat for a week. Taking sudafed but ineffective.

## 2014-09-20 LAB — CULTURE, GROUP A STREP

## 2015-04-01 ENCOUNTER — Emergency Department (HOSPITAL_COMMUNITY)
Admission: EM | Admit: 2015-04-01 | Discharge: 2015-04-01 | Disposition: A | Discharge status: Home / Self Care | Payer: BLUE CROSS/BLUE SHIELD | Attending: Emergency Medicine | Admitting: Emergency Medicine

## 2015-04-01 ENCOUNTER — Encounter (HOSPITAL_COMMUNITY): Payer: Self-pay | Admitting: Emergency Medicine

## 2015-04-01 DIAGNOSIS — Z7729 Contact with and (suspected ) exposure to other hazardous substances: Secondary | ICD-10-CM

## 2015-04-01 DIAGNOSIS — T5891XA Toxic effect of carbon monoxide from unspecified source, accidental (unintentional), initial encounter: Secondary | ICD-10-CM | POA: Insufficient documentation

## 2015-04-01 DIAGNOSIS — Y998 Other external cause status: Secondary | ICD-10-CM | POA: Insufficient documentation

## 2015-04-01 DIAGNOSIS — Z8619 Personal history of other infectious and parasitic diseases: Secondary | ICD-10-CM | POA: Insufficient documentation

## 2015-04-01 DIAGNOSIS — Y9389 Activity, other specified: Secondary | ICD-10-CM | POA: Insufficient documentation

## 2015-04-01 DIAGNOSIS — Z86018 Personal history of other benign neoplasm: Secondary | ICD-10-CM | POA: Insufficient documentation

## 2015-04-01 DIAGNOSIS — X58XXXA Exposure to other specified factors, initial encounter: Secondary | ICD-10-CM | POA: Insufficient documentation

## 2015-04-01 DIAGNOSIS — J45909 Unspecified asthma, uncomplicated: Secondary | ICD-10-CM | POA: Insufficient documentation

## 2015-04-01 DIAGNOSIS — Y9289 Other specified places as the place of occurrence of the external cause: Secondary | ICD-10-CM | POA: Insufficient documentation

## 2015-04-01 DIAGNOSIS — R42 Dizziness and giddiness: Secondary | ICD-10-CM | POA: Insufficient documentation

## 2015-04-01 DIAGNOSIS — E669 Obesity, unspecified: Secondary | ICD-10-CM | POA: Insufficient documentation

## 2015-04-01 LAB — I-STAT CHEM 8, ED
BUN: 18 mg/dL (ref 6–20)
CALCIUM ION: 1.15 mmol/L (ref 1.12–1.23)
Chloride: 102 mmol/L (ref 101–111)
Creatinine, Ser: 1 mg/dL (ref 0.44–1.00)
Glucose, Bld: 95 mg/dL (ref 65–99)
HCT: 40 % (ref 36.0–46.0)
Hemoglobin: 13.6 g/dL (ref 12.0–15.0)
Potassium: 4.1 mmol/L (ref 3.5–5.1)
SODIUM: 139 mmol/L (ref 135–145)
TCO2: 24 mmol/L (ref 0–100)

## 2015-04-01 LAB — CARBOXYHEMOGLOBIN
Carboxyhemoglobin: 1.8 % — ABNORMAL HIGH (ref 0.5–1.5)
Methemoglobin: 1.5 % (ref 0.0–1.5)
O2 Saturation: 42.3 %
Total hemoglobin: 13 g/dL (ref 12.0–16.0)

## 2015-04-01 NOTE — Discharge Instructions (Signed)
Carboxyhemoglobin, COHb This is a blood test used to determine carbon monoxide poisoning. It measures the amount of serum COHb, which is formed when the hemoglobin (oxygen carrying part of the blood) combines with carbon monoxide.  PREPARATION FOR TEST No preparation or fasting is necessary. NORMAL FINDINGS  Adults: <2.3% (0.023)  Adult smokers: 2.1% - 4.2% (0.021 - 0.042)  Adult heavy smokers (more than 2 packs/day): 8% - 9% (0.08 - 0.09)  Hemolytic anemia: Up to 4%  Newborn: greater than 12% Ranges for normal findings may vary among different laboratories and hospitals. You should always check with your doctor after having lab work or other tests done to discuss the meaning of your test results and whether your values are considered within normal limits. MEANING OF TEST  Your caregiver will go over the test results with you and discuss the importance and meaning of your results, as well as treatment options and the need for additional tests if necessary. OBTAINING THE TEST RESULTS It is your responsibility to obtain your test results. Ask the lab or department performing the test when and how you will get your results. Document Released: 08/18/2004 Document Revised: 10/19/2011 Document Reviewed: 07/04/2008 [facility] Patient Information 2015 [place], [place]. This information is not intended to replace advice given to you by your health care provider. Make sure you discuss any questions you have with your health care provider.

## 2015-04-01 NOTE — ED Notes (Addendum)
Called poison control spoke with CJ, reported labs and situation.  Poison control said that protocol is to have on high flow oxygen for 4 hours.  If vitals are stable pt can go home.  Pt arrived at [place] and has been on non-rebreather.  Dr Randal Buba notified.  PO fluid challenged with no issues or side effects.

## 2015-04-01 NOTE — ED Provider Notes (Signed)
CSN: [id number]     Arrival date & time 04/01/15  0008 History   This chart was scribed for Ladena Jacquez, MD by Forrestine Him, ED Scribe. This patient was seen in room B18C/B18C and the patient's care was started 1:49 AM.   Chief Complaint  Patient presents with  . Toxic Inhalation    Carbon Monoxide poisoning   The history is provided by the patient. No language interpreter was used.    HPI Comments: CAROLLYNN PENNYWELL is a 29 y.o. female with a PMHx of asthma who presents to the Emergency Department here for carbon monoxide exposure this evening. Pt states the gas stove had been on since 1500 as she was cooking a full meal. Pt took a nap at approximately 1400 for 4-5 hours. Carbon monoxide alarm went off at approximately 1900. Pt states she experienced some dizziness but states this has improved. No recent fever or chills. No known allergies to medications.  Past Medical History  Diagnosis Date  . Asthma   . H/O chlamydia infection 2010  . H/O varicella   . Abnormal Pap smear 2007    colpo  . Obese   . Fibroid   . Rubella non-immune status 01/11/2012   Past Surgical History  Procedure Laterality Date  . No past surgeries     Family History  Problem Relation Age of Onset  . Anesthesia problems Neg Hx   . Hypertension Mother   . Hypertension Father   . Alcohol abuse Father   . Heart disease Maternal Aunt   . Mental illness Maternal Aunt     bipolar  . Heart disease Maternal Grandmother   . Hypertension Maternal Grandmother   . Thrombophlebitis Maternal Grandmother   . Hypertension Paternal Grandmother   . Asthma Cousin   . Cancer Cousin   . Heart disease Cousin     congenital heart disease  . Learning disabilities Cousin    Social History  Substance Use Topics  . Smoking status: Never Smoker   . Smokeless tobacco: Never Used  . Alcohol Use: Yes     Comment: occasional   OB History    Gravida Para Term Preterm AB TAB SAB Ectopic Multiple Living   1 1 1  0 0 0 0 0 0  1     Review of Systems  Constitutional: Negative for fever and chills.  Respiratory: Negative for cough and shortness of breath.   Cardiovascular: Negative for chest pain.  Gastrointestinal: Negative for nausea, vomiting, abdominal pain and diarrhea.  Musculoskeletal: Negative for back pain.  Skin: Negative for rash.  Neurological: Positive for dizziness. Negative for seizures, syncope, facial asymmetry, weakness, numbness and headaches.  Psychiatric/Behavioral: Negative for confusion.  All other systems reviewed and are negative.     Allergies  Review of patient's allergies indicates no known allergies.  Home Medications   Prior to Admission medications   Medication Sig Start Date End Date Taking? Authorizing Provider  OVER THE COUNTER MEDICATION 2 (two) times daily. Cleanse and Detox tablet otc takes 1 tab in am and 1 tab in pm    Historical Provider, MD   Triage Vitals: BP 101/73 mmHg  Pulse 74  Temp(Src) 97.5 F (36.4 C) (Oral)  Resp 16  Ht 5\' 3"  (1.6 m)  Wt 198 lb (89.812 kg)  BMI 35.08 kg/m2  SpO2 100%  LMP 03/25/2015   Physical Exam  Constitutional: She is oriented to person, place, and time. She appears well-developed and well-nourished. No distress.  HENT:  Head: Normocephalic and atraumatic.  Mouth/Throat: Oropharynx is clear and moist. No oropharyngeal exudate.  Eyes: Conjunctivae and EOM are normal. Pupils are equal, round, and reactive to light.  Neck: Normal range of motion.  Cardiovascular: Normal rate, regular rhythm, normal heart sounds and intact distal pulses.   Pulmonary/Chest: Effort normal and breath sounds normal. No respiratory distress. She has no wheezes. She has no rales.  Abdominal: Soft. Bowel sounds are normal. She exhibits no distension. There is no tenderness. There is no rebound and no guarding.  Musculoskeletal: Normal range of motion.  Neurological: She is alert and oriented to person, place, and time. She has normal reflexes. No  cranial nerve deficit.  Skin: Skin is warm and dry.  Psychiatric: She has a normal mood and affect. Judgment normal.  Nursing note and vitals reviewed.   ED Course  Procedures (including critical care time)  DIAGNOSTIC STUDIES: Oxygen Saturation is 100% on RA, Normal by my interpretation.    COORDINATION OF CARE: 1:49 AM- Will order blood work. Discussed treatment plan with pt at bedside and pt agreed to plan.       Labs Review Labs Reviewed - No data to display  Imaging Review No results found. I have personally reviewed and evaluated these images and lab results as part of my medical decision-making.   EKG Interpretation None      MDM   Final diagnoses:  None    See poison control note.  On x 4 hours PO challenged successfully safe for discharge at this time   I personally performed the services described in this documentation, which was scribed in my presence. The recorded information has been reviewed and is accurate.    Veatrice Kells, MD 04/01/15 [phone]

## 2015-04-01 NOTE — ED Notes (Signed)
Pt stable, ambulatory, states understanding of discharge instructions 
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
## 2015-06-16 ENCOUNTER — Inpatient Hospital Stay (HOSPITAL_COMMUNITY)
Admission: AD | Admit: 2015-06-16 | Discharge: 2015-06-16 | Disposition: A | Discharge status: Home / Self Care | Payer: BLUE CROSS/BLUE SHIELD | Source: Ambulatory Visit | Attending: Obstetrics and Gynecology | Admitting: Obstetrics and Gynecology

## 2015-06-16 ENCOUNTER — Encounter (HOSPITAL_COMMUNITY): Payer: Self-pay | Admitting: *Deleted

## 2015-06-16 ENCOUNTER — Inpatient Hospital Stay (HOSPITAL_COMMUNITY): Payer: BLUE CROSS/BLUE SHIELD

## 2015-06-16 DIAGNOSIS — O26811 Pregnancy related exhaustion and fatigue, first trimester: Secondary | ICD-10-CM | POA: Diagnosis not present

## 2015-06-16 DIAGNOSIS — Z3A00 Weeks of gestation of pregnancy not specified: Secondary | ICD-10-CM | POA: Insufficient documentation

## 2015-06-16 DIAGNOSIS — R002 Palpitations: Secondary | ICD-10-CM | POA: Insufficient documentation

## 2015-06-16 DIAGNOSIS — O3411 Maternal care for benign tumor of corpus uteri, first trimester: Secondary | ICD-10-CM | POA: Diagnosis not present

## 2015-06-16 DIAGNOSIS — D259 Leiomyoma of uterus, unspecified: Secondary | ICD-10-CM

## 2015-06-16 DIAGNOSIS — O26899 Other specified pregnancy related conditions, unspecified trimester: Secondary | ICD-10-CM

## 2015-06-16 DIAGNOSIS — O3410 Maternal care for benign tumor of corpus uteri, unspecified trimester: Secondary | ICD-10-CM

## 2015-06-16 DIAGNOSIS — R102 Pelvic and perineal pain: Secondary | ICD-10-CM | POA: Diagnosis not present

## 2015-06-16 DIAGNOSIS — R5383 Other fatigue: Secondary | ICD-10-CM | POA: Diagnosis present

## 2015-06-16 HISTORY — DX: Unspecified abnormal cytological findings in specimens from vagina: R87.629

## 2015-06-16 HISTORY — DX: Localized swelling, mass and lump, neck: R22.1

## 2015-06-16 LAB — COMPREHENSIVE METABOLIC PANEL
ALK PHOS: 30 U/L — AB (ref 38–126)
ALT: 25 U/L (ref 14–54)
ANION GAP: 8 (ref 5–15)
AST: 21 U/L (ref 15–41)
Albumin: 4 g/dL (ref 3.5–5.0)
BILIRUBIN TOTAL: 0.5 mg/dL (ref 0.3–1.2)
BUN: 20 mg/dL (ref 6–20)
CALCIUM: 8.9 mg/dL (ref 8.9–10.3)
CO2: 24 mmol/L (ref 22–32)
CREATININE: 0.84 mg/dL (ref 0.44–1.00)
Chloride: 105 mmol/L (ref 101–111)
Glucose, Bld: 99 mg/dL (ref 65–99)
Potassium: 4.2 mmol/L (ref 3.5–5.1)
SODIUM: 137 mmol/L (ref 135–145)
TOTAL PROTEIN: 7.5 g/dL (ref 6.5–8.1)

## 2015-06-16 LAB — URINALYSIS, ROUTINE W REFLEX MICROSCOPIC
BILIRUBIN URINE: NEGATIVE
GLUCOSE, UA: NEGATIVE mg/dL
Hgb urine dipstick: NEGATIVE
KETONES UR: NEGATIVE mg/dL
LEUKOCYTES UA: NEGATIVE
NITRITE: NEGATIVE
PH: 6 (ref 5.0–8.0)
Protein, ur: NEGATIVE mg/dL
Urobilinogen, UA: 0.2 mg/dL (ref 0.0–1.0)

## 2015-06-16 LAB — CBC
HCT: 38.3 % (ref 36.0–46.0)
HEMOGLOBIN: 13 g/dL (ref 12.0–15.0)
MCH: 27.9 pg (ref 26.0–34.0)
MCHC: 33.9 g/dL (ref 30.0–36.0)
MCV: 82.2 fL (ref 78.0–100.0)
Platelets: 243 10*3/uL (ref 150–400)
RBC: 4.66 MIL/uL (ref 3.87–5.11)
RDW: 14 % (ref 11.5–15.5)
WBC: 4.5 10*3/uL (ref 4.0–10.5)

## 2015-06-16 LAB — POCT PREGNANCY, URINE: Preg Test, Ur: POSITIVE — AB

## 2015-06-16 LAB — WET PREP, GENITAL
Clue Cells Wet Prep HPF POC: NONE SEEN
Trich, Wet Prep: NONE SEEN
YEAST WET PREP: NONE SEEN

## 2015-06-16 LAB — TSH: TSH: 1.381 u[IU]/mL (ref 0.350–4.500)

## 2015-06-16 LAB — HCG, QUANTITATIVE, PREGNANCY: hCG, Beta Chain, Quant, S: 98 m[IU]/mL — ABNORMAL HIGH (ref <5)

## 2015-06-16 NOTE — MAU Note (Addendum)
Been SOB when active, very tired. Felt dizzy one night like i might pass out but that passed and no problems since with that. Period was suppose to start Thurs and has not started. Did upt but line was so faint unsure if it was positive or not

## 2015-06-16 NOTE — MAU Provider Note (Signed)
History   29 yo G21001 presented unannounced c/o fatigue, with SOB on exertion, late cycle x 3 days, pelvic pain x several days, occasional episode of palpitations.  Denies chest pain, dysuria, fever, vaginal bleeding or d/c.  Had ? faint positive UPT at home today.  Hx of irregular cycles in past, but cycles have been more regular in last year.  Last seen at [facility] 05/2014, no contraception, pregnancy would be acceptable.  Patient Active Problem List   Diagnosis Date Noted  . Seasonal asthma 08/06/2011  . Obese 08/06/2011  . Fibroids 08/06/2011    Chief Complaint  Patient presents with  . Shortness of Breath  . Amenorrhea  . Palpitations  . Pelvic Pain   HPI:  See above  OB History    Gravida Para Term Preterm AB TAB SAB Ectopic Multiple Living   2 1 1  0 0 0 0 0 0 1      Past Medical History  Diagnosis Date  . Asthma   . H/O chlamydia infection 2010  . H/O varicella   . Abnormal Pap smear 2007    colpo  . Obese   . Fibroid   . Rubella non-immune status 01/11/2012  . Mass of neck 03/02/2014    Past Surgical History  Procedure Laterality Date  . No past surgeries      Family History  Problem Relation Age of Onset  . Anesthesia problems Neg Hx   . Hypertension Mother   . Hypertension Father   . Alcohol abuse Father   . Heart disease Maternal Aunt   . Mental illness Maternal Aunt     bipolar  . Heart disease Maternal Grandmother   . Hypertension Maternal Grandmother   . Thrombophlebitis Maternal Grandmother   . Hypertension Paternal Grandmother   . Asthma Cousin   . Cancer Cousin   . Heart disease Cousin     congenital heart disease  . Learning disabilities Cousin     Social History  Substance Use Topics  . Smoking status: Never Smoker   . Smokeless tobacco: Never Used  . Alcohol Use: Yes     Comment: occasional    Allergies: No Known Allergies  Prescriptions prior to admission  Medication Sig Dispense Refill Last Dose  . Multiple Vitamin  (MULTIVITAMIN WITH MINERALS) TABS tablet Take 1 tablet by mouth daily.   03/30/2015    ROS:  Fatigue, pelvic pain, amenorrhea Physical Exam   Blood pressure 127/81, pulse 95, temperature 98.4 F (36.9 C), height 5\' 3"  (1.6 m), weight 98.431 kg (217 lb), last menstrual period 05/16/2015, SpO2 100 %.    Physical Exam  In NAD Chest clear Heart RRR without murmur Abd soft, NT, upper abdomen with pannus Pelvic--uterus slightly enlarged, NT, cervix closed, small amount white d/c in vault, no CMT Ext WNL  UPT +.  ED Course  Assessment: Early pregnancy Fatigue Pelvic pain Sporadic palpitations  Plan: QHCG CBC, CMP, TSH GC/chlamydia Pelvic US after QHCG resulted   Donnel Saxon CNM, MSN 06/16/2015 7:18 AM   Addendum: Returned from [place]: FINDINGS: Intrauterine gestational sac: None.  Yolk sac: None.  Embryo: None.  Cardiac Activity: None.  Heart Rate: N/A  Maternal uterus/adnexae: There are 2 lesions in the uterus which are heterogeneously echogenic and demonstrates some shadowing, most compatible with calcified fibroids, measuring 2.0 x 1.8 x 1.9 cm in the mid body, and 2.0 x 1.7 x 1.7 cm in the fundus. Bilateral ovaries are normal in appearance. No free fluid in the cul-de-sac.  IMPRESSION: 1. No IUP identified at this time. 2. Fibroid uterus, as above. 3. No acute findings.   Results for orders placed or performed during the hospital encounter of 06/16/15 (from the past 24 hour(s))  Urinalysis, Routine w reflex microscopic (not at [facility])     Status: Abnormal   Collection Time: 06/16/15  6:42 AM  Result Value Ref Range   Color, Urine YELLOW YELLOW   APPearance CLEAR CLEAR   Specific Gravity, Urine >1.030 (H) 1.005 - 1.030   pH 6.0 5.0 - 8.0   Glucose, UA NEGATIVE NEGATIVE mg/dL   Hgb urine dipstick NEGATIVE NEGATIVE   Bilirubin Urine NEGATIVE NEGATIVE   Ketones, ur NEGATIVE NEGATIVE mg/dL   Protein, ur NEGATIVE NEGATIVE mg/dL   Urobilinogen, UA  0.2 0.0 - 1.0 mg/dL   Nitrite NEGATIVE NEGATIVE   Leukocytes, UA NEGATIVE NEGATIVE  Pregnancy, urine POC     Status: Abnormal   Collection Time: 06/16/15  7:12 AM  Result Value Ref Range   Preg Test, Ur POSITIVE (A) NEGATIVE  CBC     Status: None   Collection Time: 06/16/15  7:25 AM  Result Value Ref Range   WBC 4.5 4.0 - 10.5 K/uL   RBC 4.66 3.87 - 5.11 MIL/uL   Hemoglobin 13.0 12.0 - 15.0 g/dL   HCT 38.3 36.0 - 46.0 %   MCV 82.2 78.0 - 100.0 fL   MCH 27.9 26.0 - 34.0 pg   MCHC 33.9 30.0 - 36.0 g/dL   RDW 14.0 11.5 - 15.5 %   Platelets 243 150 - 400 K/uL  Comprehensive metabolic panel     Status: Abnormal   Collection Time: 06/16/15  7:25 AM  Result Value Ref Range   Sodium 137 135 - 145 mmol/L   Potassium 4.2 3.5 - 5.1 mmol/L   Chloride 105 101 - 111 mmol/L   CO2 24 22 - 32 mmol/L   Glucose, Bld 99 65 - 99 mg/dL   BUN 20 6 - 20 mg/dL   Creatinine, Ser 0.84 0.44 - 1.00 mg/dL   Calcium 8.9 8.9 - 10.3 mg/dL   Total Protein 7.5 6.5 - 8.1 g/dL   Albumin 4.0 3.5 - 5.0 g/dL   AST 21 15 - 41 U/L   ALT 25 14 - 54 U/L   Alkaline Phosphatase 30 (L) 38 - 126 U/L   Total Bilirubin 0.5 0.3 - 1.2 mg/dL   GFR calc non Af Amer >60 >60 mL/min   GFR calc Af Amer >60 >60 mL/min   Anion gap 8 5 - 15  TSH     Status: None   Collection Time: 06/16/15  7:25 AM  Result Value Ref Range   TSH 1.381 0.350 - 4.500 uIU/mL  hCG, quantitative, pregnancy     Status: Abnormal   Collection Time: 06/16/15  7:25 AM  Result Value Ref Range   hCG, Beta Chain, Quant, S 98 (H) <5 mIU/mL  Wet prep, genital     Status: Abnormal   Collection Time: 06/16/15  7:35 AM  Result Value Ref Range   Yeast Wet Prep HPF POC NONE SEEN NONE SEEN   Trich, Wet Prep NONE SEEN NONE SEEN   Clue Cells Wet Prep HPF POC NONE SEEN NONE SEEN   WBC, Wet Prep HPF POC FEW (A) NONE SEEN   Impression: Early pregnancy sx Small fibroids  Plan: D/C home with normal pregnancy precautions. Plan f/u [facility] on 06/21/15 at [place].   Office will call to set this up.  Patient to initiate prenatal care arrangements. I will f/u on TSH and cultures from today.  Donnel Saxon, CNM 06/16/15 12p

## 2015-06-16 NOTE — Discharge Instructions (Signed)

## 2015-06-17 LAB — GC/CHLAMYDIA PROBE AMP (~~LOC~~) NOT AT ARMC
Chlamydia: NEGATIVE
NEISSERIA GONORRHEA: NEGATIVE

## 2015-06-20 ENCOUNTER — Ambulatory Visit (INDEPENDENT_AMBULATORY_CARE_PROVIDER_SITE_OTHER): Payer: BLUE CROSS/BLUE SHIELD | Admitting: Internal Medicine

## 2015-06-20 ENCOUNTER — Encounter: Payer: Self-pay | Admitting: Internal Medicine

## 2015-06-20 DIAGNOSIS — Z0000 Encounter for general adult medical examination without abnormal findings: Secondary | ICD-10-CM

## 2015-06-20 MED ORDER — ALBUTEROL SULFATE HFA 108 (90 BASE) MCG/ACT IN AERS: 2.0000 | INHALATION_SPRAY | Freq: Four times a day (QID) | RESPIRATORY_TRACT | Status: DC | PRN

## 2015-06-20 NOTE — Patient Instructions (Signed)
I have given you a prescription for an albuterol inhaler.  Discuss this with your Ob tomorrow before filling.  Most likely you will not need this - your symptoms are likely from the pregnancy.  Exercise regularly, try to limit your weight gain during pregnancy and eat a low sugar/carbohydrate diet to prevent the gestational diabetes.   Followup as needed

## 2015-06-20 NOTE — Progress Notes (Signed)
Subjective:    Patient ID: Brenda [name], female    DOB: 04/14/1986, 29 y.o.   MRN: [id number]  HPI She is here to establish with a new pcp. She has not had a pcp in a while.  Initially when she made the appointment she was having some sob and palpitations, but those have resolved.  She also found out she was pregnant and that was likely the cause of the symptoms.   She is [redacted] weeks pregnant and is following with ob/gyn.  She has had some increased sob and cough recently.  She sometimes feels like she needs to stop walking a take a deep breath - she has a history of asthma related to seasonal allergies.  She typically takes claritin as needed.  She has not needed an inhaler for years.     She has been experiencing some abdominal discomfort with weight gain.  She discussed this with her ob/gyn and they mentioned she may have a hernia.  She does feel that her abdomen is larger in the middle.  She first noticed this with her first pregnancy and it was better with weight loss.   Medications and allergies reviewed with patient and updated if appropriate.  Patient Active Problem List   Diagnosis Date Noted  . Seasonal asthma 08/06/2011  . Obese 08/06/2011  . Fibroids 08/06/2011    No current outpatient prescriptions on file prior to visit.   No current facility-administered medications on file prior to visit.    Past Medical History  Diagnosis Date  . Asthma   . H/O chlamydia infection 2010  . H/O varicella   . Abnormal Pap smear 2007    colpo  . Obese   . Fibroid   . Rubella non-immune status 01/11/2012  . Mass of neck 03/02/2014  . Vaginal Pap smear, abnormal   . Diabetes mellitus without complication [facility])     gestational    Past Surgical History  Procedure Laterality Date  . No past surgeries      Social History   Social History  . Marital Status: Single    Spouse Name: N/A  . Number of Children: N/A  . Years of Education: N/A   Social History Main Topics  .  Smoking status: Never Smoker   . Smokeless tobacco: Never Used  . Alcohol Use: Yes     Comment: occasional  . Drug Use: No  . Sexual Activity: Yes    Birth Control/ Protection: Condom   Other Topics Concern  . None   Social History Narrative    Review of Systems  Constitutional: Negative for fever, chills, appetite change and fatigue.  HENT: Positive for congestion. Negative for ear pain, sinus pressure, sneezing and sore throat.   Eyes: Negative for visual disturbance.  Respiratory: Positive for cough and shortness of breath. Negative for wheezing.   Cardiovascular: Negative for chest pain, palpitations and leg swelling.  Gastrointestinal: Positive for nausea (mild nausea today) and abdominal pain (upper abdominal pain - ? ventral hernia). Negative for vomiting, diarrhea and constipation.  Endocrine: Positive for polydipsia and polyuria.  Genitourinary: Negative for dysuria and hematuria.  Musculoskeletal: Negative for back pain and arthralgias.  Neurological: Negative for dizziness, light-headedness and headaches.  Psychiatric/Behavioral: Negative for dysphoric mood. The patient is not nervous/anxious.        Objective:   Filed Vitals:   06/20/15 1012  BP: 114/76  Pulse: 76  Temp: 98.2 F (36.8 C)  Resp: 16   Filed  Weights   06/20/15 1012  Weight: 219 lb (99.338 kg)   Body mass index is 38.8 kg/(m^2).   Physical Exam  Constitutional: She appears well-developed and well-nourished. No distress.  HENT:  Head: Normocephalic and atraumatic.  Right Ear: External ear normal.  Left Ear: External ear normal.  Mouth/Throat: Oropharynx is clear and moist.  Normal b/l ear canals and TM  Eyes: Conjunctivae are normal.  Neck: Neck supple. No tracheal deviation present. No thyromegaly present.  Cardiovascular: Normal rate, regular rhythm and normal heart sounds.   No murmur heard. Pulmonary/Chest: Effort normal and breath sounds normal. No respiratory distress. She has no  wheezes. She has no rales.  Abdominal: Soft. There is no tenderness.  Obese, no obvious ventral hernia on exam  Musculoskeletal: She exhibits no edema.  Lymphadenopathy:    She has no cervical adenopathy.  Neurological: She is alert.  Skin: Skin is warm and dry.  Psychiatric: She has a normal mood and affect. Her behavior is normal.        Assessment & Plan:   Physical exam:  Having blood work done through ob/gyn - no need for screening blood work Stressed regular exercise and to limit weight gain during pregnancy ( for possible ventral hernia and because she is already overweight) Low sugar / carb diet given history of gestational diabetes with her first pregnancy Will start prenatal vitamins tomorrow - has an Ob appt Deferred flu shot - will discuss with ob tomorrow tdap up to date No further testing needed  Sob, wheeze, cough Possible related to asthma/ allergies vs some symptoms related to pregnancy Her symptoms seem mild I have given her a script for an albuterol inhaler - she will discuss with her OB tomorrow and only take it needed  Follow up as needed

## 2015-06-20 NOTE — Progress Notes (Signed)
Pre visit review using our clinic review tool, if applicable. No additional management support is needed unless otherwise documented below in the visit note. 

## 2015-07-11 LAB — OB RESULTS CONSOLE GC/CHLAMYDIA: Gonorrhea: NEGATIVE

## 2015-07-22 LAB — OB RESULTS CONSOLE RPR: RPR: NONREACTIVE

## 2015-08-11 LAB — HM PAP SMEAR

## 2015-08-24 LAB — OB RESULTS CONSOLE GBS: STREP GROUP B AG: NEGATIVE

## 2015-08-31 DIAGNOSIS — E669 Obesity, unspecified: Secondary | ICD-10-CM | POA: Insufficient documentation

## 2015-11-21 LAB — OB RESULTS CONSOLE HIV ANTIBODY (ROUTINE TESTING): HIV: NONREACTIVE

## 2015-11-21 LAB — OB RESULTS CONSOLE RPR: RPR: NONREACTIVE

## 2016-01-27 ENCOUNTER — Other Ambulatory Visit: Payer: Self-pay | Admitting: Obstetrics & Gynecology

## 2016-01-27 ENCOUNTER — Encounter (HOSPITAL_COMMUNITY): Payer: Self-pay | Admitting: General Practice

## 2016-01-27 ENCOUNTER — Telehealth (HOSPITAL_COMMUNITY): Payer: Self-pay | Admitting: General Practice

## 2016-01-31 ENCOUNTER — Inpatient Hospital Stay (HOSPITAL_COMMUNITY): Admission: RE | Admit: 2016-01-31 | Payer: BLUE CROSS/BLUE SHIELD | Source: Ambulatory Visit

## 2016-02-28 ENCOUNTER — Encounter (HOSPITAL_COMMUNITY): Payer: Self-pay

## 2016-02-28 ENCOUNTER — Encounter (HOSPITAL_COMMUNITY)
Admission: RE | Disposition: A | Discharge status: Home / Self Care | Payer: Self-pay | Source: Ambulatory Visit | Attending: Obstetrics and Gynecology

## 2016-02-28 ENCOUNTER — Inpatient Hospital Stay (HOSPITAL_COMMUNITY)
Admission: RE | Admit: 2016-02-28 | Discharge: 2016-02-28 | Disposition: A | Discharge status: Home / Self Care | Payer: Medicaid Other | Source: Ambulatory Visit | Attending: Obstetrics and Gynecology | Admitting: Obstetrics and Gynecology

## 2016-02-28 ENCOUNTER — Inpatient Hospital Stay (HOSPITAL_COMMUNITY)
Admission: RE | Admit: 2016-02-28 | Discharge: 2016-03-01 | DRG: 765 | Disposition: A | Discharge status: Home / Self Care | Payer: Medicaid Other | Source: Ambulatory Visit | Attending: Obstetrics and Gynecology | Admitting: Obstetrics and Gynecology

## 2016-02-28 ENCOUNTER — Inpatient Hospital Stay (HOSPITAL_COMMUNITY): Payer: Medicaid Other | Admitting: Anesthesiology

## 2016-02-28 DIAGNOSIS — O9902 Anemia complicating childbirth: Secondary | ICD-10-CM | POA: Diagnosis present

## 2016-02-28 DIAGNOSIS — O99214 Obesity complicating childbirth: Secondary | ICD-10-CM | POA: Diagnosis present

## 2016-02-28 DIAGNOSIS — Z811 Family history of alcohol abuse and dependence: Secondary | ICD-10-CM | POA: Diagnosis not present

## 2016-02-28 DIAGNOSIS — O480 Post-term pregnancy: Secondary | ICD-10-CM | POA: Diagnosis present

## 2016-02-28 DIAGNOSIS — O3413 Maternal care for benign tumor of corpus uteri, third trimester: Secondary | ICD-10-CM

## 2016-02-28 DIAGNOSIS — D649 Anemia, unspecified: Secondary | ICD-10-CM | POA: Diagnosis present

## 2016-02-28 DIAGNOSIS — D259 Leiomyoma of uterus, unspecified: Secondary | ICD-10-CM

## 2016-02-28 DIAGNOSIS — Z825 Family history of asthma and other chronic lower respiratory diseases: Secondary | ICD-10-CM

## 2016-02-28 DIAGNOSIS — Z6841 Body Mass Index (BMI) 40.0 and over, adult: Secondary | ICD-10-CM

## 2016-02-28 DIAGNOSIS — O99824 Streptococcus B carrier state complicating childbirth: Secondary | ICD-10-CM | POA: Diagnosis present

## 2016-02-28 DIAGNOSIS — Z8632 Personal history of gestational diabetes: Secondary | ICD-10-CM

## 2016-02-28 DIAGNOSIS — O3663X0 Maternal care for excessive fetal growth, third trimester, not applicable or unspecified: Secondary | ICD-10-CM | POA: Diagnosis present

## 2016-02-28 DIAGNOSIS — Z3A41 41 weeks gestation of pregnancy: Secondary | ICD-10-CM

## 2016-02-28 DIAGNOSIS — Z8249 Family history of ischemic heart disease and other diseases of the circulatory system: Secondary | ICD-10-CM

## 2016-02-28 DIAGNOSIS — Z98891 History of uterine scar from previous surgery: Secondary | ICD-10-CM

## 2016-02-28 DIAGNOSIS — E669 Obesity, unspecified: Secondary | ICD-10-CM

## 2016-02-28 LAB — CBC
HCT: 39.8 % (ref 36.0–46.0)
Hemoglobin: 13.5 g/dL (ref 12.0–15.0)
MCH: 27.8 pg (ref 26.0–34.0)
MCHC: 33.9 g/dL (ref 30.0–36.0)
MCV: 82.1 fL (ref 78.0–100.0)
PLATELETS: 178 10*3/uL (ref 150–400)
RBC: 4.85 MIL/uL (ref 3.87–5.11)
RDW: 16 % — ABNORMAL HIGH (ref 11.5–15.5)
WBC: 7 10*3/uL (ref 4.0–10.5)

## 2016-02-28 LAB — ABO/RH: ABO/RH(D): B POS

## 2016-02-28 LAB — TYPE AND SCREEN
ABO/RH(D): B POS
Antibody Screen: NEGATIVE

## 2016-02-28 SURGERY — Surgical Case
Anesthesia: Spinal

## 2016-02-28 SURGERY — Surgical Case
Anesthesia: *Unknown

## 2016-02-28 MED ORDER — LACTATED RINGERS IV SOLN
INTRAVENOUS | Status: DC
  Administered 2016-02-28 (×3): via INTRAVENOUS

## 2016-02-28 MED ORDER — EPHEDRINE SULFATE 50 MG/ML IJ SOLN
INTRAMUSCULAR | Status: DC | PRN
  Administered 2016-02-28: 20 mg via INTRAVENOUS

## 2016-02-28 MED ORDER — SCOPOLAMINE 1 MG/3DAYS TD PT72
1.0000 | MEDICATED_PATCH | Freq: Once | TRANSDERMAL | Status: DC
  Administered 2016-02-28: 1.5 mg via TRANSDERMAL

## 2016-02-28 MED ORDER — CEFAZOLIN SODIUM-DEXTROSE 2-3 GM-% IV SOLR
INTRAVENOUS | Status: DC | PRN
  Administered 2016-02-28: 2 g via INTRAVENOUS

## 2016-02-28 MED ORDER — NALBUPHINE HCL 10 MG/ML IJ SOLN: 5.0000 mg | INTRAMUSCULAR | Status: DC | PRN

## 2016-02-28 MED ORDER — OXYTOCIN 40 UNITS IN LACTATED RINGERS INFUSION - SIMPLE MED: 2.5000 [IU]/h | INTRAVENOUS | Status: AC

## 2016-02-28 MED ORDER — KETOROLAC TROMETHAMINE 30 MG/ML IJ SOLN
30.0000 mg | Freq: Four times a day (QID) | INTRAMUSCULAR | Status: AC | PRN
  Administered 2016-02-28: 30 mg via INTRAVENOUS
  Filled 2016-02-28: qty 1

## 2016-02-28 MED ORDER — DEXAMETHASONE SODIUM PHOSPHATE 4 MG/ML IJ SOLN
INTRAMUSCULAR | Status: AC
  Filled 2016-02-28: qty 1

## 2016-02-28 MED ORDER — ONDANSETRON HCL 4 MG/2ML IJ SOLN
INTRAMUSCULAR | Status: AC
Start: 2016-02-28 — End: 2016-02-28
  Filled 2016-02-28: qty 2

## 2016-02-28 MED ORDER — SENNOSIDES-DOCUSATE SODIUM 8.6-50 MG PO TABS
2.0000 | ORAL_TABLET | ORAL | Status: DC
  Administered 2016-02-29: 2 via ORAL
  Filled 2016-02-28: qty 2

## 2016-02-28 MED ORDER — IBUPROFEN 600 MG PO TABS
600.0000 mg | ORAL_TABLET | Freq: Four times a day (QID) | ORAL | Status: DC
  Administered 2016-02-29 – 2016-03-01 (×6): 600 mg via ORAL
  Filled 2016-02-28 (×6): qty 1

## 2016-02-28 MED ORDER — ZOLPIDEM TARTRATE 5 MG PO TABS: 5.0000 mg | ORAL_TABLET | Freq: Every evening | ORAL | Status: DC | PRN

## 2016-02-28 MED ORDER — WITCH HAZEL-GLYCERIN EX PADS: 1.0000 "application " | MEDICATED_PAD | CUTANEOUS | Status: DC | PRN

## 2016-02-28 MED ORDER — KETOROLAC TROMETHAMINE 30 MG/ML IJ SOLN
30.0000 mg | Freq: Four times a day (QID) | INTRAMUSCULAR | Status: AC | PRN
  Administered 2016-02-28: 30 mg via INTRAMUSCULAR

## 2016-02-28 MED ORDER — NALOXONE HCL 2 MG/2ML IJ SOSY
1.0000 ug/kg/h | PREFILLED_SYRINGE | INTRAVENOUS | Status: DC | PRN
  Filled 2016-02-28: qty 2

## 2016-02-28 MED ORDER — BUPIVACAINE IN DEXTROSE 0.75-8.25 % IT SOLN
INTRATHECAL | Status: AC
  Filled 2016-02-28: qty 2

## 2016-02-28 MED ORDER — LACTATED RINGERS IV SOLN
INTRAVENOUS | Status: DC | PRN
  Administered 2016-02-28: 14:00:00 via INTRAVENOUS

## 2016-02-28 MED ORDER — MORPHINE SULFATE-NACL 0.5-0.9 MG/ML-% IV SOSY
PREFILLED_SYRINGE | INTRAVENOUS | Status: AC
  Filled 2016-02-28: qty 1

## 2016-02-28 MED ORDER — MORPHINE SULFATE (PF) 0.5 MG/ML IJ SOLN
INTRAMUSCULAR | Status: DC | PRN
  Administered 2016-02-28: .2 mg via INTRATHECAL

## 2016-02-28 MED ORDER — ONDANSETRON HCL 4 MG/2ML IJ SOLN: 4.0000 mg | Freq: Three times a day (TID) | INTRAMUSCULAR | Status: DC | PRN

## 2016-02-28 MED ORDER — BUPIVACAINE IN DEXTROSE 0.75-8.25 % IT SOLN
INTRATHECAL | Status: DC | PRN
  Administered 2016-02-28: 1.6 mL via INTRATHECAL

## 2016-02-28 MED ORDER — MEPERIDINE HCL 25 MG/ML IJ SOLN: 6.2500 mg | INTRAMUSCULAR | Status: DC | PRN

## 2016-02-28 MED ORDER — SIMETHICONE 80 MG PO CHEW
80.0000 mg | CHEWABLE_TABLET | ORAL | Status: DC
  Administered 2016-02-29: 80 mg via ORAL
  Filled 2016-02-28: qty 1

## 2016-02-28 MED ORDER — NALBUPHINE HCL 10 MG/ML IJ SOLN: 5.0000 mg | Freq: Once | INTRAMUSCULAR | Status: DC | PRN

## 2016-02-28 MED ORDER — DIPHENHYDRAMINE HCL 25 MG PO CAPS
25.0000 mg | ORAL_CAPSULE | ORAL | Status: DC | PRN
  Filled 2016-02-28: qty 1

## 2016-02-28 MED ORDER — SIMETHICONE 80 MG PO CHEW
80.0000 mg | CHEWABLE_TABLET | Freq: Three times a day (TID) | ORAL | Status: DC
  Administered 2016-02-28 – 2016-03-01 (×6): 80 mg via ORAL
  Filled 2016-02-28 (×6): qty 1

## 2016-02-28 MED ORDER — SODIUM CHLORIDE 0.9% FLUSH: 3.0000 mL | INTRAVENOUS | Status: DC | PRN

## 2016-02-28 MED ORDER — EPHEDRINE 5 MG/ML INJ
INTRAVENOUS | Status: AC
  Filled 2016-02-28: qty 10

## 2016-02-28 MED ORDER — FENTANYL CITRATE (PF) 100 MCG/2ML IJ SOLN
INTRAMUSCULAR | Status: DC | PRN
  Administered 2016-02-28: 10 ug via INTRATHECAL

## 2016-02-28 MED ORDER — OXYCODONE-ACETAMINOPHEN 5-325 MG PO TABS: 2.0000 | ORAL_TABLET | ORAL | Status: DC | PRN

## 2016-02-28 MED ORDER — OXYTOCIN 10 UNIT/ML IJ SOLN
40.0000 [IU] | INTRAVENOUS | Status: DC | PRN
  Administered 2016-02-28: 40 [IU] via INTRAVENOUS

## 2016-02-28 MED ORDER — PHENYLEPHRINE 8 MG IN D5W 100 ML (0.08MG/ML) PREMIX OPTIME
INJECTION | INTRAVENOUS | Status: AC
Start: 2016-02-28 — End: 2016-02-28
  Filled 2016-02-28: qty 100

## 2016-02-28 MED ORDER — SCOPOLAMINE 1 MG/3DAYS TD PT72: 1.0000 | MEDICATED_PATCH | Freq: Once | TRANSDERMAL | Status: DC

## 2016-02-28 MED ORDER — FENTANYL CITRATE (PF) 100 MCG/2ML IJ SOLN
INTRAMUSCULAR | Status: AC
  Filled 2016-02-28: qty 2

## 2016-02-28 MED ORDER — KETOROLAC TROMETHAMINE 30 MG/ML IJ SOLN
INTRAMUSCULAR | Status: AC
  Filled 2016-02-28: qty 1

## 2016-02-28 MED ORDER — SCOPOLAMINE 1 MG/3DAYS TD PT72
MEDICATED_PATCH | TRANSDERMAL | Status: AC
  Administered 2016-02-28: 1.5 mg via TRANSDERMAL
  Filled 2016-02-28: qty 1

## 2016-02-28 MED ORDER — FENTANYL CITRATE (PF) 100 MCG/2ML IJ SOLN: 25.0000 ug | INTRAMUSCULAR | Status: DC | PRN

## 2016-02-28 MED ORDER — LACTATED RINGERS IV SOLN: INTRAVENOUS | Status: DC

## 2016-02-28 MED ORDER — PRENATAL MULTIVITAMIN CH
1.0000 | ORAL_TABLET | Freq: Every day | ORAL | Status: DC
  Administered 2016-02-29 – 2016-03-01 (×2): 1 via ORAL
  Filled 2016-02-28 (×2): qty 1

## 2016-02-28 MED ORDER — DIPHENHYDRAMINE HCL 25 MG PO CAPS: 25.0000 mg | ORAL_CAPSULE | Freq: Four times a day (QID) | ORAL | Status: DC | PRN

## 2016-02-28 MED ORDER — COCONUT OIL OIL: 1.0000 "application " | TOPICAL_OIL | Status: DC | PRN

## 2016-02-28 MED ORDER — ACETAMINOPHEN 325 MG PO TABS
650.0000 mg | ORAL_TABLET | ORAL | Status: DC | PRN
  Administered 2016-02-28: 650 mg via ORAL
  Filled 2016-02-28: qty 2

## 2016-02-28 MED ORDER — LACTATED RINGERS IV SOLN
Freq: Once | INTRAVENOUS | Status: AC
  Administered 2016-02-28: 11:00:00 via INTRAVENOUS

## 2016-02-28 MED ORDER — PHENYLEPHRINE 8 MG IN D5W 100 ML (0.08MG/ML) PREMIX OPTIME
INJECTION | INTRAVENOUS | Status: DC | PRN
  Administered 2016-02-28: 60 ug/min via INTRAVENOUS

## 2016-02-28 MED ORDER — TETANUS-DIPHTH-ACELL PERTUSSIS 5-2.5-18.5 LF-MCG/0.5 IM SUSP: 0.5000 mL | Freq: Once | INTRAMUSCULAR | Status: DC

## 2016-02-28 MED ORDER — OXYTOCIN 10 UNIT/ML IJ SOLN
INTRAMUSCULAR | Status: AC
  Filled 2016-02-28: qty 4

## 2016-02-28 MED ORDER — DIBUCAINE 1 % RE OINT
1.0000 | TOPICAL_OINTMENT | RECTAL | Status: DC | PRN
Start: 2016-02-28 — End: 2016-03-01

## 2016-02-28 MED ORDER — ALBUTEROL SULFATE (2.5 MG/3ML) 0.083% IN NEBU: 3.0000 mL | INHALATION_SOLUTION | Freq: Four times a day (QID) | RESPIRATORY_TRACT | Status: DC | PRN

## 2016-02-28 MED ORDER — DEXAMETHASONE SODIUM PHOSPHATE 4 MG/ML IJ SOLN
INTRAMUSCULAR | Status: DC | PRN
  Administered 2016-02-28: 4 mg via INTRAVENOUS

## 2016-02-28 MED ORDER — SODIUM CHLORIDE 0.9 % IR SOLN
Status: DC | PRN
  Administered 2016-02-28: 1
  Administered 2016-02-28: 1000 mL

## 2016-02-28 MED ORDER — NALOXONE HCL 0.4 MG/ML IJ SOLN: 0.4000 mg | INTRAMUSCULAR | Status: DC | PRN

## 2016-02-28 MED ORDER — MENTHOL 3 MG MT LOZG: 1.0000 | LOZENGE | OROMUCOSAL | Status: DC | PRN

## 2016-02-28 MED ORDER — DIPHENHYDRAMINE HCL 50 MG/ML IJ SOLN: 12.5000 mg | INTRAMUSCULAR | Status: DC | PRN

## 2016-02-28 MED ORDER — SIMETHICONE 80 MG PO CHEW: 80.0000 mg | CHEWABLE_TABLET | ORAL | Status: DC | PRN

## 2016-02-28 MED ORDER — PROMETHAZINE HCL 25 MG/ML IJ SOLN: 6.2500 mg | INTRAMUSCULAR | Status: DC | PRN

## 2016-02-28 MED ORDER — OXYCODONE-ACETAMINOPHEN 5-325 MG PO TABS
1.0000 | ORAL_TABLET | ORAL | Status: DC | PRN
Start: 2016-02-28 — End: 2016-03-01

## 2016-02-28 MED ORDER — ONDANSETRON HCL 4 MG/2ML IJ SOLN
INTRAMUSCULAR | Status: DC | PRN
  Administered 2016-02-28: 4 mg via INTRAVENOUS

## 2016-02-28 SURGICAL SUPPLY — 40 items
APL SKNCLS STERI-STRIP NONHPOA (GAUZE/BANDAGES/DRESSINGS) ×1
BARRIER ADHS 3X4 INTERCEED (GAUZE/BANDAGES/DRESSINGS) ×1 IMPLANT
BENZOIN TINCTURE PRP APPL 2/3 (GAUZE/BANDAGES/DRESSINGS) ×2 IMPLANT
BRR ADH 4X3 ABS CNTRL BYND (GAUZE/BANDAGES/DRESSINGS) ×1
CHLORAPREP W/TINT 26ML (MISCELLANEOUS) ×2 IMPLANT
CLAMP CORD UMBIL (MISCELLANEOUS) IMPLANT
CLOSURE STERI STRIP 1/2 X4 (GAUZE/BANDAGES/DRESSINGS) ×1 IMPLANT
CLOTH BEACON ORANGE TIMEOUT ST (SAFETY) ×2 IMPLANT
CONTAINER PREFILL 10% NBF 15ML (MISCELLANEOUS) IMPLANT
DRSG OPSITE POSTOP 4X10 (GAUZE/BANDAGES/DRESSINGS) ×2 IMPLANT
ELECT REM PT RETURN 9FT ADLT (ELECTROSURGICAL) ×2
ELECTRODE REM PT RTRN 9FT ADLT (ELECTROSURGICAL) ×1 IMPLANT
EXTRACTOR VACUUM M CUP 4 TUBE (SUCTIONS) IMPLANT
GLOVE BIO SURGEON STRL SZ7.5 (GLOVE) ×2 IMPLANT
GLOVE BIOGEL PI IND STRL 7.0 (GLOVE) ×1 IMPLANT
GLOVE BIOGEL PI IND STRL 7.5 (GLOVE) ×1 IMPLANT
GLOVE BIOGEL PI INDICATOR 7.0 (GLOVE) ×1
GLOVE BIOGEL PI INDICATOR 7.5 (GLOVE) ×1
GOWN STRL REUS W/TWL LRG LVL3 (GOWN DISPOSABLE) ×4 IMPLANT
KIT ABG SYR 3ML LUER SLIP (SYRINGE) IMPLANT
NDL HYPO 25X5/8 SAFETYGLIDE (NEEDLE) IMPLANT
NEEDLE HYPO 25X5/8 SAFETYGLIDE (NEEDLE) IMPLANT
NS IRRIG 1000ML POUR BTL (IV SOLUTION) ×2 IMPLANT
PACK C SECTION WH (CUSTOM PROCEDURE TRAY) ×2 IMPLANT
PAD OB MATERNITY 4.3X12.25 (PERSONAL CARE ITEMS) ×2 IMPLANT
PENCIL BUTTON HOLSTER BLD 10FT (ELECTRODE) ×1 IMPLANT
PENCIL SMOKE EVAC W/HOLSTER (ELECTROSURGICAL) ×2 IMPLANT
RTRCTR C-SECT PINK 25CM LRG (MISCELLANEOUS) ×2 IMPLANT
SPONGE LAP 18X18 X RAY DECT (DISPOSABLE) ×3 IMPLANT
STRIP CLOSURE SKIN 1/2X4 (GAUZE/BANDAGES/DRESSINGS) ×2 IMPLANT
SUT CHROMIC 2 0 CT 1 (SUTURE) ×2 IMPLANT
SUT MNCRL AB 3-0 PS2 27 (SUTURE) ×2 IMPLANT
SUT PLAIN 2 0 XLH (SUTURE) ×2 IMPLANT
SUT VIC AB 0 CT1 36 (SUTURE) ×3 IMPLANT
SUT VIC AB 0 CTX 36 (SUTURE) ×8
SUT VIC AB 0 CTX36XBRD ANBCTRL (SUTURE) ×3 IMPLANT
SUT VIC AB 2-0 SH 27 (SUTURE) ×4
SUT VIC AB 2-0 SH 27XBRD (SUTURE) ×2 IMPLANT
TOWEL OR 17X24 6PK STRL BLUE (TOWEL DISPOSABLE) ×2 IMPLANT
TRAY FOLEY CATH SILVER 14FR (SET/KITS/TRAYS/PACK) ×2 IMPLANT

## 2016-02-28 NOTE — Op Note (Addendum)
Cesarean Section Procedure Note  Indications: P1 at 52 1/7wks with macrosomia desiring c-section  Pre-operative Diagnosis: macrosomia   Post-operative Diagnosis: macrosomia  Procedure: PRIMARY LOW TRANSVERSE CESAREAN SECTION  Surgeon: [name], MD    Assistants: Claretta Fraise, RNFA  Anesthesia: Regional  Anesthesiologist: Effie Berkshire, MD   Procedure Details  The patient was taken to the operating room secondary to macrosomia after the risks, benefits, complications, treatment options, and expected outcomes were discussed with the patient.  The patient concurred with the proposed plan, giving informed consent which was signed and witnessed. The patient was taken to Operating Room C-Section Suite, identified as Brenda [name] and the procedure verified as C-Section Delivery. A Time Out was held and the above information confirmed.  After induction of anesthesia by obtaining a spinal, the patient was prepped and draped in the usual sterile manner. A Pfannenstiel skin incision was made and carried down through the subcutaneous tissue to the underlying layer of fascia.  The fascia was incised bilaterally and extended transversely bilaterally with the Mayo scissors. Kocher clamps were placed on the inferior aspect of the fascial incision and the underlying rectus muscle was separated from the fascia. The same was done on the superior aspect of the fascial incision.  The peritoneum was identified, entered bluntly and extended manually.  An Alexis self-retaining retractor was placed.  The utero-vesical peritoneal reflection was incised transversely and the bladder flap was bluntly freed from the lower uterine segment. A low transverse uterine incision was made with the scalpel and extended bilaterally with the bandage scissors.  The infant was delivered in vertex position without difficulty.  After the umbilical cord was clamped and cut, the infant was handed to the awaiting  pediatricians.  Cord blood was obtained for evaluation.  The placenta was removed intact and appeared to be within normal limits. The uterus was cleared of all clots and debris. The uterine incision was closed with running interlocking sutures of 0 Vicryl and a second imbricating layer was performed as well.   Several figure of eight stitches placed for hemostasis.  Interceed applied to incision.  Bilateral tubes and ovaries appeared to be within normal limits.  Good hemostasis was noted.  Copious irrigation was performed until clear.  The peritoneum was repaired with 2-0 chromic via a running suture.  The fascia was reapproximated with a running suture of 0 Vicryl. The subcutaneous tissue was reapproximated with 3 interrupted sutures of 2-0 plain.  The skin was reapproximated with a subcuticular suture of 3-0 monocryl.  Steristrips were applied.  Instrument, sponge, and needle counts were correct prior to abdominal closure and at the conclusion of the case.  The patient was awaiting transfer to the recovery room in good condition.  Findings: Live female infant with Apgars 8 at one minute and 9 at five minutes.  Normal appearing bilateral ovaries and fallopian tubes were noted.  Estimated Blood Loss:  650 ml         Drains: foley to gravity 500 cc         Total IV Fluids: 3200 ml         Specimens to Pathology: Placenta         Complications:  None; patient tolerated the procedure well.         Disposition: PACU - hemodynamically stable.         Condition: stable  Attending Attestation: I performed the procedure.

## 2016-02-28 NOTE — Anesthesia Procedure Notes (Signed)
Spinal Patient location during procedure: OR Start time: 02/28/2016 12:51 PM End time: 02/28/2016 12:53 PM Staffing Anesthesiologist: Suella Broad D Performed by: anesthesiologist  Preanesthetic Checklist Completed: patient identified, site marked, surgical consent, pre-op evaluation, timeout performed, IV checked, risks and benefits discussed and monitors and equipment checked Spinal Block Patient position: sitting Prep: Betadine Patient monitoring: heart rate, continuous pulse ox, blood pressure and cardiac monitor Approach: midline Location: L4-5 Injection technique: single-shot Needle Needle type: Whitacre and Introducer  Needle gauge: 24 G Needle length: 9 cm Additional Notes Negative paresthesia. Negative blood return. Positive free-flowing CSF. Expiration date of kit checked and confirmed. Patient tolerated procedure well, without complications.

## 2016-02-28 NOTE — Anesthesia Postprocedure Evaluation (Signed)
Anesthesia Post Note  Patient: Brenda [name]  Procedure(s) Performed: Procedure(s) (LRB): CESAREAN SECTION (N/A)  Patient location during evaluation: PACU Anesthesia Type: Spinal Level of consciousness: oriented and awake and alert Pain management: pain level controlled Vital Signs Assessment: post-procedure vital signs reviewed and stable Respiratory status: spontaneous breathing, respiratory function stable and patient connected to nasal cannula oxygen Cardiovascular status: blood pressure returned to baseline and stable Postop Assessment: no headache, no backache and spinal receding Anesthetic complications: no     Last Vitals:  Filed Vitals:   02/28/16 1506 02/28/16 1511  BP:    Pulse:  105  Temp:    Resp: 20 18    Last Pain:  Filed Vitals:   02/28/16 1512  PainSc: 0-No pain   Pain Goal: Patients Stated Pain Goal: 3 (02/28/16 1029)               Effie Berkshire

## 2016-02-28 NOTE — Progress Notes (Signed)
Estill Bamberg Rn spoke with Dr. Mancel Bale regarding pt understanding of having a primary c/s vs an IOL this am. Pt states that baby is moving well, denies LOF, bleeding. Reports last ate at [place]. C/S scheduled today at 1230. Pt to come back in at 10 am through peri op for c/s. Pt given labor precautions. Verbalizes understanding.

## 2016-02-28 NOTE — Addendum Note (Signed)
Addendum  created 02/28/16 2116 by Jonna Munro, CRNA   Modules edited: Clinical Notes   Clinical Notes:  File: [id number]

## 2016-02-28 NOTE — Transfer of Care (Signed)
Immediate Anesthesia Transfer of Care Note  Patient: Brenda [name]  Procedure(s) Performed: Procedure(s): CESAREAN SECTION (N/A)  Patient Location: PACU  Anesthesia Type:Spinal  Level of Consciousness: awake, alert  and oriented  Airway & Oxygen Therapy: Patient Spontanous Breathing  Post-op Assessment: Report given to RN and Post -op Vital signs reviewed and stable  Post vital signs: Reviewed and stable  Last Vitals:  Filed Vitals:   02/28/16 1014  BP: 145/84  Pulse: 107  Temp: 36.7 C  Resp: 18    Last Pain: There were no vitals filed for this visit.    Patients Stated Pain Goal: 3 ([id number])  Complications: No apparent anesthesia complications

## 2016-02-28 NOTE — Anesthesia Preprocedure Evaluation (Addendum)
Anesthesia Evaluation  Patient identified by MRN, date of birth, ID band Patient awake    Reviewed: Allergy & Precautions, NPO status , Patient's Chart, lab work & pertinent test results  Airway Mallampati: III       Dental  (+) Teeth Intact   Pulmonary asthma ,    breath sounds clear to auscultation       Cardiovascular negative cardio ROS   Rhythm:Regular Rate:Normal     Neuro/Psych negative neurological ROS  negative psych ROS   GI/Hepatic negative GI ROS, Neg liver ROS,   Endo/Other  diabetes, Gestational  Renal/GU negative Renal ROS  negative genitourinary   Musculoskeletal negative musculoskeletal ROS (+)   Abdominal   Peds negative pediatric ROS (+)  Hematology negative hematology ROS (+)   Anesthesia Other Findings   Reproductive/Obstetrics (+) Pregnancy                            Lab Results  Component Value Date   WBC 4.5 06/16/2015   HGB 13.0 06/16/2015   HCT 38.3 06/16/2015   MCV 82.2 06/16/2015   PLT 243 06/16/2015   No results found for: INR, PROTIME   Anesthesia Physical Anesthesia Plan  ASA: III  Anesthesia Plan: Spinal   Post-op Pain Management:    Induction:   Airway Management Planned: Natural Airway  Additional Equipment:   Intra-op Plan:   Post-operative Plan:   Informed Consent: I have reviewed the patients History and Physical, chart, labs and discussed the procedure including the risks, benefits and alternatives for the proposed anesthesia with the patient or authorized representative who has indicated his/her understanding and acceptance.     Plan Discussed with: CRNA  Anesthesia Plan Comments:         Anesthesia Quick Evaluation

## 2016-02-28 NOTE — H&P (Signed)
Brenda [name] is a 30 y.o. female presenting for c-section after considering discussion with AVS yesterday.  She initially presented this morning for scheduled induction but decided to have c-section instead secondary to macrosomia.  Denies VB, ctxs or LOF and reports good FM.  History OB History    Gravida Para Term Preterm AB TAB SAB Ectopic Multiple Living   2 1 1  0 0 0 0 0 0 1     Past Medical History  Diagnosis Date  . H/O chlamydia infection 2010  . H/O varicella   . Abnormal Pap smear 2007    colpo  . Obese   . Fibroid   . Rubella non-immune status 01/11/2012  . Mass of neck 03/02/2014  . Vaginal Pap smear, abnormal   . Gestational diabetes     first pregnancy, diet controlled  . Asthma     childhood   Past Surgical History  Procedure Laterality Date  . No past surgeries     Family History: family history includes Alcohol abuse in her father; Asthma in her cousin; Cancer in her cousin; Heart disease in her cousin, maternal aunt, maternal grandmother, and paternal grandmother; Hypertension in her father, maternal grandmother, mother, and paternal grandmother; Learning disabilities in her cousin; Mental illness in her maternal aunt; Thrombophlebitis in her maternal grandmother. There is no history of Anesthesia problems. Social History:  reports that she has never smoked. She has never used smokeless tobacco. She reports that she drinks alcohol. She reports that she does not use illicit drugs.   Prenatal Transfer Tool  Maternal Diabetes: No Genetic Screening: Normal Maternal Ultrasounds/Referrals: Normal with multiple fibroids Fetal Ultrasounds or other Referrals:  None Maternal Substance Abuse:  No Significant Maternal Medications:  Meds include: Other: valtrex Significant Maternal Lab Results:  Lab values include: Group B Strep positive Other Comments:  None  ROS Non-contributory   Last menstrual period 05/16/2015. Exam Physical Exam  Lungs CTA  CV RRR Abd  soft, NT, gravide Ext no calf tenderness  Prenatal labs: ABO, Rh: --/--/B POS (07/21 1020) Antibody: NEG (07/21 1020) Rubella:  Immune RPR: Nonreactive (04/13 0000)  HBsAg:   neg HIV: Non-reactive (04/13 0000)  GBS: Negative (01/14 0000)   EFW on u/s yesterday 4079g/9lbs, BPP 8/8, vtx, nl fluid, post placenta  Assessment/Plan: P1 at 44 1/7wks with macrosomia and wanting a c-section secondary to risks associated with vaginal delivery.  R/B/A discussed.  Consent s/w.  Questions answered.   Delice Lesch 02/28/2016, 12:11 PM

## 2016-02-28 NOTE — Anesthesia Postprocedure Evaluation (Signed)
Anesthesia Post Note  Patient: AKESHIA MINCEY  Procedure(s) Performed: Procedure(s) (LRB): CESAREAN SECTION (N/A)  Patient location during evaluation: Mother Baby Anesthesia Type: Spinal Level of consciousness: awake and alert and oriented Pain management: satisfactory to patient Vital Signs Assessment: post-procedure vital signs reviewed and stable Respiratory status: spontaneous breathing and nonlabored ventilation Cardiovascular status: stable Postop Assessment: no headache, no backache, patient able to bend at knees, no signs of nausea or vomiting and adequate PO intake Anesthetic complications: no     Last Vitals:  Filed Vitals:   02/28/16 1815 02/28/16 1915  BP: 128/64 130/54  Pulse: 96 95  Temp: 37 C 37.1 C  Resp: 18 18    Last Pain:  Filed Vitals:   02/28/16 1947  PainSc: 2    Pain Goal: Patients Stated Pain Goal: 3 (02/28/16 1815)               Willa Rough

## 2016-02-29 ENCOUNTER — Encounter (HOSPITAL_COMMUNITY): Payer: Self-pay | Admitting: Obstetrics and Gynecology

## 2016-02-29 LAB — CBC
HEMATOCRIT: 31.5 % — AB (ref 36.0–46.0)
HEMOGLOBIN: 10.5 g/dL — AB (ref 12.0–15.0)
MCH: 27.2 pg (ref 26.0–34.0)
MCHC: 33.3 g/dL (ref 30.0–36.0)
MCV: 81.6 fL (ref 78.0–100.0)
Platelets: 164 10*3/uL (ref 150–400)
RBC: 3.86 MIL/uL — ABNORMAL LOW (ref 3.87–5.11)
RDW: 16.1 % — AB (ref 11.5–15.5)
WBC: 11.9 10*3/uL — ABNORMAL HIGH (ref 4.0–10.5)

## 2016-02-29 LAB — RPR: RPR Ser Ql: NONREACTIVE

## 2016-02-29 NOTE — Progress Notes (Signed)
Postpartum Note Day # 1  S:  Patient resting comfortable in bed.  Pain controlled.  Tolerating general diet. + flatus, no BM.  Lochia minimal.  Ambulating without difficulty.  She denies n/v/f/c, SOB, or CP.  Pt plans on bottlefeeding as she was having difficulty with breastfeeding.  O: Temp:  [98 F (36.7 C)-98.9 F (37.2 C)] 98.9 F (37.2 C) (07/22 0610) Pulse Rate:  [81-108] 81 (07/22 0610) Resp:  [14-26] 20 (07/22 0610) BP: (104-145)/(54-84) 104/69 mmHg (07/22 0610) SpO2:  [94 %-100 %] 100 % (07/22 0610) Weight:  [118.389 kg (261 lb)] 118.389 kg (261 lb) (07/21 1029) Gen: A&Ox3, NAD CV: RRR, no MRG Resp: CTAB Abdomen: obese, soft, NT, ND +BS Uterus: firm, non-tender, below umbilicus Incision: c/d/i, bandage on Ext: No edema, no calf tenderness bilaterally  Labs:  Recent Labs  02/28/16 1020 02/29/16 0528  HGB 13.5 10.5*    A/P: Pt is a 30 y.o. [id number] s/p primary C-section due to suspected LGA, POD#1  - Pain well controlled -GU: UOP is adequate -GI: Tolerating general diet -Activity: encouraged sitting up to chair and ambulation as tolerated -Prophylaxis: early amblution -Labs: stable as above -Lactation support consult -Baby boy circ as outpatient  Continue with routine postoperative care.  Janyth Pupa, DO [phone] (pager) [phone] (office)

## 2016-03-01 MED ORDER — MEASLES, MUMPS & RUBELLA VAC ~~LOC~~ INJ
0.5000 mL | INJECTION | Freq: Once | SUBCUTANEOUS | Status: AC
  Administered 2016-03-01: 0.5 mL via SUBCUTANEOUS
  Filled 2016-03-01: qty 0.5

## 2016-03-01 MED ORDER — IBUPROFEN 600 MG PO TABS: 600.0000 mg | ORAL_TABLET | Freq: Four times a day (QID) | ORAL | 2 refills | Status: DC

## 2016-03-01 NOTE — Discharge Instructions (Signed)
Levonorgestrel intrauterine device (IUD) What is this medicine? LEVONORGESTREL IUD (LEE voe nor jes trel) is a contraceptive (birth control) device. The device is placed inside the uterus by a healthcare professional. It is used to prevent pregnancy and can also be used to treat heavy bleeding that occurs during your period. Depending on the device, it can be used for 3 to 5 years. This medicine may be used for other purposes; ask your health care provider or pharmacist if you have questions. What should I tell my health care provider before I take this medicine? They need to know if you have any of these conditions: -abnormal Pap smear -cancer of the breast, uterus, or cervix -diabetes -endometritis -genital or pelvic infection now or in the past -have more than one sexual partner or your partner has more than one partner -heart disease -history of an ectopic or tubal pregnancy -immune system problems -IUD in place -liver disease or tumor -problems with blood clots or take blood-thinners -use intravenous drugs -uterus of unusual shape -vaginal bleeding that has not been explained -an unusual or allergic reaction to levonorgestrel, other hormones, silicone, or polyethylene, medicines, foods, dyes, or preservatives -pregnant or trying to get pregnant -breast-feeding How should I use this medicine? This device is placed inside the uterus by a health care professional. Talk to your pediatrician regarding the use of this medicine in children. Special care may be needed. Overdosage: If you think you have taken too much of this medicine contact a poison control center or emergency room at once. NOTE: This medicine is only for you. Do not share this medicine with others. What if I miss a dose? This does not apply. What may interact with this medicine? Do not take this medicine with any of the following medications: -amprenavir -bosentan -fosamprenavir This medicine may also interact with  the following medications: -aprepitant -barbiturate medicines for inducing sleep or treating seizures -bexarotene -griseofulvin -medicines to treat seizures like carbamazepine, ethotoin, felbamate, oxcarbazepine, phenytoin, topiramate -modafinil -pioglitazone -rifabutin -rifampin -rifapentine -some medicines to treat HIV infection like atazanavir, indinavir, lopinavir, nelfinavir, tipranavir, ritonavir -St. John's wort -warfarin This list may not describe all possible interactions. Give your health care provider a list of all the medicines, herbs, non-prescription drugs, or dietary supplements you use. Also tell them if you smoke, drink alcohol, or use illegal drugs. Some items may interact with your medicine. What should I watch for while using this medicine? Visit your doctor or health care professional for regular check ups. See your doctor if you or your partner has sexual contact with others, becomes HIV positive, or gets a sexual transmitted disease. This product does not protect you against HIV infection (AIDS) or other sexually transmitted diseases. You can check the placement of the IUD yourself by reaching up to the top of your vagina with clean fingers to feel the threads. Do not pull on the threads. It is a good habit to check placement after each menstrual period. Call your doctor right away if you feel more of the IUD than just the threads or if you cannot feel the threads at all. The IUD may come out by itself. You may become pregnant if the device comes out. If you notice that the IUD has come out use a backup birth control method like condoms and call your health care provider. Using tampons will not change the position of the IUD and are okay to use during your period. What side effects may I notice from receiving this medicine?  Side effects that you should report to your doctor or health care professional as soon as possible: -allergic reactions like skin rash, itching or  hives, swelling of the face, lips, or tongue -fever, flu-like symptoms -genital sores -high blood pressure -no menstrual period for 6 weeks during use -pain, swelling, warmth in the leg -pelvic pain or tenderness -severe or sudden headache -signs of pregnancy -stomach cramping -sudden shortness of breath -trouble with balance, talking, or walking -unusual vaginal bleeding, discharge -yellowing of the eyes or skin Side effects that usually do not require medical attention (report to your doctor or health care professional if they continue or are bothersome): -acne -breast pain -change in sex drive or performance -changes in weight -cramping, dizziness, or faintness while the device is being inserted -headache -irregular menstrual bleeding within first 3 to 6 months of use -nausea This list may not describe all possible side effects. Call your doctor for medical advice about side effects. You may report side effects to FDA at 1-800-FDA-1088. Where should I keep my medicine? This does not apply. NOTE: This sheet is a summary. It may not cover all possible information. If you have questions about this medicine, talk to your doctor, pharmacist, or health care provider.    2016, Elsevier/Gold Standard. (2011-08-27 13:54:04) Iron-Rich Diet Iron is a mineral that helps your body to produce hemoglobin. Hemoglobin is a protein in your red blood cells that carries oxygen to your body's tissues. Eating too little iron may cause you to feel weak and tired, and it can increase your risk for infection. Eating enough iron is necessary for your body's metabolism, muscle function, and nervous system. Iron is naturally found in many foods. It can also be added to foods or fortified in foods. There are two types of dietary iron:  Heme iron. Heme iron is absorbed by the body more easily than nonheme iron. Heme iron is found in meat, poultry, and fish.  Nonheme iron. Nonheme iron is found in dietary  supplements, iron-fortified grains, beans, and vegetables. You may need to follow an iron-rich diet if:  You have been diagnosed with iron deficiency or iron-deficiency anemia.  You have a condition that prevents you from absorbing dietary iron, such as:  Infection in your intestines.  Celiac disease. This involves long-lasting (chronic) inflammation of your intestines.  You do not eat enough iron.  You eat a diet that is high in foods that impair iron absorption.  You have lost a lot of blood.  You have heavy bleeding during your menstrual cycle.  You are pregnant. WHAT IS MY PLAN? Your health care provider may help you to determine how much iron you need per day based on your condition. Generally, when a person consumes sufficient amounts of iron in the diet, the following iron needs are met:  Men.  60-17 years old: 11 mg per day.  80-13 years old: 8 mg per day.  Women.   68-39 years old: 15 mg per day.  9-1 years old: 18 mg per day.  Over 40 years old: 8 mg per day.  Pregnant women: 27 mg per day.  Breastfeeding women: 9 mg per day. WHAT DO I NEED TO KNOW ABOUT AN IRON-RICH DIET?  Eat fresh fruits and vegetables that are high in vitamin C along with foods that are high in iron. This will help increase the amount of iron that your body absorbs from food, especially with foods containing nonheme iron. Foods that are high in vitamin C include  oranges, peppers, tomatoes, and mango.  Take iron supplements only as directed by your health care provider. Overdose of iron can be life-threatening. If you were prescribed iron supplements, take them with orange juice or a vitamin C supplement.  Cook foods in pots and pans that are made from iron.   Eat nonheme iron-containing foods alongside foods that are high in heme iron. This helps to improve your iron absorption.   Certain foods and drinks contain compounds that impair iron absorption. Avoid eating these foods in  the same meal as iron-rich foods or with iron supplements. These include:  Coffee, black tea, and red wine.  Milk, dairy products, and foods that are high in calcium.  Beans, soybeans, and peas.  Whole grains.  When eating foods that contain both nonheme iron and compounds that impair iron absorption, follow these tips to absorb iron better.   Soak beans overnight before cooking.  Soak whole grains overnight and drain them before using.  Ferment flours before baking, such as using yeast in bread dough. WHAT FOODS CAN I EAT? Grains Iron-fortified breakfast cereal. Iron-fortified whole-wheat bread. Enriched rice. Sprouted grains. Vegetables Spinach. Potatoes with skin. Green peas. Broccoli. Red and green bell peppers. Fermented vegetables. Fruits Prunes. Raisins. Oranges. Strawberries. Mango. Grapefruit. Meats and Other Protein Sources Beef liver. Oysters. Beef. Shrimp. [place]. Chicken. [place]. Sardines. Chickpeas. Nuts. Tofu. Beverages Tomato juice. Fresh orange juice. Prune juice. Hibiscus tea. Fortified instant breakfast shakes. Condiments Tahini. Fermented soy sauce. Sweets and Desserts Black-strap molasses.  Other Wheat germ. The items listed above may not be a complete list of recommended foods or beverages. Contact your dietitian for more options. WHAT FOODS ARE NOT RECOMMENDED? Grains Whole grains. Bran cereal. Bran flour. Oats. Vegetables Artichokes. Brussels sprouts. Kale. Fruits Blueberries. Raspberries. Strawberries. Figs. Meats and Other Protein Sources Soybeans. Products made from soy protein. Dairy Milk. Cream. Cheese. Yogurt. Cottage cheese. Beverages Coffee. Black tea. Red wine. Sweets and Desserts Cocoa. Chocolate. Ice cream. Other Basil. Oregano. Parsley. The items listed above may not be a complete list of foods and beverages to avoid. Contact your dietitian for more information.   This information is not intended to replace  advice given to you by your health care provider. Make sure you discuss any questions you have with your health care provider.   Document Released: 03/10/2005 Document Revised: 08/17/2014 Document Reviewed: 02/21/2014 Elsevier Interactive Patient Education 2016 [employer]. Postpartum Depression and Baby Blues The postpartum period begins right after the birth of a baby. During this time, there is often a great amount of joy and excitement. It is also a time of many changes in the life of the parents. Regardless of how many times a mother gives birth, each child brings new challenges and dynamics to the family. It is not unusual to have feelings of excitement along with confusing shifts in moods, emotions, and thoughts. All mothers are at risk of developing postpartum depression or the "baby blues." These mood changes can occur right after giving birth, or they may occur many months after giving birth. The baby blues or postpartum depression can be mild or severe. Additionally, postpartum depression can go away rather quickly, or it can be a long-term condition.  CAUSES Raised hormone levels and the rapid drop in those levels are thought to be a main cause of postpartum depression and the baby blues. A number of hormones change during and after pregnancy. Estrogen and progesterone usually decrease right after the delivery of your baby. The levels of  thyroid hormone and various cortisol steroids also rapidly drop. Other factors that play a role in these mood changes include major life events and genetics.  RISK FACTORS If you have any of the following risks for the baby blues or postpartum depression, know what symptoms to watch out for during the postpartum period. Risk factors that may increase the likelihood of getting the baby blues or postpartum depression include:  Having a personal or family history of depression.   Having depression while being pregnant.   Having premenstrual mood issues or  mood issues related to oral contraceptives.  Having a lot of life stress.   Having marital conflict.   Lacking a social support network.   Having a baby with special needs.   Having health problems, such as diabetes.  SIGNS AND SYMPTOMS Symptoms of baby blues include:  Brief changes in mood, such as going from extreme happiness to sadness.  Decreased concentration.   Difficulty sleeping.   Crying spells, tearfulness.   Irritability.   Anxiety.  Symptoms of postpartum depression typically begin within the first month after giving birth. These symptoms include:  Difficulty sleeping or excessive sleepiness.   Marked weight loss.   Agitation.   Feelings of worthlessness.   Lack of interest in activity or food.  Postpartum psychosis is a very serious condition and can be dangerous. Fortunately, it is rare. Displaying any of the following symptoms is cause for immediate medical attention. Symptoms of postpartum psychosis include:   Hallucinations and delusions.   Bizarre or disorganized behavior.   Confusion or disorientation.  DIAGNOSIS  A diagnosis is made by an evaluation of your symptoms. There are no medical or lab tests that lead to a diagnosis, but there are various questionnaires that a health care provider may use to identify those with the baby blues, postpartum depression, or psychosis. Often, a screening tool called the [place] Postnatal Depression Scale is used to diagnose depression in the postpartum period.  TREATMENT The baby blues usually goes away on its own in 1-2 weeks. Social support is often all that is needed. You will be encouraged to get adequate sleep and rest. Occasionally, you may be given medicines to help you sleep.  Postpartum depression requires treatment because it can last several months or longer if it is not treated. Treatment may include individual or group therapy, medicine, or both to address any social,  physiological, and psychological factors that may play a role in the depression. Regular exercise, a healthy diet, rest, and social support may also be strongly recommended.  Postpartum psychosis is more serious and needs treatment right away. Hospitalization is often needed. HOME CARE INSTRUCTIONS  Get as much rest as you can. Nap when the baby sleeps.   Exercise regularly. Some women find yoga and walking to be beneficial.   Eat a balanced and nourishing diet.   Do little things that you enjoy. Have a cup of tea, take a bubble bath, read your favorite magazine, or listen to your favorite music.  Avoid alcohol.   Ask for help with household chores, cooking, grocery shopping, or running errands as needed. Do not try to do everything.   Talk to people close to you about how you are feeling. Get support from your partner, family members, friends, or other new moms.  Try to stay positive in how you think. Think about the things you are grateful for.   Do not spend a lot of time alone.   Only take over-the-counter or prescription  medicine as directed by your health care provider.  Keep all your postpartum appointments.   Let your health care provider know if you have any concerns.  SEEK MEDICAL CARE IF: You are having a reaction to or problems with your medicine. SEEK IMMEDIATE MEDICAL CARE IF:  You have suicidal feelings.   You think you may harm the baby or someone else. MAKE SURE YOU:  Understand these instructions.  Will watch your condition.  Will get help right away if you are not doing well or get worse.   This information is not intended to replace advice given to you by your health care provider. Make sure you discuss any questions you have with your health care provider.   Document Released: 04/30/2004 Document Revised: 08/01/2013 Document Reviewed: 05/08/2013 Elsevier Interactive Patient Education 2016 [place]. Postpartum Care After Cesarean  Delivery After you deliver your newborn (postpartum period), the usual stay in the hospital is 24-72 hours. If there were problems with your labor or delivery, or if you have other medical problems, you might be in the hospital longer.  While you are in the hospital, you will receive help and instructions on how to care for yourself and your newborn during the postpartum period.  While you are in the hospital:  It is normal for you to have pain or discomfort from the incision in your abdomen. Be sure to tell your nurses when you are having pain, where the pain is located, and what makes the pain worse.  If you are breastfeeding, you may feel uncomfortable contractions of your uterus for a couple of weeks. This is normal. The contractions help your uterus get back to normal size.  It is normal to have some bleeding after delivery.  For the first 1-3 days after delivery, the flow is red and the amount may be similar to a period.  It is common for the flow to start and stop.  In the first few days, you may pass some small clots. Let your nurses know if you begin to pass large clots or your flow increases.  Do not  flush blood clots down the toilet before having the nurse look at them.  During the next 3-10 days after delivery, your flow should become more watery and pink or brown-tinged in color.  Ten to fourteen days after delivery, your flow should be a small amount of yellowish-white discharge.  The amount of your flow will decrease over the first few weeks after delivery. Your flow may stop in 6-8 weeks. Most women have had their flow stop by 12 weeks after delivery.  You should change your sanitary pads frequently.  Wash your hands thoroughly with soap and water for at least 20 seconds after changing pads, using the toilet, or before holding or feeding your newborn.  Your intravenous (IV) tubing will be removed when you are drinking enough fluids.  The urine drainage tube (urinary  catheter) that was inserted before delivery may be removed within 6-8 hours after delivery or when feeling returns to your legs. You should feel like you need to empty your bladder within the first 6-8 hours after the catheter has been removed.  In case you become weak, lightheaded, or faint, call your nurse before you get out of bed for the first time and before you take a shower for the first time.  Within the first few days after delivery, your breasts may begin to feel tender and full. This is called engorgement. Breast tenderness usually  goes away within 48-72 hours after engorgement occurs. You may also notice milk leaking from your breasts. If you are not breastfeeding, do not stimulate your breasts. Breast stimulation can make your breasts produce more milk.  Spending as much time as possible with your newborn is very important. During this time, you and your newborn can feel close and get to know each other. Having your newborn stay in your room (rooming in) will help to strengthen the bond with your newborn. It will give you time to get to know your newborn and become comfortable caring for your newborn.  Your hormones change after delivery. Sometimes the hormone changes can temporarily cause you to feel sad or tearful. These feelings should not last more than a few days. If these feelings last longer than that, you should talk to your caregiver.  If desired, talk to your caregiver about methods of family planning or contraception.  Talk to your caregiver about immunizations. Your caregiver may want you to have the following immunizations before leaving the hospital:  Tetanus, diphtheria, and pertussis (Tdap) or tetanus and diphtheria (Td) immunization. It is very important that you and your family (including grandparents) or others caring for your newborn are up-to-date with the Tdap or Td immunizations. The Tdap or Td immunization can help protect your newborn from getting ill.  Rubella  immunization.  Varicella (chickenpox) immunization.  Influenza immunization. You should receive this annual immunization if you did not receive the immunization during your pregnancy.   This information is not intended to replace advice given to you by your health care provider. Make sure you discuss any questions you have with your health care provider.   Document Released: 04/20/2012 Document Reviewed: 04/20/2012 Elsevier Interactive Patient Education [employer].

## 2016-03-01 NOTE — Discharge Summary (Signed)
OB Discharge Summary     Patient Name: Brenda [name] DOB: 21-May-1986 MRN: [id number]  Date of admission: 02/28/2016 Delivering MD: [name]   Date of discharge: 03/02/2016  Admitting diagnosis: Suspected LGA Intrauterine pregnancy: [redacted]w[redacted]d     Secondary diagnosis:  Active Problems:   S/P primary low transverse C-section  Additional problems: Obesity, fibroids, seasonal allergies, anemia     Discharge diagnosis: Term Pregnancy Delivered, Anemia                                                                                                Post partum procedures:None  Augmentation: None  Complications: None  Hospital course:  Sceduled C/S   30 y.o. yo G2P2002 at [redacted]w[redacted]d was admitted to the hospital 02/28/2016 for scheduled cesarean section with the following indication:Macrosomia and Elective Primary.  Membrane Rupture Time/Date: 1:28 PM ,02/28/2016   Patient delivered a Viable infant.02/28/2016  Details of operation can be found in separate operative note.  Pateint had an uncomplicated postpartum course.  She is ambulating, tolerating a regular diet, passing flatus, and urinating well. Patient is discharged home in stable condition on  03/01/2016.          Physical exam  Vitals:   02/29/16 1015 02/29/16 1500 02/29/16 1831 03/01/16 0635  BP: (!) 107/49 (!) 108/56 112/64 123/78  Pulse: (!) 109 94 (!) 104 93  Resp: 18 16 20 18   Temp: 98.6 F (37 C) 98.9 F (37.2 C) 97.9 F (36.6 C) 98.2 F (36.8 C)  TempSrc: Oral Oral Oral   SpO2: 99% 97%    Weight:      Height:       General: alert, cooperative and no distress Lochia: appropriate Uterine Fundus: firm Incision: Dressing is clean, dry, and intact DVT Evaluation: No evidence of DVT seen on physical exam. Negative Homan's sign. No cords or calf tenderness. No significant calf/ankle edema. Labs: Lab Results  Component Value Date   WBC 11.9 (H) 02/29/2016   HGB 10.5 (L) 02/29/2016   HCT 31.5 (L) 02/29/2016   MCV 81.6 02/29/2016   PLT 164 02/29/2016   CMP Latest Ref Rng & Units 06/16/2015  Glucose 65 - 99 mg/dL 99  BUN 6 - 20 mg/dL 20  Creatinine 0.44 - 1.00 mg/dL 0.84  Sodium 135 - 145 mmol/L 137  Potassium 3.5 - 5.1 mmol/L 4.2  Chloride 101 - 111 mmol/L 105  CO2 22 - 32 mmol/L 24  Calcium 8.9 - 10.3 mg/dL 8.9  Total Protein 6.5 - 8.1 g/dL 7.5  Total Bilirubin 0.3 - 1.2 mg/dL 0.5  Alkaline Phos 38 - 126 U/L 30(L)  AST 15 - 41 U/L 21  ALT 14 - 54 U/L 25   Prenatal labs: ABO, Rh: --/--/B POS (07/21 1020) Antibody: NEG (07/21 1020) Rubella:  Immune RPR: Nonreactive (04/13 0000)  HBsAg:   neg HIV: Non-reactive (04/13 0000)  GBS: Negative (01/14 0000)   Discharge instruction: per After Visit Summary and "Baby and Me Booklet".  After visit meds:    Medication List    TAKE these medications   acyclovir cream 5 %  Commonly known as:  ZOVIRAX Apply 1 application topically as needed (outbreaks).   albuterol 108 (90 Base) MCG/ACT inhaler Commonly known as:  PROVENTIL HFA;VENTOLIN HFA Inhale 2 puffs into the lungs every 6 (six) hours as needed for wheezing or shortness of breath.   ibuprofen 600 MG tablet Commonly known as:  ADVIL,MOTRIN Take 1 tablet (600 mg total) by mouth every 6 (six) hours.   prenatal multivitamin Tabs tablet Take 1 tablet by mouth daily at 12 noon.   valACYclovir 500 MG tablet Commonly known as:  VALTREX Take 500 mg by mouth 1 day or 1 dose. Takes with breakouts prn     Ferrous Sulfate 325 mg once daily    Diet: routine diet  Activity: Advance as tolerated. Pelvic rest for 6 weeks.   Outpatient follow up:5 weeks  Postpartum contraception: IUD Mirena  Newborn Data: Live born female  Birth Weight: 8 lb 6.6 oz (3815 g) APGARS: 8, 9 Circumcision: Outpatient  Baby Feeding: Bottle Disposition:home with mother   03/01/2016 Farrel Gordon, CNM

## 2016-05-21 ENCOUNTER — Encounter: Payer: Self-pay | Admitting: Internal Medicine

## 2016-11-18 DIAGNOSIS — Z124 Encounter for screening for malignant neoplasm of cervix: Secondary | ICD-10-CM | POA: Diagnosis not present

## 2016-11-18 DIAGNOSIS — Z01419 Encounter for gynecological examination (general) (routine) without abnormal findings: Secondary | ICD-10-CM | POA: Diagnosis not present

## 2016-11-18 LAB — HEPATIC FUNCTION PANEL
ALT: 18 U/L (ref 7–35)
AST: 15 U/L (ref 13–35)
Alkaline Phosphatase: 37 U/L (ref 25–125)
BILIRUBIN, TOTAL: 0.3 mg/dL

## 2016-11-18 LAB — LIPID PANEL
Cholesterol: 154 mg/dL (ref 0–200)
HDL: 39 mg/dL (ref 35–70)
LDL Cholesterol: 95 mg/dL
Triglycerides: 108 mg/dL (ref 40–160)

## 2016-11-18 LAB — CBC AND DIFFERENTIAL
HCT: 41 % (ref 36–46)
HEMATOCRIT: 41 % (ref 36–46)
HEMOGLOBIN: 13.4 g/dL (ref 12.0–16.0)
Hemoglobin: 13.4 g/dL (ref 12.0–16.0)
PLATELETS: 240 10*3/uL (ref 150–399)
Platelets: 240 10*3/uL (ref 150–399)
WBC: 3.8 10^3/mL
WBC: 3.8 10^3/mL

## 2016-11-18 LAB — HEMOGLOBIN A1C
HEMOGLOBIN A1C: 5.7
Hemoglobin A1C: 5.7

## 2016-11-18 LAB — BASIC METABOLIC PANEL
BUN: 17 mg/dL (ref 4–21)
CREATININE: 1 mg/dL (ref 0.5–1.1)
Glucose: 84 mg/dL
Potassium: 4.3 mmol/L (ref 3.4–5.3)
Sodium: 138 mmol/L (ref 137–147)

## 2016-11-18 LAB — VITAMIN D 25 HYDROXY (VIT D DEFICIENCY, FRACTURES): Vit D, 25-Hydroxy: 17

## 2016-11-18 LAB — TSH
TSH: 0.7 u[IU]/mL (ref 0.41–5.90)
TSH: 0.7 u[IU]/mL (ref 0.41–5.90)

## 2016-11-24 ENCOUNTER — Encounter: Payer: Self-pay | Admitting: Internal Medicine

## 2016-11-24 DIAGNOSIS — R7303 Prediabetes: Secondary | ICD-10-CM | POA: Insufficient documentation

## 2016-11-24 DIAGNOSIS — E1169 Type 2 diabetes mellitus with other specified complication: Secondary | ICD-10-CM | POA: Insufficient documentation

## 2016-11-24 DIAGNOSIS — E119 Type 2 diabetes mellitus without complications: Secondary | ICD-10-CM | POA: Insufficient documentation

## 2016-11-24 DIAGNOSIS — E559 Vitamin D deficiency, unspecified: Secondary | ICD-10-CM | POA: Insufficient documentation

## 2016-12-01 ENCOUNTER — Encounter: Payer: Self-pay | Admitting: Nurse Practitioner

## 2016-12-01 ENCOUNTER — Ambulatory Visit (INDEPENDENT_AMBULATORY_CARE_PROVIDER_SITE_OTHER): Payer: 59 | Admitting: Nurse Practitioner

## 2016-12-01 VITALS — BP 120/78 | HR 82 | Temp 98.1°F | Ht 63.5 in | Wt 243.0 lb

## 2016-12-01 DIAGNOSIS — E559 Vitamin D deficiency, unspecified: Secondary | ICD-10-CM

## 2016-12-01 DIAGNOSIS — R7303 Prediabetes: Secondary | ICD-10-CM

## 2016-12-01 DIAGNOSIS — J302 Other seasonal allergic rhinitis: Secondary | ICD-10-CM | POA: Diagnosis not present

## 2016-12-01 DIAGNOSIS — R739 Hyperglycemia, unspecified: Secondary | ICD-10-CM | POA: Insufficient documentation

## 2016-12-01 MED ORDER — SALINE SPRAY 0.65 % NA SOLN: 1.0000 | NASAL | 0 refills | Status: DC | PRN

## 2016-12-01 MED ORDER — MONTELUKAST SODIUM 10 MG PO TABS: 10.0000 mg | ORAL_TABLET | Freq: Every day | ORAL | 3 refills | Status: DC

## 2016-12-01 MED ORDER — CETIRIZINE HCL 10 MG PO TABS: 10.0000 mg | ORAL_TABLET | Freq: Every day | ORAL | 0 refills | Status: DC

## 2016-12-01 MED ORDER — FLUTICASONE PROPIONATE 50 MCG/ACT NA SUSP: 2.0000 | Freq: Every day | NASAL | 1 refills | Status: DC

## 2016-12-01 NOTE — Progress Notes (Signed)
Subjective:  Patient ID: Brenda [name], female    DOB: [birth year]  Age: 31 y.o. MRN: [id number]  CC: Follow-up (saw GYN 2 wks. hemoglobin low,HDL low and Vit D Low. no copy--allergy?)  HPI Abnormal labs from GYN: CBC, lipid panel, vitamin d and Hgb A1c done by [facility] 2weeks ago. She was informed labs were abnormal bur does not remember numbers, nor does she have copy of labs.  Labs reviewed on Mychart account A1c 5.7 LDL 115, HDL 39, Trig 108, TC 154, ratio 3.9, LDL-c 95. CMP normal, CBC normal TSH normal Vitamin D 17  Allergic rhinitis: Nasal congestion x 1week. Hx of seasonal allergies. No fever, no purulent drainage.  Outpatient Medications Prior to Visit  Medication Sig Dispense Refill  . acyclovir cream (ZOVIRAX) 5 % Apply 1 application topically as needed (outbreaks).    Marland Kitchen albuterol (PROVENTIL HFA;VENTOLIN HFA) 108 (90 BASE) MCG/ACT inhaler Inhale 2 puffs into the lungs every 6 (six) hours as needed for wheezing or shortness of breath. 1 Inhaler 2  . ibuprofen (ADVIL,MOTRIN) 600 MG tablet Take 1 tablet (600 mg total) by mouth every 6 (six) hours. 60 tablet 2  . Prenatal Vit-Fe Fumarate-FA (PRENATAL MULTIVITAMIN) TABS tablet Take 1 tablet by mouth daily at 12 noon.    . valACYclovir (VALTREX) 500 MG tablet Take 500 mg by mouth 1 day or 1 dose. Takes with breakouts prn     No facility-administered medications prior to visit.     ROS See HPI  Objective:  BP 120/78   Pulse 82   Temp 98.1 F (36.7 C)   Ht 5' 3.5" (1.613 m)   Wt 243 lb (110.2 kg)   SpO2 98%   BMI 42.37 kg/m   BP Readings from Last 3 Encounters:  12/01/16 120/78  03/01/16 123/78  06/20/15 114/76    Wt Readings from Last 3 Encounters:  12/01/16 243 lb (110.2 kg)  02/28/16 261 lb (118.4 kg)  06/20/15 219 lb (99.3 kg)    Physical Exam  Constitutional: She is oriented to person, place, and time. No distress.  HENT:  Right Ear: Tympanic membrane, external ear and ear canal  normal.  Left Ear: Tympanic membrane, external ear and ear canal normal.  Nose: Mucosal edema and rhinorrhea present. Right sinus exhibits no maxillary sinus tenderness and no frontal sinus tenderness. Left sinus exhibits no maxillary sinus tenderness and no frontal sinus tenderness.  Mouth/Throat: Posterior oropharyngeal erythema present. No oropharyngeal exudate.  Eyes: No scleral icterus.  Neck: Normal range of motion. Neck supple.  Cardiovascular: Normal rate, regular rhythm and normal heart sounds.   Pulmonary/Chest: Effort normal and breath sounds normal. No respiratory distress.  Abdominal: Soft. She exhibits no distension.  Musculoskeletal: Normal range of motion. She exhibits no edema.  Lymphadenopathy:    She has no cervical adenopathy.  Neurological: She is alert and oriented to person, place, and time.  Skin: Skin is warm and dry.  Psychiatric: She has a normal mood and affect. Her behavior is normal.    Lab Results  Component Value Date   WBC 3.8 11/18/2016   WBC 3.8 11/18/2016   HGB 13.4 11/18/2016   HGB 13.4 11/18/2016   HCT 41 11/18/2016   HCT 41 11/18/2016   PLT 240 11/18/2016   PLT 240 11/18/2016   GLUCOSE 99 06/16/2015   CHOL 154 11/18/2016   TRIG 108 11/18/2016   HDL 39 11/18/2016   LDLCALC 95 11/18/2016   ALT 18 11/18/2016   AST  15 11/18/2016   NA 138 11/18/2016   K 4.3 11/18/2016   CL 105 06/16/2015   CREATININE 1.0 11/18/2016   BUN 17 11/18/2016   CO2 24 06/16/2015   TSH 0.70 11/18/2016   TSH 0.70 11/18/2016   HGBA1C 5.7 11/18/2016   HGBA1C 5.7 11/18/2016    No results found.  Assessment & Plan:   Brenda [name] was seen today for follow-up.  Diagnoses and all orders for this visit:  Seasonal allergic rhinitis, unspecified trigger -     montelukast (SINGULAIR) 10 MG tablet; Take 1 tablet (10 mg total) by mouth at bedtime. -     fluticasone (FLONASE) 50 MCG/ACT nasal spray; Place 2 sprays into both nostrils daily. -     cetirizine (ZYRTEC) 10 MG  tablet; Take 1 tablet (10 mg total) by mouth daily. -     sodium chloride (OCEAN) 0.65 % SOLN nasal spray; Place 1 spray into both nostrils as needed for congestion.  Vitamin D deficiency  Prediabetes   I have discontinued Brenda [name]'s D3-50. I am also having her start on montelukast, fluticasone, cetirizine, and sodium chloride. Additionally, I am having her maintain her albuterol, valACYclovir, prenatal multivitamin, acyclovir cream, ibuprofen, and Vitamin D (Ergocalciferol).  Meds ordered this encounter  Medications  . DISCONTD: D3-50 50000 units capsule    Sig: TAKE 1 CAPSULE(S) TWICE A WEEK BY ORAL ROUTE AS DIRECTED.    Refill:  0  . Vitamin D, Ergocalciferol, (DRISDOL) 50000 units CAPS capsule    Sig: Take 1 capsule (50,000 Units total) by mouth 2 (two) times a week.    Dispense:  30 capsule    Refill:  0    Order Specific Question:   Supervising Provider    Answer:   Cassandria Anger [[id number]]  . montelukast (SINGULAIR) 10 MG tablet    Sig: Take 1 tablet (10 mg total) by mouth at bedtime.    Dispense:  30 tablet    Refill:  3    Order Specific Question:   Supervising Provider    Answer:   Cassandria Anger [[id number]]  . fluticasone (FLONASE) 50 MCG/ACT nasal spray    Sig: Place 2 sprays into both nostrils daily.    Dispense:  16 g    Refill:  1    Order Specific Question:   Supervising Provider    Answer:   Cassandria Anger [[id number]]  . cetirizine (ZYRTEC) 10 MG tablet    Sig: Take 1 tablet (10 mg total) by mouth daily.    Dispense:  30 tablet    Refill:  0    Order Specific Question:   Supervising Provider    Answer:   Cassandria Anger [[id number]]  . sodium chloride (OCEAN) 0.65 % SOLN nasal spray    Sig: Place 1 spray into both nostrils as needed for congestion.    Dispense:  15 mL    Refill:  0    Order Specific Question:   Supervising Provider    Answer:   Cassandria Anger [[id number]]    Follow-up: Return if symptoms worsen or fail to improve.  Wilfred Lacy, NP

## 2016-12-01 NOTE — Progress Notes (Signed)
Pre visit review using our clinic review tool, if applicable. No additional management support is needed unless otherwise documented below in the visit note. 

## 2016-12-01 NOTE — Patient Instructions (Signed)
Diabetes Mellitus and Exercise Exercising regularly is important for your overall health, especially when you have diabetes (diabetes mellitus). Exercising is not only about losing weight. It has many health benefits, such as increasing muscle strength and bone density and reducing body fat and stress. This leads to improved fitness, flexibility, and endurance, all of which result in better overall health. Exercise has additional benefits for people with diabetes, including:  Reducing appetite.  Helping to lower and control blood glucose.  Lowering blood pressure.  Helping to control amounts of fatty substances (lipids) in the blood, such as cholesterol and triglycerides.  Helping the body to respond better to insulin (improving insulin sensitivity).  Reducing how much insulin the body needs.  Decreasing the risk for heart disease by:  Lowering cholesterol and triglyceride levels.  Increasing the levels of good cholesterol.  Lowering blood glucose levels. What is my activity plan? Your health care provider or certified diabetes educator can help you make a plan for the type and frequency of exercise (activity plan) that works for you. Make sure that you:  Do at least 150 minutes of moderate-intensity or vigorous-intensity exercise each week. This could be brisk walking, biking, or water aerobics.  Do stretching and strength exercises, such as yoga or weightlifting, at least 2 times a week.  Spread out your activity over at least 3 days of the week.  Get some form of physical activity every day.  Do not go more than 2 days in a row without some kind of physical activity.  Avoid being inactive for more than 90 minutes at a time. Take frequent breaks to walk or stretch.  Choose a type of exercise or activity that you enjoy, and set realistic goals.  Start slowly, and gradually increase the intensity of your exercise over time. What do I need to know about managing my  diabetes?  Check your blood glucose before and after exercising.  If your blood glucose is higher than 240 mg/dL (13.3 mmol/L) before you exercise, check your urine for ketones. If you have ketones in your urine, do not exercise until your blood glucose returns to normal.  Know the symptoms of low blood glucose (hypoglycemia) and how to treat it. Your risk for hypoglycemia increases during and after exercise. Common symptoms of hypoglycemia can include:  Hunger.  Anxiety.  Sweating and feeling clammy.  Confusion.  Dizziness or feeling light-headed.  Increased heart rate or palpitations.  Blurry vision.  Tingling or numbness around the mouth, lips, or tongue.  Tremors or shakes.  Irritability.  Keep a rapid-acting carbohydrate snack available before, during, and after exercise to help prevent or treat hypoglycemia.  Avoid injecting insulin into areas of the body that are going to be exercised. For example, avoid injecting insulin into:  The arms, when playing tennis.  The legs, when jogging.  Keep records of your exercise habits. Doing this can help you and your health care provider adjust your diabetes management plan as needed. Write down:  Food that you eat before and after you exercise.  Blood glucose levels before and after you exercise.  The type and amount of exercise you have done.  When your insulin is expected to peak, if you use insulin. Avoid exercising at times when your insulin is peaking.  When you start a new exercise or activity, work with your health care provider to make sure the activity is safe for you, and to adjust your insulin, medicines, or food intake as needed.  Drink plenty   of water while you exercise to prevent dehydration or heat stroke. Drink enough fluid to keep your urine clear or pale yellow. This information is not intended to replace advice given to you by your health care provider. Make sure you discuss any questions you have with  your health care provider. Document Released: 10/17/2003 Document Revised: 02/14/2016 Document Reviewed: 01/06/2016 Elsevier Interactive Patient Education  2017 [place] for Diabetes Mellitus, Adult Carbohydrate counting is a method for keeping track of how many carbohydrates you eat. Eating carbohydrates naturally increases the amount of sugar (glucose) in the blood. Counting how many carbohydrates you eat helps keep your blood glucose within normal limits, which helps you manage your diabetes (diabetes mellitus). It is important to know how many carbohydrates you can safely have in each meal. This is different for every person. A diet and nutrition specialist (registered dietitian) can help you make a meal plan and calculate how many carbohydrates you should have at each meal and snack. Carbohydrates are found in the following foods:  Grains, such as breads and cereals.  Dried beans and soy products.  Starchy vegetables, such as potatoes, peas, and corn.  Fruit and fruit juices.  Milk and yogurt.  Sweets and snack foods, such as cake, cookies, candy, chips, and soft drinks. How do I count carbohydrates? There are two ways to count carbohydrates in food. You can use either of the methods or a combination of both. Reading "Nutrition Facts" on packaged food  The "Nutrition Facts" list is included on the labels of almost all packaged foods and beverages in the [place] It includes:  The serving size.  Information about nutrients in each serving, including the grams (g) of carbohydrate per serving. To use the "Nutrition Facts":  Decide how many servings you will have.  Multiply the number of servings by the number of carbohydrates per serving.  The resulting number is the total amount of carbohydrates that you will be having. Learning standard serving sizes of other foods  When you eat foods containing carbohydrates that are not packaged or do not include  "Nutrition Facts" on the label, you need to measure the servings in order to count the amount of carbohydrates:  Measure the foods that you will eat with a food scale or measuring cup, if needed.  Decide how many standard-size servings you will eat.  Multiply the number of servings by 15. Most carbohydrate-rich foods have about 15 g of carbohydrates per serving.  For example, if you eat 8 oz (170 g) of strawberries, you will have eaten 2 servings and 30 g of carbohydrates (2 servings x 15 g = 30 g).  For foods that have more than one food mixed, such as soups and casseroles, you must count the carbohydrates in each food that is included. The following list contains standard serving sizes of common carbohydrate-rich foods. Each of these servings has about 15 g of carbohydrates:   hamburger bun or  English muffin.   oz (15 mL) syrup.   oz (14 g) jelly.  1 slice of bread.  1 six-inch tortilla.  3 oz (85 g) cooked rice or pasta.  4 oz (113 g) cooked dried beans.  4 oz (113 g) starchy vegetable, such as peas, corn, or potatoes.  4 oz (113 g) hot cereal.  4 oz (113 g) mashed potatoes or  of a large baked potato.  4 oz (113 g) canned or frozen fruit.  4 oz (120 mL) fruit juice.  4-6 crackers.  6 chicken nuggets.  6 oz (170 g) unsweetened dry cereal.  6 oz (170 g) plain fat-free yogurt or yogurt sweetened with artificial sweeteners.  8 oz (240 mL) milk.  8 oz (170 g) fresh fruit or one small piece of fruit.  24 oz (680 g) popped popcorn. Example of carbohydrate counting Sample meal   3 oz (85 g) chicken breast.  6 oz (170 g) brown rice.  4 oz (113 g) corn.  8 oz (240 mL) milk.  8 oz (170 g) strawberries with sugar-free whipped topping. Carbohydrate calculation  1. Identify the foods that contain carbohydrates:  Rice.  Corn.  Milk.  Strawberries. 2. Calculate how many servings you have of each food:  2 servings rice.  1 serving corn.  1  serving milk.  1 serving strawberries. 3. Multiply each number of servings by 15 g:  2 servings rice x 15 g = 30 g.  1 serving corn x 15 g = 15 g.  1 serving milk x 15 g = 15 g.  1 serving strawberries x 15 g = 15 g. 4. Add together all of the amounts to find the total grams of carbohydrates eaten:  30 g + 15 g + 15 g + 15 g = 75 g of carbohydrates total. This information is not intended to replace advice given to you by your health care provider. Make sure you discuss any questions you have with your health care provider. Document Released: 07/27/2005 Document Revised: 02/14/2016 Document Reviewed: 01/08/2016 Elsevier Interactive Patient Education  2017 [employer].

## 2016-12-03 ENCOUNTER — Encounter: Payer: Self-pay | Admitting: Internal Medicine

## 2016-12-03 NOTE — Progress Notes (Signed)
Result Abstracted 

## 2016-12-28 ENCOUNTER — Encounter: Payer: 59 | Admitting: Internal Medicine

## 2017-01-30 NOTE — Progress Notes (Signed)
Subjective:    Patient ID: Brenda [name], female    DOB: 1985/10/28, 31 y.o.   MRN: [id number]  HPI She is here for a physical exam.   She is concerned about her weight.  She is not doing much to lose weight right now.  She is not exercising and not eating healthy.    Medications and allergies reviewed with patient and updated if appropriate.  Patient Active Problem List   Diagnosis Date Noted  . Allergic rhinitis 02/01/2017  . Genital herpes simplex 02/01/2017  . Prediabetes 11/24/2016  . Vitamin D deficiency 11/24/2016  . S/P primary low transverse C-section 02/28/2016  . Seasonal asthma 08/06/2011  . Morbid obesity ([place]) 08/06/2011  . Fibroids 08/06/2011    Current Outpatient Prescriptions on File Prior to Visit  Medication Sig Dispense Refill  . acyclovir cream (ZOVIRAX) 5 % Apply 1 application topically as needed (outbreaks).    . cetirizine (ZYRTEC) 10 MG tablet Take 1 tablet (10 mg total) by mouth daily. 30 tablet 0  . fluticasone (FLONASE) 50 MCG/ACT nasal spray Place 2 sprays into both nostrils daily. 16 g 1  . ibuprofen (ADVIL,MOTRIN) 600 MG tablet Take 1 tablet (600 mg total) by mouth every 6 (six) hours. 60 tablet 2  . Prenatal Vit-Fe Fumarate-FA (PRENATAL MULTIVITAMIN) TABS tablet Take 1 tablet by mouth daily at 12 noon.    . sodium chloride (OCEAN) 0.65 % SOLN nasal spray Place 1 spray into both nostrils as needed for congestion. 15 mL 0  . valACYclovir (VALTREX) 500 MG tablet Take 500 mg by mouth 1 day or 1 dose. Takes with breakouts prn     No current facility-administered medications on file prior to visit.     Past Medical History:  Diagnosis Date  . Abnormal Pap smear 2007   [facility]  . Asthma    childhood  . Fibroid   . Gestational diabetes    first pregnancy, diet controlled  . H/O chlamydia infection 2010  . H/O varicella   . Mass of neck 03/02/2014  . Obese   . Rubella non-immune status 01/11/2012  . Vaginal Pap smear, abnormal     Past  Surgical History:  Procedure Laterality Date  . CESAREAN SECTION N/A 02/28/2016   Procedure: CESAREAN SECTION;  Surgeon: [name], MD;  Location: [facility];  Service: Obstetrics;  Laterality: N/A;  . NO PAST SURGERIES      Social History   Social History  . Marital status: Single    Spouse name: N/A  . Number of children: N/A  . Years of education: N/A   Social History Main Topics  . Smoking status: Never Smoker  . Smokeless tobacco: Never Used  . Alcohol use Yes     Comment: occasional  . Drug use: No  . Sexual activity: Yes    Birth control/ protection: Condom   Other Topics Concern  . None   Social History Narrative   Engaged, 1 year old daughter    Family History  Problem Relation Age of Onset  . Hypertension Mother        diet controlled no meds  . Hypertension Father   . Alcohol abuse Father   . Heart disease Maternal Aunt   . Mental illness Maternal Aunt        bipolar  . Diabetes Maternal Aunt   . Heart disease Maternal Grandmother   . Hypertension Maternal Grandmother   . Thrombophlebitis Maternal Grandmother   . Diabetes Maternal  Grandmother   . Stroke Maternal Grandmother   . Hypertension Paternal Grandmother   . Heart disease Paternal Grandmother   . Asthma Cousin   . Cancer Cousin   . Heart disease Cousin        congenital heart disease  . Learning disabilities Cousin   . Anesthesia problems Neg Hx     Review of Systems  Constitutional: Negative for chills and fever.  Eyes: Negative for visual disturbance.  Respiratory: Negative for cough, shortness of breath and wheezing.   Cardiovascular: Negative for chest pain and palpitations.  Gastrointestinal: Negative for abdominal pain, blood in stool, constipation, diarrhea and nausea.       GERD at night - twice this past week  Genitourinary: Negative for dysuria and hematuria.  Musculoskeletal: Positive for back pain (sciatica left leg - intermittent). Negative for arthralgias.    Skin: Negative for color change and rash.  Neurological: Positive for headaches (sinus ). Negative for dizziness and light-headedness.  Psychiatric/Behavioral: Negative for dysphoric mood. The patient is not nervous/anxious.        Objective:   Vitals:   02/01/17 0926  BP: 124/84  Pulse: 76  Resp: 16  Temp: 98.6 F (37 C)   Filed Weights   02/01/17 0926  Weight: 243 lb (110.2 kg)   Body mass index is 41.71 kg/m.  Wt Readings from Last 3 Encounters:  02/01/17 243 lb (110.2 kg)  12/01/16 243 lb (110.2 kg)  02/28/16 261 lb (118.4 kg)     Physical Exam Constitutional: She appears well-developed and well-nourished. No distress.  HENT:  Head: Normocephalic and atraumatic.  Right Ear: External ear normal. Normal ear canal and TM Left Ear: External ear normal.  Normal ear canal and TM Mouth/Throat: Oropharynx is clear and moist.  Eyes: Conjunctivae and EOM are normal.  Neck: Neck supple. No tracheal deviation present. No thyromegaly present.  No carotid bruit  Cardiovascular: Normal rate, regular rhythm and normal heart sounds.   No murmur heard.  No edema. Pulmonary/Chest: Effort normal and breath sounds normal. No respiratory distress. She has no wheezes. She has no rales.  Breast: deferred to Gyn Abdominal: Soft. She exhibits no distension. There is no tenderness.  Lymphadenopathy: She has no cervical adenopathy.  Skin: Skin is warm and dry. She is not diaphoretic.  Psychiatric: She has a normal mood and affect. Her behavior is normal.         Assessment & Plan:   Physical exam: Screening blood work  ordered Immunizations  Up to date  Gyn   Up to date  Exercise - start regular exercise - aim for 30 minutes five days a week Weight - stressed weight loss - she wants to lose weight -- advised her to concentrate on healthy lifestyle not the scale - decrease carbs, sugars and increase fruits, veges and lean protein,  Decrease portions overall Skin   No  concerns Substance abuse  none  See Problem List for Assessment and Plan of chronic medical problems.   FU in 6 months - obesity and prediabetes

## 2017-01-30 NOTE — Patient Instructions (Addendum)
Test(s) ordered today. Your results will be released to [place] (or called to you) after review, usually within 72hours after test completion. If any changes need to be made, you will be notified at that same time.  All other Health Maintenance issues reviewed.   All recommended immunizations and age-appropriate screenings are up-to-date or discussed.  No immunizations administered today.   Medications reviewed and updated.  No changes recommended at this time.   Please followup in 6 months   Health Maintenance, Female Adopting a healthy lifestyle and getting preventive care can go a long way to promote health and wellness. Talk with your health care provider about what schedule of regular examinations is right for you. This is a good chance for you to check in with your provider about disease prevention and staying healthy. In between checkups, there are plenty of things you can do on your own. Experts have done a lot of research about which lifestyle changes and preventive measures are most likely to keep you healthy. Ask your health care provider for more information. Weight and diet Eat a healthy diet  Be sure to include plenty of vegetables, fruits, low-fat dairy products, and lean protein.  Do not eat a lot of foods high in solid fats, added sugars, or salt.  Get regular exercise. This is one of the most important things you can do for your health. ? Most adults should exercise for at least 150 minutes each week. The exercise should increase your heart rate and make you sweat (moderate-intensity exercise). ? Most adults should also do strengthening exercises at least twice a week. This is in addition to the moderate-intensity exercise.  Maintain a healthy weight  Body mass index (BMI) is a measurement that can be used to identify possible weight problems. It estimates body fat based on height and weight. Your health care provider can help determine your BMI and help you achieve or  maintain a healthy weight.  For females 14 years of age and older: ? A BMI below 18.5 is considered underweight. ? A BMI of 18.5 to 24.9 is normal. ? A BMI of 25 to 29.9 is considered overweight. ? A BMI of 30 and above is considered obese.  Watch levels of cholesterol and blood lipids  You should start having your blood tested for lipids and cholesterol at 30 years of age, then have this test every 5 years.  You may need to have your cholesterol levels checked more often if: ? Your lipid or cholesterol levels are high. ? You are older than 29 years of age. ? You are at high risk for heart disease.  Cancer screening Lung Cancer  Lung cancer screening is recommended for adults 32-27 years old who are at high risk for lung cancer because of a history of smoking.  A yearly low-dose CT scan of the lungs is recommended for people who: ? Currently smoke. ? Have quit within the past 15 years. ? Have at least a 30-pack-year history of smoking. A pack year is smoking an average of one pack of cigarettes a day for 1 year.  Yearly screening should continue until it has been 15 years since you quit.  Yearly screening should stop if you develop a health problem that would prevent you from having lung cancer treatment.  Breast Cancer  Practice breast self-awareness. This means understanding how your breasts normally appear and feel.  It also means doing regular breast self-exams. Let your health care provider know about any changes,  no matter how small.  If you are in your 20s or 30s, you should have a clinical breast exam (CBE) by a health care provider every 1-3 years as part of a regular health exam.  If you are 40 or older, have a CBE every year. Also consider having a breast X-ray (mammogram) every year.  If you have a family history of breast cancer, talk to your health care provider about genetic screening.  If you are at high risk for breast cancer, talk to your health care  provider about having an MRI and a mammogram every year.  Breast cancer gene (BRCA) assessment is recommended for women who have family members with BRCA-related cancers. BRCA-related cancers include: ? Breast. ? Ovarian. ? Tubal. ? Peritoneal cancers.  Results of the assessment will determine the need for genetic counseling and BRCA1 and BRCA2 testing.  Cervical Cancer Your health care provider may recommend that you be screened regularly for cancer of the pelvic organs (ovaries, uterus, and vagina). This screening involves a pelvic examination, including checking for microscopic changes to the surface of your cervix (Pap test). You may be encouraged to have this screening done every 3 years, beginning at age 21.  For women ages 30-65, health care providers may recommend pelvic exams and Pap testing every 3 years, or they may recommend the Pap and pelvic exam, combined with testing for human papilloma virus (HPV), every 5 years. Some types of HPV increase your risk of cervical cancer. Testing for HPV may also be done on women of any age with unclear Pap test results.  Other health care providers may not recommend any screening for nonpregnant women who are considered low risk for pelvic cancer and who do not have symptoms. Ask your health care provider if a screening pelvic exam is right for you.  If you have had past treatment for cervical cancer or a condition that could lead to cancer, you need Pap tests and screening for cancer for at least 20 years after your treatment. If Pap tests have been discontinued, your risk factors (such as having a new sexual partner) need to be reassessed to determine if screening should resume. Some women have medical problems that increase the chance of getting cervical cancer. In these cases, your health care provider may recommend more frequent screening and Pap tests.  Colorectal Cancer  This type of cancer can be detected and often prevented.  Routine  colorectal cancer screening usually begins at 29 years of age and continues through 29 years of age.  Your health care provider may recommend screening at an earlier age if you have risk factors for colon cancer.  Your health care provider may also recommend using home test kits to check for hidden blood in the stool.  A small camera at the end of a tube can be used to examine your colon directly (sigmoidoscopy or colonoscopy). This is done to check for the earliest forms of colorectal cancer.  Routine screening usually begins at age 50.  Direct examination of the colon should be repeated every 5-10 years through 29 years of age. However, you may need to be screened more often if early forms of precancerous polyps or small growths are found.  Skin Cancer  Check your skin from head to toe regularly.  Tell your health care provider about any new moles or changes in moles, especially if there is a change in a mole's shape or color.  Also tell your health care provider if you   have a mole that is larger than the size of a pencil eraser.  Always use sunscreen. Apply sunscreen liberally and repeatedly throughout the day.  Protect yourself by wearing long sleeves, pants, a wide-brimmed hat, and sunglasses whenever you are outside.  Heart disease, diabetes, and high blood pressure  High blood pressure causes heart disease and increases the risk of stroke. High blood pressure is more likely to develop in: ? People who have blood pressure in the high end of the normal range (130-139/85-89 mm Hg). ? People who are overweight or obese. ? People who are African American.  If you are 81-62 years of age, have your blood pressure checked every 3-5 years. If you are 90 years of age or older, have your blood pressure checked every year. You should have your blood pressure measured twice-once when you are at a hospital or clinic, and once when you are not at a hospital or clinic. Record the average of the  two measurements. To check your blood pressure when you are not at a hospital or clinic, you can use: ? An automated blood pressure machine at a pharmacy. ? A home blood pressure monitor.  If you are between 37 years and 41 years old, ask your health care provider if you should take aspirin to prevent strokes.  Have regular diabetes screenings. This involves taking a blood sample to check your fasting blood sugar level. ? If you are at a normal weight and have a low risk for diabetes, have this test once every three years after 29 years of age. ? If you are overweight and have a high risk for diabetes, consider being tested at a younger age or more often. Preventing infection Hepatitis B  If you have a higher risk for hepatitis B, you should be screened for this virus. You are considered at high risk for hepatitis B if: ? You were born in a country where hepatitis B is common. Ask your health care provider which countries are considered high risk. ? Your parents were born in a high-risk country, and you have not been immunized against hepatitis B (hepatitis B vaccine). ? You have HIV or AIDS. ? You use needles to inject street drugs. ? You live with someone who has hepatitis B. ? You have had sex with someone who has hepatitis B. ? You get hemodialysis treatment. ? You take certain medicines for conditions, including cancer, organ transplantation, and autoimmune conditions.  Hepatitis C  Blood testing is recommended for: ? Everyone born from [birth year] through 1963. ? Anyone with known risk factors for hepatitis C.  Sexually transmitted infections (STIs)  You should be screened for sexually transmitted infections (STIs) including gonorrhea and chlamydia if: ? You are sexually active and are younger than 29 years of age. ? You are older than 29 years of age and your health care provider tells you that you are at risk for this type of infection. ? Your sexual activity has changed since you  were last screened and you are at an increased risk for chlamydia or gonorrhea. Ask your health care provider if you are at risk.  If you do not have HIV, but are at risk, it may be recommended that you take a prescription medicine daily to prevent HIV infection. This is called pre-exposure prophylaxis (PrEP). You are considered at risk if: ? You are sexually active and do not regularly use condoms or know the HIV status of your partner(s). ? You take drugs by injection. ?  You are sexually active with a partner who has HIV.  Talk with your health care provider about whether you are at high risk of being infected with HIV. If you choose to begin PrEP, you should first be tested for HIV. You should then be tested every 3 months for as long as you are taking PrEP. Pregnancy  If you are premenopausal and you may become pregnant, ask your health care provider about preconception counseling.  If you may become pregnant, take 400 to 800 micrograms (mcg) of folic acid every day.  If you want to prevent pregnancy, talk to your health care provider about birth control (contraception). Osteoporosis and menopause  Osteoporosis is a disease in which the bones lose minerals and strength with aging. This can result in serious bone fractures. Your risk for osteoporosis can be identified using a bone density scan.  If you are 65 years of age or older, or if you are at risk for osteoporosis and fractures, ask your health care provider if you should be screened.  Ask your health care provider whether you should take a calcium or vitamin D supplement to lower your risk for osteoporosis.  Menopause may have certain physical symptoms and risks.  Hormone replacement therapy may reduce some of these symptoms and risks. Talk to your health care provider about whether hormone replacement therapy is right for you. Follow these instructions at home:  Schedule regular health, dental, and eye exams.  Stay current  with your immunizations.  Do not use any tobacco products including cigarettes, chewing tobacco, or electronic cigarettes.  If you are pregnant, do not drink alcohol.  If you are breastfeeding, limit how much and how often you drink alcohol.  Limit alcohol intake to no more than 1 drink per day for nonpregnant women. One drink equals 12 ounces of beer, 5 ounces of wine, or 1 ounces of hard liquor.  Do not use street drugs.  Do not share needles.  Ask your health care provider for help if you need support or information about quitting drugs.  Tell your health care provider if you often feel depressed.  Tell your health care provider if you have ever been abused or do not feel safe at home. This information is not intended to replace advice given to you by your health care provider. Make sure you discuss any questions you have with your health care provider. Document Released: 02/09/2011 Document Revised: 01/02/2016 Document Reviewed: 04/30/2015 Elsevier Interactive Patient Education  2018 [employer] Inc[employer]  

## 2017-02-01 ENCOUNTER — Encounter: Payer: Self-pay | Admitting: Internal Medicine

## 2017-02-01 ENCOUNTER — Ambulatory Visit (INDEPENDENT_AMBULATORY_CARE_PROVIDER_SITE_OTHER): Payer: 59 | Admitting: Internal Medicine

## 2017-02-01 ENCOUNTER — Other Ambulatory Visit (INDEPENDENT_AMBULATORY_CARE_PROVIDER_SITE_OTHER): Payer: 59

## 2017-02-01 VITALS — BP 124/84 | HR 76 | Temp 98.6°F | Resp 16 | Ht 64.0 in | Wt 243.0 lb

## 2017-02-01 DIAGNOSIS — R7303 Prediabetes: Secondary | ICD-10-CM

## 2017-02-01 DIAGNOSIS — J302 Other seasonal allergic rhinitis: Secondary | ICD-10-CM | POA: Diagnosis not present

## 2017-02-01 DIAGNOSIS — J45998 Other asthma: Secondary | ICD-10-CM | POA: Diagnosis not present

## 2017-02-01 DIAGNOSIS — Z0000 Encounter for general adult medical examination without abnormal findings: Secondary | ICD-10-CM

## 2017-02-01 DIAGNOSIS — J309 Allergic rhinitis, unspecified: Secondary | ICD-10-CM | POA: Insufficient documentation

## 2017-02-01 DIAGNOSIS — J301 Allergic rhinitis due to pollen: Secondary | ICD-10-CM | POA: Diagnosis not present

## 2017-02-01 DIAGNOSIS — A6000 Herpesviral infection of urogenital system, unspecified: Secondary | ICD-10-CM | POA: Insufficient documentation

## 2017-02-01 LAB — HEMOGLOBIN A1C: HEMOGLOBIN A1C: 6 % (ref 4.6–6.5)

## 2017-02-01 LAB — COMPREHENSIVE METABOLIC PANEL
ALBUMIN: 4 g/dL (ref 3.5–5.2)
ALT: 18 U/L (ref 0–35)
AST: 12 U/L (ref 0–37)
Alkaline Phosphatase: 32 U/L — ABNORMAL LOW (ref 39–117)
BUN: 18 mg/dL (ref 6–23)
CO2: 28 mEq/L (ref 19–32)
Calcium: 9.1 mg/dL (ref 8.4–10.5)
Chloride: 105 mEq/L (ref 96–112)
Creatinine, Ser: 0.87 mg/dL (ref 0.40–1.20)
GFR: 99.22 mL/min (ref >60.00)
Glucose, Bld: 95 mg/dL (ref 70–99)
POTASSIUM: 4 meq/L (ref 3.5–5.1)
SODIUM: 139 meq/L (ref 135–145)
Total Bilirubin: 0.3 mg/dL (ref 0.2–1.2)
Total Protein: 6.9 g/dL (ref 6.0–8.3)

## 2017-02-01 LAB — CBC WITH DIFFERENTIAL/PLATELET
BASOS PCT: 0.5 % (ref 0.0–3.0)
Basophils Absolute: 0 10*3/uL (ref 0.0–0.1)
EOS PCT: 5.1 % — AB (ref 0.0–5.0)
Eosinophils Absolute: 0.2 10*3/uL (ref 0.0–0.7)
HCT: 38.8 % (ref 36.0–46.0)
Hemoglobin: 12.9 g/dL (ref 12.0–15.0)
LYMPHS PCT: 53 % — AB (ref 12.0–46.0)
Lymphs Abs: 2 10*3/uL (ref 0.7–4.0)
MCHC: 33.2 g/dL (ref 30.0–36.0)
MCV: 83.6 fl (ref 78.0–100.0)
Monocytes Absolute: 0.3 10*3/uL (ref 0.1–1.0)
Monocytes Relative: 7.5 % (ref 3.0–12.0)
Neutro Abs: 1.3 10*3/uL — ABNORMAL LOW (ref 1.4–7.7)
Neutrophils Relative %: 33.9 % — ABNORMAL LOW (ref 43.0–77.0)
Platelets: 218 10*3/uL (ref 150.0–400.0)
RBC: 4.64 Mil/uL (ref 3.87–5.11)
RDW: 14 % (ref 11.5–15.5)
WBC: 3.8 10*3/uL — AB (ref 4.0–10.5)

## 2017-02-01 LAB — LIPID PANEL
CHOL/HDL RATIO: 4
Cholesterol: 131 mg/dL (ref 0–200)
HDL: 36 mg/dL — ABNORMAL LOW (ref >39.00)
LDL CALC: 82 mg/dL (ref 0–99)
NonHDL: 94.67
Triglycerides: 62 mg/dL (ref 0.0–149.0)
VLDL: 12.4 mg/dL (ref 0.0–40.0)

## 2017-02-01 LAB — TSH: TSH: 0.59 u[IU]/mL (ref 0.35–4.50)

## 2017-02-01 MED ORDER — ALBUTEROL SULFATE HFA 108 (90 BASE) MCG/ACT IN AERS: 2.0000 | INHALATION_SPRAY | Freq: Four times a day (QID) | RESPIRATORY_TRACT | 2 refills | Status: DC | PRN

## 2017-02-01 MED ORDER — MONTELUKAST SODIUM 10 MG PO TABS: 10.0000 mg | ORAL_TABLET | Freq: Every day | ORAL | 1 refills | Status: DC

## 2017-02-01 NOTE — Assessment & Plan Note (Signed)
Uses inhaler rarely with allergies Will refill inhaler

## 2017-02-01 NOTE — Assessment & Plan Note (Signed)
Seasonal Zyrtec, singular

## 2017-02-01 NOTE — Assessment & Plan Note (Signed)
Last a1c 5.7% Check a1c She understands risk of developing diabetes Low sugar / carb diet Stressed regular exercise,  weight loss

## 2017-02-01 NOTE — Assessment & Plan Note (Addendum)
Has prediabetes, h/o gestational diabetes Not currently exercising Poor diet Wants to lose weight Start exercise 5 days a week for 30 minutes and increase if possible Decreased portions, limiting carbs, more veges/fruits/lean protein F/u in 6 months

## 2017-03-03 DIAGNOSIS — R102 Pelvic and perineal pain: Secondary | ICD-10-CM | POA: Diagnosis not present

## 2017-03-23 ENCOUNTER — Encounter: Payer: Self-pay | Admitting: Internal Medicine

## 2017-08-02 ENCOUNTER — Ambulatory Visit: Payer: 59 | Admitting: Internal Medicine

## 2017-08-14 NOTE — Progress Notes (Deleted)
Subjective:    Patient ID: LISHA VITALE, female    DOB: 1985-10-24, 32 y.o.   MRN: [id number]  HPI The patient is here for follow up.  Prediabetes:  She is compliant with a low sugar/carbohydrate diet.  She is exercising regularly.  Seasonal asthma:    Medications and allergies reviewed with patient and updated if appropriate.  Patient Active Problem List   Diagnosis Date Noted  . Allergic rhinitis 02/01/2017  . Genital herpes simplex 02/01/2017  . Prediabetes 11/24/2016  . Vitamin D deficiency 11/24/2016  . S/P primary low transverse C-section 02/28/2016  . Seasonal asthma 08/06/2011  . Morbid obesity ([place]) 08/06/2011  . Fibroids 08/06/2011    Current Outpatient Medications on File Prior to Visit  Medication Sig Dispense Refill  . acyclovir cream (ZOVIRAX) 5 % Apply 1 application topically as needed (outbreaks).    Marland Kitchen albuterol (PROVENTIL HFA;VENTOLIN HFA) 108 (90 Base) MCG/ACT inhaler Inhale 2 puffs into the lungs every 6 (six) hours as needed for wheezing or shortness of breath. 1 Inhaler 2  . cetirizine (ZYRTEC) 10 MG tablet Take 1 tablet (10 mg total) by mouth daily. 30 tablet 0  . fluticasone (FLONASE) 50 MCG/ACT nasal spray Place 2 sprays into both nostrils daily. 16 g 1  . ibuprofen (ADVIL,MOTRIN) 600 MG tablet Take 1 tablet (600 mg total) by mouth every 6 (six) hours. 60 tablet 2  . montelukast (SINGULAIR) 10 MG tablet Take 1 tablet (10 mg total) by mouth at bedtime. 90 tablet 1  . Prenatal Vit-Fe Fumarate-FA (PRENATAL MULTIVITAMIN) TABS tablet Take 1 tablet by mouth daily at 12 noon.    . sodium chloride (OCEAN) 0.65 % SOLN nasal spray Place 1 spray into both nostrils as needed for congestion. 15 mL 0  . valACYclovir (VALTREX) 500 MG tablet Take 500 mg by mouth 1 day or 1 dose. Takes with breakouts prn     No current facility-administered medications on file prior to visit.     Past Medical History:  Diagnosis Date  . Abnormal Pap smear 2007   [facility]  .  Asthma    childhood  . Fibroid   . Gestational diabetes    first pregnancy, diet controlled  . H/O chlamydia infection 2010  . H/O varicella   . Mass of neck 03/02/2014  . Obese   . Rubella non-immune status 01/11/2012  . Vaginal Pap smear, abnormal     Past Surgical History:  Procedure Laterality Date  . CESAREAN SECTION N/A 02/28/2016   Procedure: CESAREAN SECTION;  Surgeon: [name], MD;  Location: [facility];  Service: Obstetrics;  Laterality: N/A;  . NO PAST SURGERIES      Social History   Socioeconomic History  . Marital status: Single    Spouse name: Not on file  . Number of children: Not on file  . Years of education: Not on file  . Highest education level: Not on file  Social Needs  . Financial resource strain: Not on file  . Food insecurity - worry: Not on file  . Food insecurity - inability: Not on file  . Transportation needs - medical: Not on file  . Transportation needs - non-medical: Not on file  Occupational History  . Not on file  Tobacco Use  . Smoking status: Never Smoker  . Smokeless tobacco: Never Used  Substance and Sexual Activity  . Alcohol use: Yes    Comment: occasional  . Drug use: No  . Sexual activity: Yes  Birth control/protection: Condom  Other Topics Concern  . Not on file  Social History Narrative   Engaged, 38 year old daughter    Family History  Problem Relation Age of Onset  . Hypertension Mother        diet controlled no meds  . Hypertension Father   . Alcohol abuse Father   . Heart disease Maternal Aunt   . Mental illness Maternal Aunt        bipolar  . Diabetes Maternal Aunt   . Heart disease Maternal Grandmother   . Hypertension Maternal Grandmother   . Thrombophlebitis Maternal Grandmother   . Diabetes Maternal Grandmother   . Stroke Maternal Grandmother   . Hypertension Paternal Grandmother   . Heart disease Paternal Grandmother   . Asthma Cousin   . Cancer Cousin   . Heart disease Cousin         congenital heart disease  . Learning disabilities Cousin   . Anesthesia problems Neg Hx     Review of Systems     Objective:  There were no vitals filed for this visit. Wt Readings from Last 3 Encounters:  02/01/17 243 lb (110.2 kg)  12/01/16 243 lb (110.2 kg)  02/28/16 261 lb (118.4 kg)   There is no height or weight on file to calculate BMI.   Physical Exam    Constitutional: Appears well-developed and well-nourished. No distress.  HENT:  Head: Normocephalic and atraumatic.  Neck: Neck supple. No tracheal deviation present. No thyromegaly present.  No cervical lymphadenopathy Cardiovascular: Normal rate, regular rhythm and normal heart sounds.   No murmur heard. No carotid bruit .  No edema Pulmonary/Chest: Effort normal and breath sounds normal. No respiratory distress. No has no wheezes. No rales.  Skin: Skin is warm and dry. Not diaphoretic.  Psychiatric: Normal mood and affect. Behavior is normal.      Assessment & Plan:    See Problem List for Assessment and Plan of chronic medical problems.

## 2017-08-16 ENCOUNTER — Ambulatory Visit: Payer: 59 | Admitting: Internal Medicine

## 2017-08-23 NOTE — Progress Notes (Signed)
Subjective:    Patient ID: Brenda [name], female    DOB: 1985/08/19, 32 y.o.   MRN: [id number]  HPI The patient is here for follow up.  Prediabetes:  She is compliant with a low sugar/carbohydrate diet.  She is exercising regularly.  Seasonal asthma, allergies:  She is taking the Singulair daily.  She uses her albuterol inhaler as needed.  She is taking her allergy medication daily and her allergy symptoms are well controlled.   Obesity:    Medications and allergies reviewed with patient and updated if appropriate.  Patient Active Problem List   Diagnosis Date Noted  . Allergic rhinitis 02/01/2017  . Genital herpes simplex 02/01/2017  . Prediabetes 11/24/2016  . Vitamin D deficiency 11/24/2016  . S/P primary low transverse C-section 02/28/2016  . Seasonal asthma 08/06/2011  . Morbid obesity ([place]) 08/06/2011  . Fibroids 08/06/2011    Current Outpatient Medications on File Prior to Visit  Medication Sig Dispense Refill  . acyclovir cream (ZOVIRAX) 5 % Apply 1 application topically as needed (outbreaks).    Marland Kitchen albuterol (PROVENTIL HFA;VENTOLIN HFA) 108 (90 Base) MCG/ACT inhaler Inhale 2 puffs into the lungs every 6 (six) hours as needed for wheezing or shortness of breath. 1 Inhaler 2  . cetirizine (ZYRTEC) 10 MG tablet Take 1 tablet (10 mg total) by mouth daily. 30 tablet 0  . fluticasone (FLONASE) 50 MCG/ACT nasal spray Place 2 sprays into both nostrils daily. 16 g 1  . ibuprofen (ADVIL,MOTRIN) 600 MG tablet Take 1 tablet (600 mg total) by mouth every 6 (six) hours. 60 tablet 2  . montelukast (SINGULAIR) 10 MG tablet Take 1 tablet (10 mg total) by mouth at bedtime. 90 tablet 1  . Prenatal Vit-Fe Fumarate-FA (PRENATAL MULTIVITAMIN) TABS tablet Take 1 tablet by mouth daily at 12 noon.    . sodium chloride (OCEAN) 0.65 % SOLN nasal spray Place 1 spray into both nostrils as needed for congestion. 15 mL 0  . valACYclovir (VALTREX) 500 MG tablet Take 500 mg by mouth 1 day or 1  dose. Takes with breakouts prn     No current facility-administered medications on file prior to visit.     Past Medical History:  Diagnosis Date  . Abnormal Pap smear 2007   colpo  . Asthma    childhood  . Fibroid   . Gestational diabetes    first pregnancy, diet controlled  . H/O chlamydia infection 2010  . H/O varicella   . Mass of neck 03/02/2014  . Obese   . Rubella non-immune status 01/11/2012  . Vaginal Pap smear, abnormal     Past Surgical History:  Procedure Laterality Date  . CESAREAN SECTION N/A 02/28/2016   Procedure: CESAREAN SECTION;  Surgeon: [name], MD;  Location: [facility];  Service: Obstetrics;  Laterality: N/A;  . NO PAST SURGERIES      Social History   Socioeconomic History  . Marital status: Single    Spouse name: Not on file  . Number of children: Not on file  . Years of education: Not on file  . Highest education level: Not on file  Social Needs  . Financial resource strain: Not on file  . Food insecurity - worry: Not on file  . Food insecurity - inability: Not on file  . Transportation needs - medical: Not on file  . Transportation needs - non-medical: Not on file  Occupational History  . Not on file  Tobacco Use  . Smoking status:  Never Smoker  . Smokeless tobacco: Never Used  Substance and Sexual Activity  . Alcohol use: Yes    Comment: occasional  . Drug use: No  . Sexual activity: Yes    Birth control/protection: Condom  Other Topics Concern  . Not on file  Social History Narrative   Engaged, 44 year old daughter    Family History  Problem Relation Age of Onset  . Hypertension Mother        diet controlled no meds  . Hypertension Father   . Alcohol abuse Father   . Heart disease Maternal Aunt   . Mental illness Maternal Aunt        bipolar  . Diabetes Maternal Aunt   . Heart disease Maternal Grandmother   . Hypertension Maternal Grandmother   . Thrombophlebitis Maternal Grandmother   . Diabetes Maternal  Grandmother   . Stroke Maternal Grandmother   . Hypertension Paternal Grandmother   . Heart disease Paternal Grandmother   . Asthma Cousin   . Cancer Cousin   . Heart disease Cousin        congenital heart disease  . Learning disabilities Cousin   . Anesthesia problems Neg Hx     Review of Systems     Objective:  There were no vitals filed for this visit. Wt Readings from Last 3 Encounters:  02/01/17 243 lb (110.2 kg)  12/01/16 243 lb (110.2 kg)  02/28/16 261 lb (118.4 kg)   There is no height or weight on file to calculate BMI.   Physical Exam    Constitutional: Appears well-developed and well-nourished. No distress.  HENT:  Head: Normocephalic and atraumatic.  Neck: Neck supple. No tracheal deviation present. No thyromegaly present.  No cervical lymphadenopathy Cardiovascular: Normal rate, regular rhythm and normal heart sounds.   No murmur heard. No carotid bruit .  No edema Pulmonary/Chest: Effort normal and breath sounds normal. No respiratory distress. No has no wheezes. No rales.  Skin: Skin is warm and dry. Not diaphoretic.  Psychiatric: Normal mood and affect. Behavior is normal.      Assessment & Plan:    See Problem List for Assessment and Plan of chronic medical problems.   This encounter was created in error - please disregard.

## 2017-08-23 NOTE — Patient Instructions (Addendum)
  Test(s) ordered today. Your results will be released to MyChart (or called to you) after review, usually within 72hours after test completion. If any changes need to be made, you will be notified at that same time.  All other Health Maintenance issues reviewed.   All recommended immunizations and age-appropriate screenings are up-to-date or discussed.  No immunizations administered today.   Medications reviewed and updated.  Changes include  /  No changes recommended at this time.  Your prescription(s) have been submitted to your pharmacy. Please take as directed and contact our office if you believe you are having problem(s) with the medication(s).  A referral was ordered for   Please followup in 6 months   

## 2017-08-24 ENCOUNTER — Encounter: Payer: 59 | Admitting: Internal Medicine

## 2017-09-07 ENCOUNTER — Ambulatory Visit (HOSPITAL_COMMUNITY)
Admission: EM | Admit: 2017-09-07 | Discharge: 2017-09-07 | Disposition: A | Discharge status: Home / Self Care | Payer: 59 | Attending: Family Medicine | Admitting: Family Medicine

## 2017-09-07 ENCOUNTER — Encounter (HOSPITAL_COMMUNITY): Payer: Self-pay | Admitting: Emergency Medicine

## 2017-09-07 DIAGNOSIS — M545 Low back pain, unspecified: Secondary | ICD-10-CM

## 2017-09-07 DIAGNOSIS — Z3202 Encounter for pregnancy test, result negative: Secondary | ICD-10-CM | POA: Diagnosis not present

## 2017-09-07 LAB — POCT URINALYSIS DIP (DEVICE)
BILIRUBIN URINE: NEGATIVE
Glucose, UA: NEGATIVE mg/dL
HGB URINE DIPSTICK: NEGATIVE
Ketones, ur: NEGATIVE mg/dL
LEUKOCYTES UA: NEGATIVE
Nitrite: NEGATIVE
Protein, ur: NEGATIVE mg/dL
Specific Gravity, Urine: 1.02 (ref 1.005–1.030)
Urobilinogen, UA: 0.2 mg/dL (ref 0.0–1.0)
pH: 7.5 (ref 5.0–8.0)

## 2017-09-07 NOTE — Discharge Instructions (Addendum)
Urine today looks normal-   This back pain appears to be more muscular related.  Please continue ibuprofen up to 800 mg with food every 8 hours, may alternate with Tylenol every 4 hours.  Please also try alternating ice and heat to your back.  If pain is persisting beyond 1-2 weeks, please follow-up with primary care.   Please go to emergency room if you develop worsening abdominal pain, develop nausea and vomiting, fevers, correlation of pain with eating.

## 2017-09-07 NOTE — ED Provider Notes (Signed)
[facility]    CSN: [id number] Arrival date & time: 09/07/17  [place]     History   Chief Complaint Chief Complaint  Patient presents with  . Back Pain    HPI Brenda [name] is a 32 y.o. female past medical history presenting today with right-sided lower back pain.  States that the pain has been there for 4 days.  And has noticed that wrapping around to her lower abdomen.  She denies any associated nausea, vomiting, diarrhea.  Pain increases with moving, bending, walking.  She notices a locking up sensation with these movements.  She denies pain at rest or any association with eating.  She denies any specific injury but does note that she moved to [place] bed the day prior to the onset of symptoms.  Last menstrual period was last ended last Friday is currently on pills for birth control.  Concerned about kidney infection.  Denies symptoms of dysuria, frequency, urgency, incomplete voiding.  Currently taking ibuprofen for the pain which does provide moderate relief.  HPI  Past Medical History:  Diagnosis Date  . Abnormal Pap smear 2007   colpo  . Asthma    childhood  . Fibroid   . Gestational diabetes    first pregnancy, diet controlled  . H/O chlamydia infection 2010  . H/O varicella   . Mass of neck 03/02/2014  . Obese   . Rubella non-immune status 01/11/2012  . Vaginal Pap smear, abnormal     Patient Active Problem List   Diagnosis Date Noted  . Allergic rhinitis 02/01/2017  . Genital herpes simplex 02/01/2017  . Prediabetes 11/24/2016  . Vitamin D deficiency 11/24/2016  . S/P primary low transverse C-section 02/28/2016  . Seasonal asthma 08/06/2011  . Morbid obesity ([place]) 08/06/2011  . Fibroids 08/06/2011    Past Surgical History:  Procedure Laterality Date  . CESAREAN SECTION N/A 02/28/2016   Procedure: CESAREAN SECTION;  Surgeon: [name], MD;  Location: [facility];  Service: Obstetrics;  Laterality: N/A;  . NO PAST SURGERIES      OB  History    Gravida Para Term Preterm AB Living   2 2 2  0 0 2   SAB TAB Ectopic Multiple Live Births   0 0 0 0 2       Home Medications    Prior to Admission medications   Medication Sig Start Date End Date Taking? Authorizing Provider  albuterol (PROVENTIL HFA;VENTOLIN HFA) 108 (90 Base) MCG/ACT inhaler Inhale 2 puffs into the lungs every 6 (six) hours as needed for wheezing or shortness of breath. 02/01/17  Yes Burns, [name], MD  cetirizine (ZYRTEC) 10 MG tablet Take 1 tablet (10 mg total) by mouth daily. 12/01/16  Yes Nche, Charlene Brooke, NP  fluticasone (FLONASE) 50 MCG/ACT nasal spray Place 2 sprays into both nostrils daily. 12/01/16  Yes Nche, Charlene Brooke, NP  montelukast (SINGULAIR) 10 MG tablet Take 1 tablet (10 mg total) by mouth at bedtime. 02/01/17  Yes Burns, [name], MD  valACYclovir (VALTREX) 500 MG tablet Take 500 mg by mouth 1 day or 1 dose. Takes with breakouts prn   Yes [provider]  acyclovir cream (ZOVIRAX) 5 % Apply 1 application topically as needed (outbreaks).    [provider]  ibuprofen (ADVIL,MOTRIN) 600 MG tablet Take 1 tablet (600 mg total) by mouth every 6 (six) hours. 03/01/16   Farrel Gordon, CNM  Prenatal Vit-Fe Fumarate-FA (PRENATAL MULTIVITAMIN) TABS tablet Take 1 tablet by mouth  daily at 12 noon.    [provider]  sodium chloride (OCEAN) 0.65 % SOLN nasal spray Place 1 spray into both nostrils as needed for congestion. 12/01/16   Nche, Charlene Brooke, NP    Family History Family History  Problem Relation Age of Onset  . Hypertension Mother        diet controlled no meds  . Hypertension Father   . Alcohol abuse Father   . Heart disease Maternal Aunt   . Mental illness Maternal Aunt        bipolar  . Diabetes Maternal Aunt   . Heart disease Maternal Grandmother   . Hypertension Maternal Grandmother   . Thrombophlebitis Maternal Grandmother   . Diabetes Maternal Grandmother   . Stroke Maternal Grandmother   .  Hypertension Paternal Grandmother   . Heart disease Paternal Grandmother   . Asthma Cousin   . Cancer Cousin   . Heart disease Cousin        congenital heart disease  . Learning disabilities Cousin   . Anesthesia problems Neg Hx     Social History Social History   Tobacco Use  . Smoking status: Never Smoker  . Smokeless tobacco: Never Used  Substance Use Topics  . Alcohol use: Yes    Comment: occasional  . Drug use: No     Allergies   Patient has no known allergies.   Review of Systems Review of Systems  Constitutional: Negative for fever.  Respiratory: Negative for shortness of breath.   Cardiovascular: Negative for chest pain.  Gastrointestinal: Negative for abdominal pain, diarrhea, nausea and vomiting.  Genitourinary: Negative for dysuria, flank pain, genital sores, hematuria, menstrual problem, vaginal bleeding, vaginal discharge and vaginal pain.  Musculoskeletal: Positive for back pain and myalgias. Negative for gait problem.  Skin: Negative for rash.  Neurological: Positive for weakness. Negative for dizziness, light-headedness and headaches.     Physical Exam Triage Vital Signs ED Triage Vitals  Enc Vitals Group     BP 09/07/17 1911 124/83     Pulse Rate 09/07/17 1911 88     Resp 09/07/17 1911 16     Temp 09/07/17 1911 98.5 F (36.9 C)     Temp Source 09/07/17 1911 Oral     SpO2 09/07/17 1911 100 %     Weight 09/07/17 1910 247 lb (112 kg)     Height 09/07/17 1910 5\' 4"  (1.626 m)     Head Circumference --      Peak Flow --      Pain Score 09/07/17 1910 7     Pain Loc --      Pain Edu? --      Excl. in [place]? --    No data found.  Updated Vital Signs BP 124/83   Pulse 88   Temp 98.5 F (36.9 C) (Oral)   Resp 16   Ht 5\' 4"  (1.626 m)   Wt 247 lb (112 kg)   LMP 08/30/2017   SpO2 100%   BMI 42.40 kg/m    Physical Exam  Constitutional: She is oriented to person, place, and time. She appears well-developed and well-nourished. No distress.    HENT:  Head: Normocephalic and atraumatic.  Eyes: Conjunctivae are normal.  Neck: Neck supple.  Cardiovascular: Normal rate and regular rhythm.  No murmur heard. Pulmonary/Chest: Effort normal and breath sounds normal. No respiratory distress.  To auscultation bilaterally, no adventitious sounds appreciated  Abdominal: Soft. There is tenderness.  Tender to light and deep  palpation on the right side of abdomen.  Abdomen is soft, patient is not guarding, negative rebound, negative McBurney's.  Negative CVA tenderness.  Musculoskeletal: She exhibits no edema.  Tenderness to palpation of lumbar spine, no focal tenderness.  Tenderness to palpation of lateral lumbar musculature.  Palpation does reproduce pain.  Pain elicited when patient is lying and turning on exam table.  Neurological: She is alert and oriented to person, place, and time.  Skin: Skin is warm and dry.  Psychiatric: She has a normal mood and affect.  Nursing note and vitals reviewed.    UC Treatments / Results  Labs (all labs ordered are listed, but only abnormal results are displayed) Labs Reviewed  POCT URINALYSIS DIP (DEVICE)    EKG  EKG Interpretation None       Radiology No results found.  Procedures Procedures (including critical care time)  Medications Ordered in UC Medications - No data to display   Initial Impression / Assessment and Plan / UC Course  I have reviewed the triage vital signs and the nursing notes.  Pertinent labs & imaging results that were available during my care of the patient were reviewed by me and considered in my medical decision making (see chart for details).     Patient with right-sided back, flank, abdominal pain.  UA negative for blood and infection.  Pain reproduced on exam with palpation of the back and pain worsens with movement.  Pain seems to be more so muscular.  Abdominal exam does not appear to be acute or peritoneal.  No associated fever, nausea, vomiting.   Advised patient to follow-up with primary care doctor and 1-2 weeks if pain persisting without improvement for further evaluation of possible gallbladder or other intra-abdominal pathology.  Advised to continue ibuprofen, may supplement with Tylenol.  Advised if her abdominal pain worsens at all or she develops any fever, nausea, vomiting, or associations with food to go to emergency room.  Discussed strict return precautions. Patient verbalized understanding and is agreeable with plan.   Final Clinical Impressions(s) / UC Diagnoses   Final diagnoses:  Acute right-sided low back pain without sciatica    ED Discharge Orders    None       Controlled Substance Prescriptions [place] Controlled Substance Registry consulted? Not Applicable   Shelton Silvas 09/07/17 2109    Janith Lima, PA-C 09/07/17 2110

## 2017-09-07 NOTE — ED Triage Notes (Signed)
PT reports back pain that radiates to lower abdomen. PT reports pressure in lower abdomen. Denies urinary problems.

## 2017-09-08 LAB — POCT PREGNANCY, URINE: Preg Test, Ur: NEGATIVE

## 2017-11-15 ENCOUNTER — Ambulatory Visit (INDEPENDENT_AMBULATORY_CARE_PROVIDER_SITE_OTHER): Payer: 59 | Admitting: Internal Medicine

## 2017-11-15 ENCOUNTER — Encounter: Payer: Self-pay | Admitting: Internal Medicine

## 2017-11-15 VITALS — BP 124/82 | HR 108 | Temp 98.1°F | Resp 16 | Wt 245.0 lb

## 2017-11-15 DIAGNOSIS — J029 Acute pharyngitis, unspecified: Secondary | ICD-10-CM | POA: Diagnosis not present

## 2017-11-15 DIAGNOSIS — J101 Influenza due to other identified influenza virus with other respiratory manifestations: Secondary | ICD-10-CM | POA: Insufficient documentation

## 2017-11-15 MED ORDER — LORATADINE 10 MG PO TABS: 10.0000 mg | ORAL_TABLET | Freq: Every day | ORAL | 11 refills | Status: DC

## 2017-11-15 NOTE — Assessment & Plan Note (Signed)
Rapid strep negative Viral in nature Discussed symptomatic treatment-Advil and Tylenol Increase rest and fluids Call if no improvement

## 2017-11-15 NOTE — Progress Notes (Signed)
Subjective:    Patient ID: Brenda [name], female    DOB: 03-12-1986, 32 y.o.   MRN: [id number]  HPI She is here for an acute visit for cold symptoms.  Her symptoms started three days ago.  Her daughter was recently sick as well.  She was tested for strep and it was negative.  She is experiencing subjective fevers-has felt warm, fatigue, sinus pressure, sore throat associated with difficulty swallowing and a mild cough.  She has not had any nasal congestion, ear pain, shortness of breath, wheezing and headaches or lightheadedness.  She has taken ibuprofen and her allergy medications.  She feels the sore throat has gotten better since yesterday.  She was concerned because of her difficulty swallowing, which is better.  Medications and allergies reviewed with patient and updated if appropriate.  Patient Active Problem List   Diagnosis Date Noted  . Allergic rhinitis 02/01/2017  . Genital herpes simplex 02/01/2017  . Prediabetes 11/24/2016  . Vitamin D deficiency 11/24/2016  . S/P primary low transverse C-section 02/28/2016  . Seasonal asthma 08/06/2011  . Morbid obesity ([place]) 08/06/2011  . Fibroids 08/06/2011    Current Outpatient Medications on File Prior to Visit  Medication Sig Dispense Refill  . acyclovir cream (ZOVIRAX) 5 % Apply 1 application topically as needed (outbreaks).    Marland Kitchen albuterol (PROVENTIL HFA;VENTOLIN HFA) 108 (90 Base) MCG/ACT inhaler Inhale 2 puffs into the lungs every 6 (six) hours as needed for wheezing or shortness of breath. 1 Inhaler 2  . cetirizine (ZYRTEC) 10 MG tablet Take 1 tablet (10 mg total) by mouth daily. 30 tablet 0  . fluticasone (FLONASE) 50 MCG/ACT nasal spray Place 2 sprays into both nostrils daily. 16 g 1  . ibuprofen (ADVIL,MOTRIN) 600 MG tablet Take 1 tablet (600 mg total) by mouth every 6 (six) hours. 60 tablet 2  . montelukast (SINGULAIR) 10 MG tablet Take 1 tablet (10 mg total) by mouth at bedtime. 90 tablet 1  . Prenatal Vit-Fe  Fumarate-FA (PRENATAL MULTIVITAMIN) TABS tablet Take 1 tablet by mouth daily at 12 noon.    . sodium chloride (OCEAN) 0.65 % SOLN nasal spray Place 1 spray into both nostrils as needed for congestion. 15 mL 0  . valACYclovir (VALTREX) 500 MG tablet Take 500 mg by mouth 1 day or 1 dose. Takes with breakouts prn     No current facility-administered medications on file prior to visit.     Past Medical History:  Diagnosis Date  . Abnormal Pap smear 2007   colpo  . Asthma    childhood  . Fibroid   . Gestational diabetes    first pregnancy, diet controlled  . H/O chlamydia infection 2010  . H/O varicella   . Mass of neck 03/02/2014  . Obese   . Rubella non-immune status 01/11/2012  . Vaginal Pap smear, abnormal     Past Surgical History:  Procedure Laterality Date  . CESAREAN SECTION N/A 02/28/2016   Procedure: CESAREAN SECTION;  Surgeon: [name], MD;  Location: [facility];  Service: Obstetrics;  Laterality: N/A;  . NO PAST SURGERIES      Social History   Socioeconomic History  . Marital status: Single    Spouse name: Not on file  . Number of children: Not on file  . Years of education: Not on file  . Highest education level: Not on file  Occupational History  . Not on file  Social Needs  . Financial resource strain: Not on  file  . Food insecurity:    Worry: Not on file    Inability: Not on file  . Transportation needs:    Medical: Not on file    Non-medical: Not on file  Tobacco Use  . Smoking status: Never Smoker  . Smokeless tobacco: Never Used  Substance and Sexual Activity  . Alcohol use: Yes    Comment: occasional  . Drug use: No  . Sexual activity: Yes    Birth control/protection: Condom  Lifestyle  . Physical activity:    Days per week: Not on file    Minutes per session: Not on file  . Stress: Not on file  Relationships  . Social connections:    Talks on phone: Not on file    Gets together: Not on file    Attends religious service: Not  on file    Active member of club or organization: Not on file    Attends meetings of clubs or organizations: Not on file    Relationship status: Not on file  Other Topics Concern  . Not on file  Social History Narrative   Engaged, 55 year old daughter    Family History  Problem Relation Age of Onset  . Hypertension Mother        diet controlled no meds  . Hypertension Father   . Alcohol abuse Father   . Heart disease Maternal Aunt   . Mental illness Maternal Aunt        bipolar  . Diabetes Maternal Aunt   . Heart disease Maternal Grandmother   . Hypertension Maternal Grandmother   . Thrombophlebitis Maternal Grandmother   . Diabetes Maternal Grandmother   . Stroke Maternal Grandmother   . Hypertension Paternal Grandmother   . Heart disease Paternal Grandmother   . Asthma Cousin   . Cancer Cousin   . Heart disease Cousin        congenital heart disease  . Learning disabilities Cousin   . Anesthesia problems Neg Hx     Review of Systems  Constitutional: Positive for fatigue and fever (subjective fever). Negative for chills.  HENT: Positive for sinus pressure (mild), sore throat and trouble swallowing. Negative for congestion and ear pain.   Respiratory: Positive for cough (from PND). Negative for shortness of breath and wheezing.   Endocrine: Negative for polydipsia and polyuria.  Neurological: Negative for dizziness, light-headedness and headaches.       Objective:   Vitals:   11/15/17 1551  BP: 124/82  Pulse: (!) 108  Resp: 16  Temp: 98.1 F (36.7 C)  SpO2: 97%   Filed Weights   11/15/17 1551  Weight: 245 lb (111.1 kg)   Body mass index is 42.05 kg/m.  Wt Readings from Last 3 Encounters:  11/15/17 245 lb (111.1 kg)  09/07/17 247 lb (112 kg)  02/01/17 243 lb (110.2 kg)     Physical Exam GENERAL APPEARANCE: Appears stated age, well appearing, NAD EYES: conjunctiva clear, no icterus HEENT: bilateral tympanic membranes and ear canals normal,  oropharynx with mild erythema, no thyromegaly, trachea midline, mild cervical lymphadenopathy LUNGS: Clear to auscultation without wheeze or crackles, unlabored breathing, good air entry bilaterally CARDIOVASCULAR: Normal S1,S2 without murmurs, no edema SKIN: warm, dry        Assessment & Plan:   See Problem List for Assessment and Plan of chronic medical problems.

## 2017-11-15 NOTE — Patient Instructions (Signed)

## 2017-11-24 ENCOUNTER — Ambulatory Visit (HOSPITAL_COMMUNITY)
Admission: EM | Admit: 2017-11-24 | Discharge: 2017-11-24 | Disposition: A | Discharge status: Home / Self Care | Payer: 59 | Attending: Family Medicine | Admitting: Family Medicine

## 2017-11-24 ENCOUNTER — Encounter (HOSPITAL_COMMUNITY): Payer: Self-pay | Admitting: Family Medicine

## 2017-11-24 DIAGNOSIS — H65191 Other acute nonsuppurative otitis media, right ear: Secondary | ICD-10-CM

## 2017-11-24 DIAGNOSIS — J069 Acute upper respiratory infection, unspecified: Secondary | ICD-10-CM

## 2017-11-24 DIAGNOSIS — H9201 Otalgia, right ear: Secondary | ICD-10-CM

## 2017-11-24 MED ORDER — AMOXICILLIN-POT CLAVULANATE 875-125 MG PO TABS: 1.0000 | ORAL_TABLET | Freq: Two times a day (BID) | ORAL | 0 refills | Status: AC

## 2017-11-24 NOTE — ED Provider Notes (Signed)
[facility]    CSN: [id number] Arrival date & time: 11/24/17  1906     History   Chief Complaint Chief Complaint  Patient presents with  . Otalgia    HPI Brenda [name] is a 32 y.o. female that complains of right ear pain three days.  She denies a precipitating event, but was diagnosed with pharyngitis last week.  She began taking some of her daughters antibiotic and her sore throat resolved. She has tried tylenol with temporary relief.  She states her symptoms are made worse with lying down on right side at night.  She does not report similar symptoms in the past.    HPI  Past Medical History:  Diagnosis Date  . Abnormal Pap smear 2007   colpo  . Asthma    childhood  . Fibroid   . Gestational diabetes    first pregnancy, diet controlled  . H/O chlamydia infection 2010  . H/O varicella   . Mass of neck 03/02/2014  . Obese   . Rubella non-immune status 01/11/2012  . Vaginal Pap smear, abnormal     Patient Active Problem List   Diagnosis Date Noted  . Acute pharyngitis 11/15/2017  . Allergic rhinitis 02/01/2017  . Genital herpes simplex 02/01/2017  . Prediabetes 11/24/2016  . Vitamin D deficiency 11/24/2016  . S/P primary low transverse C-section 02/28/2016  . Seasonal asthma 08/06/2011  . Morbid obesity ([place]) 08/06/2011  . Fibroids 08/06/2011    Past Surgical History:  Procedure Laterality Date  . CESAREAN SECTION N/A 02/28/2016   Procedure: CESAREAN SECTION;  Surgeon: [name], MD;  Location: [facility];  Service: Obstetrics;  Laterality: N/A;  . NO PAST SURGERIES      OB History    Gravida  2   Para  2   Term  2   Preterm  0   AB  0   Living  2     SAB  0   TAB  0   Ectopic  0   Multiple  0   Live Births  2            Home Medications    Prior to Admission medications   Medication Sig Start Date End Date Taking? Authorizing Provider  acyclovir cream (ZOVIRAX) 5 % Apply 1 application topically as needed  (outbreaks).    [provider]  albuterol (PROVENTIL HFA;VENTOLIN HFA) 108 (90 Base) MCG/ACT inhaler Inhale 2 puffs into the lungs every 6 (six) hours as needed for wheezing or shortness of breath. 02/01/17   Burns, [name], MD  fluticasone (FLONASE) 50 MCG/ACT nasal spray Place 2 sprays into both nostrils daily. 12/01/16   Nche, Charlene Brooke, NP  ibuprofen (ADVIL,MOTRIN) 600 MG tablet Take 1 tablet (600 mg total) by mouth every 6 (six) hours. 03/01/16   Farrel Gordon, CNM  loratadine (CLARITIN) 10 MG tablet Take 1 tablet (10 mg total) by mouth daily. 11/15/17   Burns, [name], MD  montelukast (SINGULAIR) 10 MG tablet Take 1 tablet (10 mg total) by mouth at bedtime. 02/01/17   Binnie Rail, MD  Prenatal Vit-Fe Fumarate-FA (PRENATAL MULTIVITAMIN) TABS tablet Take 1 tablet by mouth daily at 12 noon.    [provider]  sodium chloride (OCEAN) 0.65 % SOLN nasal spray Place 1 spray into both nostrils as needed for congestion. 12/01/16   Nche, Charlene Brooke, NP  valACYclovir (VALTREX) 500 MG tablet Take 500 mg by mouth 1 day or 1 dose. Takes  with breakouts prn    [provider]    Family History Family History  Problem Relation Age of Onset  . Hypertension Mother        diet controlled no meds  . Hypertension Father   . Alcohol abuse Father   . Heart disease Maternal Aunt   . Mental illness Maternal Aunt        bipolar  . Diabetes Maternal Aunt   . Heart disease Maternal Grandmother   . Hypertension Maternal Grandmother   . Thrombophlebitis Maternal Grandmother   . Diabetes Maternal Grandmother   . Stroke Maternal Grandmother   . Hypertension Paternal Grandmother   . Heart disease Paternal Grandmother   . Asthma Cousin   . Cancer Cousin   . Heart disease Cousin        congenital heart disease  . Learning disabilities Cousin   . Anesthesia problems Neg Hx     Social History Social History   Tobacco Use  . Smoking status: Never Smoker  . Smokeless  tobacco: Never Used  Substance Use Topics  . Alcohol use: Yes    Comment: occasional  . Drug use: No     Allergies   Patient has no known allergies.   Review of Systems Review of Systems  Constitutional: Negative for chills, fatigue and fever.  HENT: Positive for congestion, ear pain and postnasal drip. Negative for ear discharge, hearing loss, rhinorrhea, sinus pressure, sinus pain, sneezing, sore throat and trouble swallowing.   Respiratory: Negative for cough and shortness of breath.   Cardiovascular: Negative for chest pain.  Gastrointestinal: Negative for abdominal pain, constipation, diarrhea, nausea and vomiting.  Genitourinary: Negative for difficulty urinating and dysuria.     Physical Exam Triage Vital Signs ED Triage Vitals  Enc Vitals Group     BP 11/24/17 2008 134/76     Pulse Rate 11/24/17 2008 83     Resp 11/24/17 2008 18     Temp 11/24/17 2008 98.6 F (37 C)     Temp src --      SpO2 11/24/17 2008 98 %     Weight --      Height --      Head Circumference --      Peak Flow --      Pain Score 11/24/17 2007 3     Pain Loc --      Pain Edu? --      Excl. in [place]? --    No data found.  Updated Vital Signs BP 134/76   Pulse 83   Temp 98.6 F (37 C)   Resp 18   LMP 11/24/2017   SpO2 98%   Visual Acuity Right Eye Distance:   Left Eye Distance:   Bilateral Distance:    Right Eye Near:   Left Eye Near:    Bilateral Near:     Physical Exam  Constitutional: She is oriented to person, place, and time. She appears well-developed and well-nourished.  HENT:  Head: Normocephalic and atraumatic.  Left Ear: External ear normal.  Nose: Nose normal.  Mouth/Throat: Oropharynx is clear and moist. No oropharyngeal exudate.  RT TM erythematous with serous fluid  Eyes: Pupils are equal, round, and reactive to light. Conjunctivae and EOM are normal.  Neck: Normal range of motion. Neck supple.  Cardiovascular: Normal rate, regular rhythm and normal heart  sounds. Exam reveals no friction rub.  No murmur heard. Pulmonary/Chest: Effort normal and breath sounds normal. No stridor. No respiratory distress.  She has no wheezes. She has no rales.  Musculoskeletal:  Ambulates from chair to exam table without difficulty  Lymphadenopathy:    She has no cervical adenopathy.  Neurological: She is alert and oriented to person, place, and time.  Skin: Skin is warm and dry.  Psychiatric: She has a normal mood and affect. Her behavior is normal. Judgment and thought content normal.     UC Treatments / Results  Labs (all labs ordered are listed, but only abnormal results are displayed) Labs Reviewed - No data to display  EKG None Radiology No results found.  Procedures Procedures (including critical care time)  Medications Ordered in UC Medications - No data to display   Initial Impression / Assessment and Plan / UC Course  I have reviewed the triage vital signs and the nursing notes.  Pertinent labs & imaging results that were available during my care of the patient were reviewed by me and considered in my medical decision making (see chart for details).     Patient presents with three days hx of ear pain, and previous dx of pharyngitis.  Symptoms and exam findings consistent with OM.  Will start on augmentin.  Follow up with PCP.  Return precautions given.    Final Clinical Impressions(s) / UC Diagnoses   Final diagnoses:  None    ED Discharge Orders    None       Controlled Substance Prescriptions [place] Controlled Substance Registry consulted? Not Applicable   Lestine Box, [place] 11/24/17 2122

## 2017-11-24 NOTE — Discharge Instructions (Addendum)
Get plenty of rest and push fluids Use OTC medication as needed for symptomatic relief Take antibiotic as directed and to completion Follow up with PCP if symptoms persist Return or go to ER if you have any new or worsening symptoms

## 2017-11-24 NOTE — ED Triage Notes (Signed)
Pt here for right ear pain. She had a sore throat last week and that has resolved.

## 2018-03-06 ENCOUNTER — Inpatient Hospital Stay (HOSPITAL_COMMUNITY)
Admission: AD | Admit: 2018-03-06 | Discharge: 2018-03-06 | Disposition: A | Discharge status: Home / Self Care | Payer: 59 | Source: Ambulatory Visit | Attending: Obstetrics & Gynecology | Admitting: Obstetrics & Gynecology

## 2018-03-06 ENCOUNTER — Encounter (HOSPITAL_COMMUNITY): Payer: Self-pay

## 2018-03-06 DIAGNOSIS — Z3201 Encounter for pregnancy test, result positive: Secondary | ICD-10-CM | POA: Diagnosis not present

## 2018-03-06 DIAGNOSIS — R42 Dizziness and giddiness: Secondary | ICD-10-CM | POA: Diagnosis not present

## 2018-03-06 DIAGNOSIS — Z3A01 Less than 8 weeks gestation of pregnancy: Secondary | ICD-10-CM | POA: Insufficient documentation

## 2018-03-06 DIAGNOSIS — N912 Amenorrhea, unspecified: Secondary | ICD-10-CM | POA: Diagnosis not present

## 2018-03-06 DIAGNOSIS — N644 Mastodynia: Secondary | ICD-10-CM | POA: Diagnosis not present

## 2018-03-06 LAB — URINALYSIS, ROUTINE W REFLEX MICROSCOPIC
BILIRUBIN URINE: NEGATIVE
Glucose, UA: NEGATIVE mg/dL
HGB URINE DIPSTICK: NEGATIVE
Ketones, ur: NEGATIVE mg/dL
Leukocytes, UA: NEGATIVE
Nitrite: NEGATIVE
PROTEIN: NEGATIVE mg/dL
Specific Gravity, Urine: 1.028 (ref 1.005–1.030)
pH: 6 (ref 5.0–8.0)

## 2018-03-06 LAB — POCT PREGNANCY, URINE: PREG TEST UR: POSITIVE — AB

## 2018-03-06 NOTE — MAU Provider Note (Signed)
Chief Complaint: Possible Pregnancy; Fatigue; and Breast Tenderness   SUBJECTIVE HPI: Brenda [name] is a 32 y.o. G3P2002 at [redacted]w[redacted]d who presents to MAU missed menses, LMP was 01/18/2018, and unsure of that cycle. Pt denies any n, v, d, rashes, fever, cp or sob, no vaginal discharge, dysuria, vaginal bleeding or pain. Pt endorses being in denial about being pregnant and has not taken a home pregnancy test. Pt endorse sonly having fatigue and breast tenderness. Pt stated she had last PAP with g/c testing which was neg in January of 2019   Past Medical History:  Diagnosis Date  . Abnormal Pap smear 2007   colpo  . Asthma    childhood - PRN inhaler  . Fibroid    4 fibroids   . Gestational diabetes    first pregnancy, diet controlled  . H/O chlamydia infection 2010  . H/O varicella   . Mass of neck 03/02/2014  . Obese   . Rubella non-immune status 01/11/2012  . Vaginal Pap smear, abnormal    biopsy - normal    OB History  Gravida Para Term Preterm AB Living  3 2 2  0 0 2  SAB TAB Ectopic Multiple Live Births  0 0 0 0 2    # Outcome Date GA Lbr Len/2nd Weight Sex Delivery Anes PTL Lv  3 Current           2 Term 02/28/16 [redacted]w[redacted]d  3.815 kg (8 lb 6.6 oz) M CS-LTranv Spinal  LIV     Birth Comments: [id number]  1 Term 01/15/12 [redacted]w[redacted]d 17:09 / 02:11 2.523 kg (5 lb 9 oz) F Vag-Spont EPI  LIV   Past Surgical History:  Procedure Laterality Date  . CESAREAN SECTION N/A 02/28/2016   Procedure: CESAREAN SECTION;  Surgeon: [name], MD;  Location: [facility];  Service: Obstetrics;  Laterality: N/A;   Social History   Socioeconomic History  . Marital status: Single    Spouse name: Not on file  . Number of children: Not on file  . Years of education: Not on file  . Highest education level: Not on file  Occupational History  . Not on file  Social Needs  . Financial resource strain: Not on file  . Food insecurity:    Worry: Not on file    Inability: Not on file  . Transportation  needs:    Medical: Not on file    Non-medical: Not on file  Tobacco Use  . Smoking status: Never Smoker  . Smokeless tobacco: Never Used  Substance and Sexual Activity  . Alcohol use: Yes    Comment: occasional  . Drug use: No  . Sexual activity: Yes    Birth control/protection: IUD    Comment: sept 2017 - removed   Lifestyle  . Physical activity:    Days per week: Not on file    Minutes per session: Not on file  . Stress: Not on file  Relationships  . Social connections:    Talks on phone: Not on file    Gets together: Not on file    Attends religious service: Not on file    Active member of club or organization: Not on file    Attends meetings of clubs or organizations: Not on file    Relationship status: Not on file  . Intimate partner violence:    Fear of current or ex partner: Not on file    Emotionally abused: Not on file    Physically abused: Not  on file    Forced sexual activity: Not on file  Other Topics Concern  . Not on file  Social History Narrative   Engaged, 19 year old daughter   No current facility-administered medications on file prior to encounter.    Current Outpatient Medications on File Prior to Encounter  Medication Sig Dispense Refill  . acyclovir cream (ZOVIRAX) 5 % Apply 1 application topically as needed (outbreaks).    Marland Kitchen albuterol (PROVENTIL HFA;VENTOLIN HFA) 108 (90 Base) MCG/ACT inhaler Inhale 2 puffs into the lungs every 6 (six) hours as needed for wheezing or shortness of breath. 1 Inhaler 2  . fluticasone (FLONASE) 50 MCG/ACT nasal spray Place 2 sprays into both nostrils daily. 16 g 1  . ibuprofen (ADVIL,MOTRIN) 600 MG tablet Take 1 tablet (600 mg total) by mouth every 6 (six) hours. 60 tablet 2  . loratadine (CLARITIN) 10 MG tablet Take 1 tablet (10 mg total) by mouth daily. 30 tablet 11  . montelukast (SINGULAIR) 10 MG tablet Take 1 tablet (10 mg total) by mouth at bedtime. 90 tablet 1  . Prenatal Vit-Fe Fumarate-FA (PRENATAL MULTIVITAMIN)  TABS tablet Take 1 tablet by mouth daily at 12 noon.    . sodium chloride (OCEAN) 0.65 % SOLN nasal spray Place 1 spray into both nostrils as needed for congestion. 15 mL 0  . valACYclovir (VALTREX) 500 MG tablet Take 500 mg by mouth 1 day or 1 dose. Takes with breakouts prn     No Known Allergies  I have reviewed the past Medical Hx, Surgical Hx, Social Hx, Allergies and Medications.   REVIEW OF SYSTEMS All systems reviewed and are negative for acute change except as noted in the HPI.   OBJECTIVE BP 128/73 (BP Location: Right Arm)   Pulse 100   Temp 98.1 F (36.7 C) (Oral)   Resp 18   Ht 5' 3.5" (1.613 m)   Wt 110.2 kg (243 lb)   LMP 01/18/2018   BMI 42.37 kg/m    PHYSICAL EXAM Constitutional: Well-developed, obese well-nourished female in no acute distress.  Cardiovascular: normal rate and rhythm, pulses intact Respiratory: normal rate and effort.  GI: Abd soft, non-tender, non-distended. Pos BS x 4 MS: Extremities nontender, no edema, normal ROM Neurologic: Alert and oriented x 4. No focal deficits GU: Neg CVAT. SPECULUM EXAM: Deferred BIMANUAL: Deferred Psych: normal mood and affect  LAB RESULTS Results for orders placed or performed during the hospital encounter of 03/06/18 (from the past 24 hour(s))  Urinalysis, Routine w reflex microscopic     Status: None   Collection Time: 03/06/18 12:15 PM  Result Value Ref Range   Color, Urine YELLOW YELLOW   APPearance CLEAR CLEAR   Specific Gravity, Urine 1.028 1.005 - 1.030   pH 6.0 5.0 - 8.0   Glucose, UA NEGATIVE NEGATIVE mg/dL   Hgb urine dipstick NEGATIVE NEGATIVE   Bilirubin Urine NEGATIVE NEGATIVE   Ketones, ur NEGATIVE NEGATIVE mg/dL   Protein, ur NEGATIVE NEGATIVE mg/dL   Nitrite NEGATIVE NEGATIVE   Leukocytes, UA NEGATIVE NEGATIVE  Pregnancy, urine POC     Status: Abnormal   Collection Time: 03/06/18 12:19 PM  Result Value Ref Range   Preg Test, Ur POSITIVE (A) NEGATIVE    IMAGING No results  found.  MAU Management/MDM: Vitals and nursing notes reviewed Orders Placed This Encounter  Procedures  . Urinalysis, Routine w reflex microscopic  . Diet - low sodium heart healthy  . Increase activity slowly  . Call MD for:  .  Call MD for:  temperature >100.4  . Call MD for:  persistant nausea and vomiting  . Call MD for:  severe uncontrolled pain  . Call MD for:  redness, tenderness, or signs of infection (pain, swelling, redness, odor or green/yellow discharge around incision site)  . Call MD for:  difficulty breathing, headache or visual disturbances  . Call MD for:  hives  . Call MD for:  persistant dizziness or light-headedness  . Call MD for:  extreme fatigue  . (HEART FAILURE PATIENTS) Call MD:  Anytime you have any of the following symptoms: 1) 3 pound weight gain in 24 hours or 5 pounds in 1 week 2) shortness of breath, with or without a dry hacking cough 3) swelling in the hands, feet or stomach 4) if you have to sleep on extra pillows at night in order to breathe.  . Pregnancy, urine POC  . Discharge patient Discharge disposition: 01-Home or Self Care; Discharge patient date: 03/06/2018    No orders of the defined types were placed in this encounter.   Plan of care reviewed with patient, including labs and tests ordered and medical treatment.  Consult Dr Alesia Richards.  Treatments in MAU included Positive UPT, Discharged home.   ASSESSMENT  Brenda [name] is a 32 y.o. G3P2002 at [redacted]w[redacted]d verify with UPT, positive today. Discharged home in stable conditions.  1. Less than [redacted] weeks gestation of pregnancy     PLAN Discharge home in stable condition. F/U with [facility] for NOB Counseled on return precautions Handout given  [facility] Follow up.   Specialty:  Obstetrics and Gynecology Why:  call office to set up NOB Contact information: [address]. [address] [address] [phone]           Allergies as of 03/06/2018   No Known Allergies     Medication List    TAKE these medications   acyclovir cream 5 % Commonly known as:  ZOVIRAX Apply 1 application topically as needed (outbreaks).   albuterol 108 (90 Base) MCG/ACT inhaler Commonly known as:  PROVENTIL HFA;VENTOLIN HFA Inhale 2 puffs into the lungs every 6 (six) hours as needed for wheezing or shortness of breath.   fluticasone 50 MCG/ACT nasal spray Commonly known as:  FLONASE Place 2 sprays into both nostrils daily.   ibuprofen 600 MG tablet Commonly known as:  ADVIL,MOTRIN Take 1 tablet (600 mg total) by mouth every 6 (six) hours.   loratadine 10 MG tablet Commonly known as:  CLARITIN Take 1 tablet (10 mg total) by mouth daily.   montelukast 10 MG tablet Commonly known as:  SINGULAIR Take 1 tablet (10 mg total) by mouth at bedtime.   prenatal multivitamin Tabs tablet Take 1 tablet by mouth daily at 12 noon.   sodium chloride 0.65 % Soln nasal spray Commonly known as:  OCEAN Place 1 spray into both nostrils as needed for congestion.   valACYclovir 500 MG tablet Commonly known as:  VALTREX Take 500 mg by mouth 1 day or 1 dose. Takes with breakouts prn      Ranyah Groeneveld CNM, NP-C 03/06/2018, 1:14 PM

## 2018-03-06 NOTE — Progress Notes (Addendum)
G2P2 with missed period last month. Presents to triage for dizziness, breast tenderness, and intermittent hot spells.   Denies pain, vaginal discharge or bleeding.   Unsure if pregnant and did not take HPT. LMP June 11 which was abnormal with little brownish discharge.   UPT and urine ordered.   BP 128/73 (BP Location: Right Arm)   Pulse 100   Temp 98.1 F (36.7 C) (Oral)   Resp 18   Ht 5' 3.5" (1.613 m)   Wt 243 lb (110.2 kg)   LMP 01/18/2018   BMI 42.37 kg/m   1210: provider at bs assessing. UPT + and pt informed. Informed pt the symptoms pt is having are all due to pregnancy.  Informed pt to make OB appt by calling the office on Monday. pt informed will be discharged from MAU dt no complaints of abd pain or vag. Discharge. Pt verbalized understanding  1300: notified provider for d/c orders.   1310: D/c instructions given with pt understanding. Pt made aware of making OB appt on Monday.

## 2018-03-06 NOTE — MAU Note (Signed)
LMP 06/11, not a normal period, mostly just brown discharge  Tender breasts, hot spells intermittently and none since Thursday  No pain, no vag bleeding, no vag discharge  Has not taken HPT

## 2018-03-24 DIAGNOSIS — O99210 Obesity complicating pregnancy, unspecified trimester: Secondary | ICD-10-CM | POA: Insufficient documentation

## 2018-03-24 DIAGNOSIS — N925 Other specified irregular menstruation: Secondary | ICD-10-CM | POA: Diagnosis not present

## 2018-03-24 LAB — OB RESULTS CONSOLE RPR: RPR: NONREACTIVE

## 2018-03-24 LAB — OB RESULTS CONSOLE GC/CHLAMYDIA
Chlamydia: NEGATIVE
Gonorrhea: NEGATIVE

## 2018-03-24 LAB — OB RESULTS CONSOLE ABO/RH: RH Type: POSITIVE

## 2018-03-24 LAB — OB RESULTS CONSOLE ANTIBODY SCREEN: Antibody Screen: NEGATIVE

## 2018-03-24 LAB — OB RESULTS CONSOLE HEPATITIS B SURFACE ANTIGEN: Hepatitis B Surface Ag: NEGATIVE

## 2018-03-24 LAB — OB RESULTS CONSOLE RUBELLA ANTIBODY, IGM: Rubella: IMMUNE

## 2018-03-24 LAB — OB RESULTS CONSOLE HIV ANTIBODY (ROUTINE TESTING): HIV: NONREACTIVE

## 2018-03-29 DIAGNOSIS — Z3490 Encounter for supervision of normal pregnancy, unspecified, unspecified trimester: Secondary | ICD-10-CM | POA: Insufficient documentation

## 2018-05-17 DIAGNOSIS — Z3A20 20 weeks gestation of pregnancy: Secondary | ICD-10-CM | POA: Diagnosis not present

## 2018-05-25 DIAGNOSIS — O99213 Obesity complicating pregnancy, third trimester: Secondary | ICD-10-CM | POA: Diagnosis not present

## 2018-05-25 DIAGNOSIS — Z3493 Encounter for supervision of normal pregnancy, unspecified, third trimester: Secondary | ICD-10-CM | POA: Diagnosis not present

## 2018-07-06 ENCOUNTER — Inpatient Hospital Stay (HOSPITAL_COMMUNITY)
Admission: AD | Admit: 2018-07-06 | Discharge: 2018-07-06 | Disposition: A | Discharge status: Home / Self Care | Payer: 59 | Source: Ambulatory Visit | Attending: Obstetrics and Gynecology | Admitting: Obstetrics and Gynecology

## 2018-07-06 ENCOUNTER — Encounter (HOSPITAL_COMMUNITY): Payer: Self-pay

## 2018-07-06 DIAGNOSIS — N760 Acute vaginitis: Secondary | ICD-10-CM | POA: Insufficient documentation

## 2018-07-06 DIAGNOSIS — O26892 Other specified pregnancy related conditions, second trimester: Secondary | ICD-10-CM | POA: Diagnosis not present

## 2018-07-06 DIAGNOSIS — O23592 Infection of other part of genital tract in pregnancy, second trimester: Secondary | ICD-10-CM

## 2018-07-06 DIAGNOSIS — Z3A27 27 weeks gestation of pregnancy: Secondary | ICD-10-CM | POA: Diagnosis not present

## 2018-07-06 DIAGNOSIS — N898 Other specified noninflammatory disorders of vagina: Secondary | ICD-10-CM | POA: Diagnosis present

## 2018-07-06 LAB — WET PREP, GENITAL
Clue Cells Wet Prep HPF POC: NONE SEEN
Sperm: NONE SEEN
TRICH WET PREP: NONE SEEN
YEAST WET PREP: NONE SEEN

## 2018-07-06 MED ORDER — TERCONAZOLE 0.8 % VA CREA: 1.0000 | TOPICAL_CREAM | Freq: Every day | VAGINAL | 0 refills | Status: DC

## 2018-07-06 NOTE — MAU Note (Addendum)
Pt states she used Veet on vagina a few days ago. States she used OTC monistat several days later because she thought she had a yeast infection. Reports she is now very itchy, has a burning sensation and irritated. Pt denies discharge, odor, or vaginal bleeding. Reports good fetal movement. Pt denies pain or contractions.

## 2018-07-06 NOTE — MAU Provider Note (Signed)
Chief Complaint: Vaginal Itching   First Provider Initiated Contact with Patient 07/06/18 2319     SUBJECTIVE HPI: Brenda [name] is a 32 y.o. G3P2002 at [redacted]w[redacted]d who presents to [facility] Admissions reporting vaginal irritation. Symptoms began Sunday after using hair removal cream. Has been self medicating with OTC monistat without relief. 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  No vaginal bleeding or dysuria.  No OB complaints.   Location: vagina Quality: burning Severity: 5/10 on pain scale Duration: 4 days Timing: constant Modifying factors: not improved with monistat Associated signs and symptoms: vaginal discharge  Past Medical History:  Diagnosis Date  . Abnormal Pap smear 2007   colpo  . Asthma    childhood - PRN inhaler  . Fibroid    4 fibroids   . Gestational diabetes    first pregnancy, diet controlled  . H/O chlamydia infection 2010  . H/O varicella   . Mass of neck 03/02/2014  . Obese   . Rubella non-immune status 01/11/2012  . Vaginal Pap smear, abnormal    biopsy - normal    OB History  Gravida Para Term Preterm AB Living  3 2 2  0 0 2  SAB TAB Ectopic Multiple Live Births  0 0 0 0 2    # Outcome Date GA Lbr Len/2nd Weight Sex Delivery Anes PTL Lv  3 Current           2 Term 02/28/16 [redacted]w[redacted]d  3815 g M CS-LTranv Spinal  LIV     Birth Comments: [id number]  1 Term 01/15/12 [redacted]w[redacted]d 17:09 / 02:11 2523 g F Vag-Spont EPI  LIV   Past Surgical History:  Procedure Laterality Date  . CESAREAN SECTION N/A 02/28/2016   Procedure: CESAREAN SECTION;  Surgeon: [name], MD;  Location: [facility];  Service: Obstetrics;  Laterality: N/A;   Social History   Socioeconomic History  . Marital status: Single    Spouse name: Not on file  . Number of children: Not on file  . Years of education: Not on file  . Highest education level: Not on file  Occupational History  . Not on file  Social Needs  . Financial resource strain: Not on file  . Food insecurity:    Worry:  Not on file    Inability: Not on file  . Transportation needs:    Medical: Not on file    Non-medical: Not on file  Tobacco Use  . Smoking status: Never Smoker  . Smokeless tobacco: Never Used  Substance and Sexual Activity  . Alcohol use: Yes    Comment: occasional  . Drug use: No  . Sexual activity: Yes    Birth control/protection: IUD    Comment: sept 2017 - removed   Lifestyle  . Physical activity:    Days per week: Not on file    Minutes per session: Not on file  . Stress: Not on file  Relationships  . Social connections:    Talks on phone: Not on file    Gets together: Not on file    Attends religious service: Not on file    Active member of club or organization: Not on file    Attends meetings of clubs or organizations: Not on file    Relationship status: Not on file  . Intimate partner violence:    Fear of current or ex partner: Not on file    Emotionally abused: Not on file    Physically abused: Not on file  Forced sexual activity: Not on file  Other Topics Concern  . Not on file  Social History Narrative   Engaged, 40 year old daughter   Family History  Problem Relation Age of Onset  . Hypertension Mother        diet controlled no meds  . Hypertension Father   . Alcohol abuse Father   . Heart disease Maternal Aunt   . Mental illness Maternal Aunt        bipolar  . Diabetes Maternal Aunt   . Heart disease Maternal Grandmother   . Hypertension Maternal Grandmother   . Thrombophlebitis Maternal Grandmother   . Diabetes Maternal Grandmother   . Stroke Maternal Grandmother   . Hypertension Paternal Grandmother   . Heart disease Paternal Grandmother   . Asthma Cousin   . Cancer Cousin   . Heart disease Cousin        congenital heart disease  . Learning disabilities Cousin   . Anesthesia problems Neg Hx    No current facility-administered medications on file prior to encounter.    Current Outpatient Medications on File Prior to Encounter   Medication Sig Dispense Refill  . acyclovir cream (ZOVIRAX) 5 % Apply 1 application topically as needed (outbreaks).    Marland Kitchen albuterol (PROVENTIL HFA;VENTOLIN HFA) 108 (90 Base) MCG/ACT inhaler Inhale 2 puffs into the lungs every 6 (six) hours as needed for wheezing or shortness of breath. 1 Inhaler 2  . fluticasone (FLONASE) 50 MCG/ACT nasal spray Place 2 sprays into both nostrils daily. 16 g 1  . loratadine (CLARITIN) 10 MG tablet Take 1 tablet (10 mg total) by mouth daily. 30 tablet 11  . montelukast (SINGULAIR) 10 MG tablet Take 1 tablet (10 mg total) by mouth at bedtime. 90 tablet 1  . Prenatal Vit-Fe Fumarate-FA (PRENATAL MULTIVITAMIN) TABS tablet Take 1 tablet by mouth daily at 12 noon.    . sodium chloride (OCEAN) 0.65 % SOLN nasal spray Place 1 spray into both nostrils as needed for congestion. 15 mL 0  . valACYclovir (VALTREX) 500 MG tablet Take 500 mg by mouth 1 day or 1 dose. Takes with breakouts prn     No Known Allergies  I have reviewed patient's Past Medical Hx, Surgical Hx, Family Hx, Social Hx, medications and allergies.   Review of Systems  Constitutional: Negative.   Gastrointestinal: Negative.   Genitourinary: Positive for vaginal discharge and vaginal pain. Negative for dysuria and vaginal bleeding.    OBJECTIVE Patient Vitals for the past 24 hrs:  BP Temp Temp src Pulse Resp SpO2 Height Weight  07/06/18 2347 115/80 - - (!) 108 17 - - -  07/06/18 2251 127/76 98.1 F (36.7 C) Oral 81 19 98 % - -  07/06/18 2248 - - - - - - 5' 3.5" (1.613 m) 114.8 kg   Constitutional: Well-developed, well-nourished female in no acute distress.  Respiratory: normal rate and effort. MS: Extremities nontender, no edema, normal ROM Neurologic: Alert and oriented x 4.  GU:  NEFG, no lesions. Vaginal walls erythematous. Moderate amount of white clumpy discharge & vaginal medication   LAB RESULTS Results for orders placed or performed during the hospital encounter of 07/06/18 (from the  past 24 hour(s))  Wet prep, genital     Status: Abnormal   Collection Time: 07/06/18 10:55 PM  Result Value Ref Range   Yeast Wet Prep HPF POC NONE SEEN NONE SEEN   Trich, Wet Prep NONE SEEN NONE SEEN   Clue Cells Wet Prep  HPF POC NONE SEEN NONE SEEN   WBC, Wet Prep HPF POC FEW (A) NONE SEEN   Sperm NONE SEEN     IMAGING No results found.  MAU COURSE Orders Placed This Encounter  Procedures  . Wet prep, genital  . Discharge patient   Meds ordered this encounter  Medications  . terconazole (TERAZOL 3) 0.8 % vaginal cream    Sig: Place 1 applicator vaginally at bedtime.    Dispense:  20 g    Refill:  0    Order Specific Question:   Supervising Provider    Answer:   Marjory Lies    MDM FHT obtained via doppler VSS, NAD GC/CT & wet prep collected Wet prep negative. May be skewed by moderate amount of monistat cream in vagina. No external lesions or symptoms. Will tx with terazol  ASSESSMENT 1. Vaginitis affecting pregnancy in second trimester, antepartum   2. [redacted] weeks gestation of pregnancy     PLAN Discharge home in stable condition. Avoid intercourse until symptoms resolve D/c use of hair removal products  Follow-up Information    [facility] Follow up.   Specialty:  Obstetrics and Gynecology Contact information: [address] [address] [phone]         Allergies as of 07/06/2018   No Known Allergies     Medication List    STOP taking these medications   ibuprofen 600 MG tablet Commonly known as:  ADVIL,MOTRIN     TAKE these medications   acyclovir cream 5 % Commonly known as:  ZOVIRAX Apply 1 application topically as needed (outbreaks).   albuterol 108 (90 Base) MCG/ACT inhaler Commonly known as:  PROVENTIL HFA;VENTOLIN HFA Inhale 2 puffs into the lungs every 6 (six) hours as needed for wheezing or shortness of breath.   fluticasone 50 MCG/ACT nasal  spray Commonly known as:  FLONASE Place 2 sprays into both nostrils daily.   loratadine 10 MG tablet Commonly known as:  CLARITIN Take 1 tablet (10 mg total) by mouth daily.   montelukast 10 MG tablet Commonly known as:  SINGULAIR Take 1 tablet (10 mg total) by mouth at bedtime.   prenatal multivitamin Tabs tablet Take 1 tablet by mouth daily at 12 noon.   sodium chloride 0.65 % Soln nasal spray Commonly known as:  OCEAN Place 1 spray into both nostrils as needed for congestion.   terconazole 0.8 % vaginal cream Commonly known as:  TERAZOL 3 Place 1 applicator vaginally at bedtime.   valACYclovir 500 MG tablet Commonly known as:  VALTREX Take 500 mg by mouth 1 day or 1 dose. Takes with breakouts prn        Jorje Guild, NP 07/06/2018  11:51 PM

## 2018-07-06 NOTE — MAU Note (Signed)
Urine in lab 

## 2018-07-06 NOTE — Discharge Instructions (Signed)

## 2018-07-08 LAB — GC/CHLAMYDIA PROBE AMP (~~LOC~~) NOT AT ARMC
Chlamydia: NEGATIVE
Neisseria Gonorrhea: NEGATIVE

## 2018-07-12 DIAGNOSIS — Z3A28 28 weeks gestation of pregnancy: Secondary | ICD-10-CM | POA: Diagnosis not present

## 2018-07-12 DIAGNOSIS — O99213 Obesity complicating pregnancy, third trimester: Secondary | ICD-10-CM | POA: Diagnosis not present

## 2018-07-25 ENCOUNTER — Other Ambulatory Visit: Payer: Self-pay | Admitting: Obstetrics and Gynecology

## 2018-07-25 DIAGNOSIS — Z23 Encounter for immunization: Secondary | ICD-10-CM | POA: Diagnosis not present

## 2018-07-25 DIAGNOSIS — Z369 Encounter for antenatal screening, unspecified: Secondary | ICD-10-CM | POA: Diagnosis not present

## 2018-08-04 ENCOUNTER — Encounter: Payer: Self-pay | Admitting: Internal Medicine

## 2018-08-04 ENCOUNTER — Ambulatory Visit (INDEPENDENT_AMBULATORY_CARE_PROVIDER_SITE_OTHER): Payer: 59 | Admitting: Internal Medicine

## 2018-08-04 VITALS — BP 120/70 | HR 109 | Temp 98.5°F | Ht 63.5 in | Wt 252.0 lb

## 2018-08-04 DIAGNOSIS — J101 Influenza due to other identified influenza virus with other respiratory manifestations: Secondary | ICD-10-CM

## 2018-08-04 DIAGNOSIS — R6889 Other general symptoms and signs: Secondary | ICD-10-CM | POA: Diagnosis not present

## 2018-08-04 DIAGNOSIS — O98913 Unspecified maternal infectious and parasitic disease complicating pregnancy, third trimester: Secondary | ICD-10-CM

## 2018-08-04 LAB — POC INFLUENZA A&B (BINAX/QUICKVUE)
INFLUENZA A, POC: NEGATIVE
Influenza B, POC: POSITIVE — AB

## 2018-08-04 MED ORDER — OSELTAMIVIR PHOSPHATE 75 MG PO CAPS: 75.0000 mg | ORAL_CAPSULE | Freq: Two times a day (BID) | ORAL | 0 refills | Status: AC

## 2018-08-04 NOTE — Assessment & Plan Note (Signed)
We talked about safe options in pregnancy including claritin, nose spray saline. She is given rx for tamiflu and encouraged to seek care if worsening or lack of improvement. Encouraged to get flu vaccine 1-2 weeks after flu illness is over to prevent re-infection.

## 2018-08-04 NOTE — Progress Notes (Signed)
   Subjective:   Patient ID: Brenda [name], female    DOB: 1986/07/10, 32 y.o.   MRN: [id number]  HPI The patient is a 32 y.o. female coming in for cold symptoms. Started 2 days ago. Currently pregnant 3rd trimester. Normal fetal movement. Main symptoms are: chills, congestion, cough, drainage, muscle aches. Denies headaches or SOB. Has not used albuterol but still has inhaler at home. Overall it is worsening. Has tried sudafed but did not contact gyn to make sure okay. She has claritin, singulair and flonase but did not take those as she was not sure they were okay in pregnancy. She did not get flu shot this season.   Review of Systems  Constitutional: Positive for activity change, appetite change and chills. Negative for fatigue, fever and unexpected weight change.  HENT: Positive for congestion, postnasal drip, rhinorrhea, sinus pressure and sore throat. Negative for ear discharge, ear pain, sinus pain, sneezing, tinnitus, trouble swallowing and voice change.   Eyes: Negative.   Respiratory: Positive for cough. Negative for chest tightness, shortness of breath and wheezing.   Cardiovascular: Negative.   Gastrointestinal: Positive for nausea.  Genitourinary:       Normal fetal movement  Musculoskeletal: Positive for myalgias.  Neurological: Negative.     Objective:  Physical Exam Constitutional:      Appearance: She is well-developed.  HENT:     Head: Normocephalic and atraumatic.     Comments: Oropharynx with redness and clear drainage, nose with swollen turbinates, TMs normal bilaterally.  Neck:     Musculoskeletal: Normal range of motion.     Thyroid: No thyromegaly.  Cardiovascular:     Rate and Rhythm: Normal rate and regular rhythm.  Pulmonary:     Effort: Pulmonary effort is normal. No respiratory distress.     Breath sounds: Normal breath sounds. No wheezing or rales.  Abdominal:     General: There is distension.     Palpations: Abdomen is soft.  Lymphadenopathy:     Cervical: No cervical adenopathy.  Skin:    General: Skin is warm and dry.  Neurological:     Mental Status: She is alert and oriented to person, place, and time.     Vitals:   08/04/18 1421  BP: 120/70  Pulse: (!) 109  Temp: 98.5 F (36.9 C)  TempSrc: Oral  SpO2: 97%  Weight: 252 lb (114.3 kg)  Height: 5' 3.5" (1.613 m)    Assessment & Plan:

## 2018-08-04 NOTE — Patient Instructions (Addendum)
You have the flu and we have sent in tamiflu for you. Take 1 pill twice a day for 5 days.   You are contagious and it is very important if you are going in for any ob visits in the next 1-2 weeks to request a mask at check in and wear that for the entire duration of the visit.   It is okay to take the claritin. It is also okay to do nasal saline spray to help.  The flu generally lasts 1-2 weeks and symptoms are typically worst around day 5.

## 2018-08-04 NOTE — Assessment & Plan Note (Signed)
Flu test positive in office. Current 3rd trimester pregnancy, counseled per separate. Rx for tamiflu and encouraged to get flu vaccine once illness over.

## 2018-08-09 DIAGNOSIS — O99213 Obesity complicating pregnancy, third trimester: Secondary | ICD-10-CM | POA: Diagnosis not present

## 2018-08-09 DIAGNOSIS — Z3A32 32 weeks gestation of pregnancy: Secondary | ICD-10-CM | POA: Diagnosis not present

## 2018-08-17 DIAGNOSIS — Z3483 Encounter for supervision of other normal pregnancy, third trimester: Secondary | ICD-10-CM | POA: Diagnosis not present

## 2018-08-17 DIAGNOSIS — Z3482 Encounter for supervision of other normal pregnancy, second trimester: Secondary | ICD-10-CM | POA: Diagnosis not present

## 2018-08-18 DIAGNOSIS — Z3A33 33 weeks gestation of pregnancy: Secondary | ICD-10-CM | POA: Diagnosis not present

## 2018-08-18 DIAGNOSIS — R81 Glycosuria: Secondary | ICD-10-CM | POA: Diagnosis not present

## 2018-08-18 DIAGNOSIS — O99213 Obesity complicating pregnancy, third trimester: Secondary | ICD-10-CM | POA: Diagnosis not present

## 2018-08-26 DIAGNOSIS — Z3A34 34 weeks gestation of pregnancy: Secondary | ICD-10-CM | POA: Diagnosis not present

## 2018-08-26 DIAGNOSIS — O99213 Obesity complicating pregnancy, third trimester: Secondary | ICD-10-CM | POA: Diagnosis not present

## 2018-08-30 DIAGNOSIS — O99213 Obesity complicating pregnancy, third trimester: Secondary | ICD-10-CM | POA: Diagnosis not present

## 2018-08-30 DIAGNOSIS — Z3A35 35 weeks gestation of pregnancy: Secondary | ICD-10-CM | POA: Diagnosis not present

## 2018-09-08 DIAGNOSIS — R81 Glycosuria: Secondary | ICD-10-CM | POA: Diagnosis not present

## 2018-09-08 DIAGNOSIS — Z3686 Encounter for antenatal screening for cervical length: Secondary | ICD-10-CM | POA: Diagnosis not present

## 2018-09-08 DIAGNOSIS — O26849 Uterine size-date discrepancy, unspecified trimester: Secondary | ICD-10-CM | POA: Diagnosis not present

## 2018-09-08 DIAGNOSIS — Z3A36 36 weeks gestation of pregnancy: Secondary | ICD-10-CM | POA: Diagnosis not present

## 2018-09-08 DIAGNOSIS — O99213 Obesity complicating pregnancy, third trimester: Secondary | ICD-10-CM | POA: Diagnosis not present

## 2018-09-16 DIAGNOSIS — Z3A37 37 weeks gestation of pregnancy: Secondary | ICD-10-CM | POA: Diagnosis not present

## 2018-09-16 DIAGNOSIS — O99213 Obesity complicating pregnancy, third trimester: Secondary | ICD-10-CM | POA: Diagnosis not present

## 2018-09-20 ENCOUNTER — Encounter (HOSPITAL_COMMUNITY): Payer: Self-pay

## 2018-09-20 ENCOUNTER — Telehealth (HOSPITAL_COMMUNITY): Payer: Self-pay | Admitting: *Deleted

## 2018-09-20 ENCOUNTER — Other Ambulatory Visit: Payer: Self-pay

## 2018-09-20 ENCOUNTER — Inpatient Hospital Stay (HOSPITAL_COMMUNITY)
Admission: AD | Admit: 2018-09-20 | Discharge: 2018-09-20 | Discharge status: Against Medical Advice | Payer: 59 | Source: Ambulatory Visit | Attending: Obstetrics & Gynecology | Admitting: Obstetrics & Gynecology

## 2018-09-20 DIAGNOSIS — Z3A38 38 weeks gestation of pregnancy: Secondary | ICD-10-CM | POA: Insufficient documentation

## 2018-09-20 DIAGNOSIS — N898 Other specified noninflammatory disorders of vagina: Secondary | ICD-10-CM | POA: Diagnosis not present

## 2018-09-20 DIAGNOSIS — Z3403 Encounter for supervision of normal first pregnancy, third trimester: Secondary | ICD-10-CM

## 2018-09-20 DIAGNOSIS — O26893 Other specified pregnancy related conditions, third trimester: Secondary | ICD-10-CM | POA: Diagnosis present

## 2018-09-20 DIAGNOSIS — O471 False labor at or after 37 completed weeks of gestation: Secondary | ICD-10-CM | POA: Diagnosis not present

## 2018-09-20 LAB — WET PREP, GENITAL
Clue Cells Wet Prep HPF POC: NONE SEEN
SPERM: NONE SEEN
Trich, Wet Prep: NONE SEEN
WBC WET PREP: NONE SEEN
Yeast Wet Prep HPF POC: NONE SEEN

## 2018-09-20 LAB — AMNISURE RUPTURE OF MEMBRANE (ROM) NOT AT ARMC: Amnisure ROM: NEGATIVE

## 2018-09-20 LAB — URINALYSIS, ROUTINE W REFLEX MICROSCOPIC
Bilirubin Urine: NEGATIVE
Glucose, UA: NEGATIVE mg/dL
Hgb urine dipstick: NEGATIVE
Ketones, ur: NEGATIVE mg/dL
Leukocytes,Ua: NEGATIVE
Nitrite: NEGATIVE
PROTEIN: NEGATIVE mg/dL
Specific Gravity, Urine: 1.015 (ref 1.005–1.030)
pH: 7 (ref 5.0–8.0)

## 2018-09-20 LAB — POCT FERN TEST: POCT Fern Test: NEGATIVE

## 2018-09-20 NOTE — MAU Note (Signed)
Pt states she noticed a small gush of fluid around 0830 after getting out of shower.  Pt denies vag bleeding or ctx but reports some mild cramping.  Pt reports good fetal movement

## 2018-09-20 NOTE — MAU Note (Signed)
Had a gush of clear fluid at 0830, some trickling.  No bleeding.  Mild, menstrual like cramps.  Denies problems with preg.

## 2018-09-20 NOTE — MAU Provider Note (Signed)
History     CSN: [id number]  Arrival date and time: 09/20/18 9892   None     Chief Complaint  Patient presents with  . Rupture of Membranes   Patient noted a gush of fluid this morning after she got out of shower. She sat on bed and felt gush of fluid which stained her duvet. She reports it did not smell like urine but she's not sure of the color. She continued to have a trickle of fluid which wet her pad. She reports she feels wet now but is not feeling fluid dripping anymore. She reports feeling mild braxton hicks contractions at night. She reports having one contractions so far in MAU. She denies symptoms of UTI or vaginal infection.    OB History    Gravida  3   Para  2   Term  2   Preterm  0   AB  0   Living  2     SAB  0   TAB  0   Ectopic  0   Multiple  0   Live Births  2           Past Medical History:  Diagnosis Date  . Abnormal Pap smear 2007   colpo  . Asthma    childhood - PRN inhaler  . Fibroid    4 fibroids   . Gestational diabetes    first pregnancy, diet controlled  . H/O chlamydia infection 2010  . H/O varicella   . Mass of neck 03/02/2014  . Obese   . Rubella non-immune status 01/11/2012  . Vaginal Pap smear, abnormal    biopsy - normal     Past Surgical History:  Procedure Laterality Date  . CESAREAN SECTION N/A 02/28/2016   Procedure: CESAREAN SECTION;  Surgeon: [name], MD;  Location: [place];  Service: Obstetrics;  Laterality: N/A;    Family History  Problem Relation Age of Onset  . Hypertension Mother        diet controlled no meds  . Hypertension Father   . Alcohol abuse Father   . Heart disease Maternal Aunt   . Mental illness Maternal Aunt        bipolar  . Diabetes Maternal Aunt   . Heart disease Maternal Grandmother   . Hypertension Maternal Grandmother   . Thrombophlebitis Maternal Grandmother   . Diabetes Maternal Grandmother   . Stroke Maternal Grandmother   . Hypertension Paternal  Grandmother   . Heart disease Paternal Grandmother   . Asthma Cousin   . Cancer Cousin   . Heart disease Cousin        congenital heart disease  . Learning disabilities Cousin   . Anesthesia problems Neg Hx     Social History   Tobacco Use  . Smoking status: Never Smoker  . Smokeless tobacco: Never Used  Substance Use Topics  . Alcohol use: Not Currently    Comment: occasional  . Drug use: No    Allergies: No Known Allergies  Medications Prior to Admission  Medication Sig Dispense Refill Last Dose  . acyclovir cream (ZOVIRAX) 5 % Apply 1 application topically as needed (outbreaks).   Taking  . albuterol (PROVENTIL HFA;VENTOLIN HFA) 108 (90 Base) MCG/ACT inhaler Inhale 2 puffs into the lungs every 6 (six) hours as needed for wheezing or shortness of breath. 1 Inhaler 2 Taking  . fluticasone (FLONASE) 50 MCG/ACT nasal spray Place 2 sprays into both nostrils daily. 16 g 1 Taking  .  loratadine (CLARITIN) 10 MG tablet Take 1 tablet (10 mg total) by mouth daily. 30 tablet 11 Taking  . montelukast (SINGULAIR) 10 MG tablet Take 1 tablet (10 mg total) by mouth at bedtime. 90 tablet 1 Taking  . Prenatal Vit-Fe Fumarate-FA (PRENATAL MULTIVITAMIN) TABS tablet Take 1 tablet by mouth daily at 12 noon.   Taking  . sodium chloride (OCEAN) 0.65 % SOLN nasal spray Place 1 spray into both nostrils as needed for congestion. 15 mL 0 Taking  . terconazole (TERAZOL 3) 0.8 % vaginal cream Place 1 applicator vaginally at bedtime. 20 g 0 Taking  . valACYclovir (VALTREX) 500 MG tablet Take 500 mg by mouth 1 day or 1 dose. Takes with breakouts prn   Taking    Review of Systems  Gastrointestinal: Negative for abdominal pain.  Genitourinary: Positive for vaginal discharge. Negative for vaginal pain.  All other systems reviewed and are negative.  Physical Exam   Vitals:   09/20/18 0930 09/20/18 0943  BP:  114/77  Pulse:  (!) 107  Resp:  18  Temp:  98.2 F (36.8 C)  TempSrc:  Oral  SpO2:  97%   Weight: 120 kg    Results for orders placed or performed during the hospital encounter of 09/20/18 (from the past 24 hour(s))  Amnisure rupture of membrane (rom)not at [facility]     Status: None   Collection Time: 09/20/18 10:05 AM  Result Value Ref Range   Amnisure ROM NEGATIVE   Wet prep, genital     Status: None   Collection Time: 09/20/18 10:05 AM  Result Value Ref Range   Yeast Wet Prep HPF POC NONE SEEN NONE SEEN   Trich, Wet Prep NONE SEEN NONE SEEN   Clue Cells Wet Prep HPF POC NONE SEEN NONE SEEN   WBC, Wet Prep HPF POC NONE SEEN NONE SEEN   Sperm NONE SEEN   POCT fern test     Status: None   Collection Time: 09/20/18 10:17 AM  Result Value Ref Range   POCT Fern Test Negative = intact amniotic membranes     Physical Exam  Constitutional: She is oriented to person, place, and time. She appears well-developed and well-nourished.  HENT:  Head: Normocephalic and atraumatic.  Eyes: Pupils are equal, round, and reactive to light.  Respiratory: Effort normal. No respiratory distress.  GI: There is no abdominal tenderness.  Genitourinary:    Vagina and uterus normal.   Musculoskeletal: Normal range of motion.  Neurological: She is alert and oriented to person, place, and time.  Skin: Skin is warm and dry.  Psychiatric: She has a normal mood and affect. Her behavior is normal. Judgment and thought content normal.   Dilation: Fingertip Effacement (%): Thick Cervical Position: Posterior Station: -3 Presentation: Undeterminable Exam by:: Ranee Gosselin, CNM  MAU Course  Procedures NST Fern and Amnisure Wet Prep  MDM On exam, no vaginal discharge was noted. Fern and [place] were both negative. Wet prep was also negative, urinalysis pending. NST is reactive and patient is not having regular contractions that would suggest labor. Patient's membrane status is intact and patient is not laboring. Patient left against medical advice prior to formal discharge. I was not able to  discuss the laboratory results with the patient. She has an appointment at [facility] office tomorrow.   Assessment and Plan  33 y.o. G3P2 at [redacted]w[redacted]d Normal pregnancy in 3rd trimester  Intact membrane status Reactive NST  Labor precautions discussed Patient left against medical advice  Patient has appointment to be seen for [facility] appointment tomorrow at [place] 09/20/2018, 10:39 AM

## 2018-09-20 NOTE — Discharge Instructions (Signed)
First Stage of Labor °Labor is your body's natural process of moving your baby and other structures, including the placenta and umbilical cord, out of your uterus. There are three stages of labor. How long each stage lasts is different for every woman. But certain events happen during each stage that are the same for everyone. °· The first stage starts when true labor begins. This stage ends when your cervix, which is the opening from your uterus into your vagina, is completely open (dilated). °· The second stage begins when your cervix is fully dilated and you start pushing. This stage ends when your baby is born. °· The third stage is the delivery of the organ that nourished your baby during pregnancy (placenta). °First stage of labor °As your due date gets closer, you may start to notice certain physical changes that mean labor is going to start soon. You may feel that your baby has dropped lower into your pelvis. You may experience irregular, often painless, contractions that go away when you walk around or lie down (Braxton Hicks contractions). This is also called false labor. °The first stage of labor begins when you start having contractions that come at regular (evenly spaced) intervals and your cervix starts to get thinner and wider in preparation for your baby to pass through. Birth care providers measure the dilation of your cervix in centimeters (cm). One centimeter is a little less than one-half of an inch. The first stage ends when your cervix is dilated to 10 cm. The first stage of labor is divided into three phases: °· Early phase. °· Active phase. °· Transitional phase. °The length of the first stage of labor varies. It may be longer if this is your first pregnancy. You may spend most of this stage at home trying to relax and stay comfortable. °How does this affect me? °During the first stage of labor, you will move through three phases. °What happens in the early phase? °· You will start to have  regular contractions that last 30-60 seconds. Contractions may come every 5-20 minutes. Keep track of your contractions and call your birth care provider. °· Your water may break during this phase. °· You may notice a clear or slightly bloody discharge of mucus (mucus plug) from your vagina. °· Your cervix will dilate to 3-6 cm. °What happens in the active phase? °The active phase usually lasts 3-5 hours. You may go to the hospital or birth center around this time. During the active phase: °· Your contractions will become stronger, longer, and more uncomfortable. °· Your contractions may last 45-90 seconds and come every 3-5 minutes. °· You may feel lower back pain. °· Your birth care providers may examine your cervix and feel your belly to find the position of your baby. °· You may have a monitor strapped to your belly to measure your contractions and your baby's heart rate. °· You may start using your pain management options. °· Your cervix may be dilated to 6 cm and may start to dilate more quickly. °What happens in the transitional phase? °The transitional phase typically lasts from 30 minutes to 2 hours. At the end of this phase, your cervix will be fully dilated to 10 cm. During the transitional phase: °· Contractions will get stronger and longer. °· Contractions may last 60-90 seconds and come less than 2 minutes apart. °· You may feel hot flashes, chills, or nausea. °How does this affect my baby? °During the first stage of labor, your baby will   gradually move down into your birth canal. °Follow these instructions at home and in the hospital or birth center: ° °· When labor first begins, try to stay calm. You are still in the early phase. If it is night, try to get some sleep. If it is day, try to relax and save your energy. You may want to make some calls and get ready to go to the hospital or birth center. °· When you are in the early phase, try these methods to help ease discomfort: °? Deep breathing and  muscle relaxation. °? Taking a walk. °? Taking a warm bath or shower. °· Drink some fluids and have a light snack if you feel like it. °· Keep track of your contractions. °· Based on the plan you created with your birth care provider, call when your contractions indicate it is time. °· If your water breaks, note the time, color, and odor of the fluid. °· When you are in the active phase, do your breathing exercises and rely on your support people and your team of birth care providers. °Contact a health care provider if: °· Your contractions are strong and regular. °· You have lower back pain or cramping. °· Your water breaks. °· You lose your mucus plug. °Get help right away if you: °· Have a severe headache that does not go away. °· Have changes in your vision. °· Have severe pain in your upper belly. °· Do not feel the baby move. °· Have bright red bleeding. °Summary °· The first stage of labor starts when true labor begins, and it ends when your cervix is dilated to 10 cm. °· The first stage of labor has three phases: early, active, and transitional. °· Your baby moves into the birth canal during the first stage of labor. °· You may have contractions that become stronger and longer. You may also lose your mucus plug and have your water break. °· Call your birth care provider when your contractions are frequent and strong enough to go to the hospital or birth center. °This information is not intended to replace advice given to you by your health care provider. Make sure you discuss any questions you have with your health care provider. °Document Released: 10/10/2017 Document Revised: 04/16/2018 Document Reviewed: 10/10/2017 °Elsevier Interactive Patient Education © 2019 [employer] ° °

## 2018-09-20 NOTE — Telephone Encounter (Signed)
Preadmission screen  

## 2018-09-20 NOTE — MAU Note (Signed)
Pt left ama.  Paper signed

## 2018-09-21 ENCOUNTER — Encounter (HOSPITAL_COMMUNITY): Payer: Self-pay

## 2018-09-21 DIAGNOSIS — O99213 Obesity complicating pregnancy, third trimester: Secondary | ICD-10-CM | POA: Diagnosis not present

## 2018-09-21 DIAGNOSIS — Z3A38 38 weeks gestation of pregnancy: Secondary | ICD-10-CM | POA: Diagnosis not present

## 2018-09-28 ENCOUNTER — Encounter (HOSPITAL_COMMUNITY)
Admission: RE | Admit: 2018-09-28 | Discharge: 2018-09-28 | Disposition: A | Discharge status: Home / Self Care | Payer: 59 | Source: Ambulatory Visit | Attending: Obstetrics and Gynecology | Admitting: Obstetrics and Gynecology

## 2018-09-28 LAB — CBC
HCT: 39.3 % (ref 36.0–46.0)
Hemoglobin: 12.8 g/dL (ref 12.0–15.0)
MCH: 27.5 pg (ref 26.0–34.0)
MCHC: 32.6 g/dL (ref 30.0–36.0)
MCV: 84.3 fL (ref 80.0–100.0)
Platelets: 185 10*3/uL (ref 150–400)
RBC: 4.66 MIL/uL (ref 3.87–5.11)
RDW: 16 % — AB (ref 11.5–15.5)
WBC: 5.7 10*3/uL (ref 4.0–10.5)
nRBC: 0 % (ref 0.0–0.2)

## 2018-09-28 LAB — TYPE AND SCREEN
ABO/RH(D): B POS
Antibody Screen: NEGATIVE

## 2018-09-28 NOTE — Patient Instructions (Signed)
SHEY YOTT  09/28/2018   Your procedure is scheduled on:  09/29/18  Enter through the Main Entrance of [facility] at [place] up the phone at the desk and dial [phone]  Call this number if you have problems the morning of surgery:[phone]  Remember:   Do not eat food:(After Midnight) Desps de medianoche.  Do not drink clear liquids: (After Midnight) Desps de medianoche.  Take these medicines the morning of surgery with A SIP OF WATER: bring your inhaler with you please   Do not wear jewelry, make-up or nail polish.  Do not wear lotions, powders, or perfumes. Do not wear deodorant.  Do not shave 48 hours prior to surgery.  Do not bring valuables to the hospital.  [facility] is not   responsible for any belongings or valuables brought to the hospital.  Contacts, dentures or bridgework may not be worn into surgery.  Leave suitcase in the car. After surgery it may be brought to your room.  For patients admitted to the hospital, checkout time is 11:00 AM the day of              discharge.    N/A   Please read over the following fact sheets that you were given:   Surgical Site Infection Prevention

## 2018-09-29 ENCOUNTER — Inpatient Hospital Stay (HOSPITAL_COMMUNITY): Payer: 59

## 2018-09-29 ENCOUNTER — Encounter (HOSPITAL_COMMUNITY): Payer: Self-pay | Admitting: *Deleted

## 2018-09-29 ENCOUNTER — Encounter (HOSPITAL_COMMUNITY)
Admission: RE | Disposition: A | Discharge status: Home / Self Care | Payer: Self-pay | Source: Home / Self Care | Attending: Obstetrics and Gynecology

## 2018-09-29 ENCOUNTER — Inpatient Hospital Stay (HOSPITAL_COMMUNITY)
Admission: RE | Admit: 2018-09-29 | Discharge: 2018-10-01 | DRG: 784 | Disposition: A | Discharge status: Home / Self Care | Payer: 59 | Attending: Obstetrics and Gynecology | Admitting: Obstetrics and Gynecology

## 2018-09-29 DIAGNOSIS — A6000 Herpesviral infection of urogenital system, unspecified: Secondary | ICD-10-CM | POA: Diagnosis present

## 2018-09-29 DIAGNOSIS — Z98891 History of uterine scar from previous surgery: Secondary | ICD-10-CM

## 2018-09-29 DIAGNOSIS — Z3A00 Weeks of gestation of pregnancy not specified: Secondary | ICD-10-CM | POA: Diagnosis not present

## 2018-09-29 DIAGNOSIS — O99214 Obesity complicating childbirth: Secondary | ICD-10-CM | POA: Diagnosis present

## 2018-09-29 DIAGNOSIS — D259 Leiomyoma of uterus, unspecified: Secondary | ICD-10-CM | POA: Diagnosis present

## 2018-09-29 DIAGNOSIS — O34211 Maternal care for low transverse scar from previous cesarean delivery: Secondary | ICD-10-CM | POA: Diagnosis not present

## 2018-09-29 DIAGNOSIS — O34219 Maternal care for unspecified type scar from previous cesarean delivery: Secondary | ICD-10-CM | POA: Diagnosis not present

## 2018-09-29 DIAGNOSIS — Z302 Encounter for sterilization: Secondary | ICD-10-CM | POA: Diagnosis not present

## 2018-09-29 DIAGNOSIS — Z3A39 39 weeks gestation of pregnancy: Secondary | ICD-10-CM | POA: Diagnosis not present

## 2018-09-29 DIAGNOSIS — O9832 Other infections with a predominantly sexual mode of transmission complicating childbirth: Secondary | ICD-10-CM | POA: Diagnosis present

## 2018-09-29 DIAGNOSIS — O3413 Maternal care for benign tumor of corpus uteri, third trimester: Secondary | ICD-10-CM | POA: Diagnosis present

## 2018-09-29 LAB — RPR: RPR Ser Ql: NONREACTIVE

## 2018-09-29 SURGERY — Surgical Case
Anesthesia: Spinal | Site: Abdomen | Laterality: Bilateral | Wound class: Clean Contaminated

## 2018-09-29 MED ORDER — NALOXONE HCL 4 MG/10ML IJ SOLN
1.0000 ug/kg/h | INTRAVENOUS | Status: DC | PRN
  Filled 2018-09-29: qty 5

## 2018-09-29 MED ORDER — OXYTOCIN 10 UNIT/ML IJ SOLN
INTRAVENOUS | Status: DC | PRN
  Administered 2018-09-29: 40 [IU] via INTRAVENOUS

## 2018-09-29 MED ORDER — PRENATAL MULTIVITAMIN CH
1.0000 | ORAL_TABLET | Freq: Every day | ORAL | Status: DC
  Administered 2018-09-30: 1 via ORAL
  Filled 2018-09-29: qty 1

## 2018-09-29 MED ORDER — METOCLOPRAMIDE HCL 5 MG/ML IJ SOLN
INTRAMUSCULAR | Status: AC
  Filled 2018-09-29: qty 2

## 2018-09-29 MED ORDER — DEXAMETHASONE SODIUM PHOSPHATE 4 MG/ML IJ SOLN
INTRAMUSCULAR | Status: DC | PRN
  Administered 2018-09-29: 4 mg via INTRAVENOUS

## 2018-09-29 MED ORDER — PHENYLEPHRINE 8 MG IN D5W 100 ML (0.08MG/ML) PREMIX OPTIME
INJECTION | INTRAVENOUS | Status: DC | PRN
  Administered 2018-09-29: 60 ug/min via INTRAVENOUS

## 2018-09-29 MED ORDER — DEXAMETHASONE SODIUM PHOSPHATE 4 MG/ML IJ SOLN
INTRAMUSCULAR | Status: AC
  Filled 2018-09-29: qty 1

## 2018-09-29 MED ORDER — SCOPOLAMINE 1 MG/3DAYS TD PT72
1.0000 | MEDICATED_PATCH | Freq: Once | TRANSDERMAL | Status: DC
  Filled 2018-09-29: qty 1

## 2018-09-29 MED ORDER — WITCH HAZEL-GLYCERIN EX PADS: 1.0000 "application " | MEDICATED_PAD | CUTANEOUS | Status: DC | PRN

## 2018-09-29 MED ORDER — DIPHENHYDRAMINE HCL 25 MG PO CAPS
25.0000 mg | ORAL_CAPSULE | Freq: Four times a day (QID) | ORAL | Status: DC | PRN
  Administered 2018-09-30: 25 mg via ORAL
  Filled 2018-09-29: qty 1

## 2018-09-29 MED ORDER — DIBUCAINE 1 % RE OINT: 1.0000 "application " | TOPICAL_OINTMENT | RECTAL | Status: DC | PRN

## 2018-09-29 MED ORDER — SIMETHICONE 80 MG PO CHEW
80.0000 mg | CHEWABLE_TABLET | ORAL | Status: DC
  Administered 2018-09-29 – 2018-09-30 (×2): 80 mg via ORAL
  Filled 2018-09-29 (×2): qty 1

## 2018-09-29 MED ORDER — FENTANYL CITRATE (PF) 100 MCG/2ML IJ SOLN: 25.0000 ug | INTRAMUSCULAR | Status: DC | PRN

## 2018-09-29 MED ORDER — NALBUPHINE HCL 10 MG/ML IJ SOLN: 5.0000 mg | Freq: Once | INTRAMUSCULAR | Status: DC | PRN

## 2018-09-29 MED ORDER — KETOROLAC TROMETHAMINE 30 MG/ML IJ SOLN: 30.0000 mg | Freq: Four times a day (QID) | INTRAMUSCULAR | Status: AC | PRN

## 2018-09-29 MED ORDER — NALBUPHINE HCL 10 MG/ML IJ SOLN: 5.0000 mg | INTRAMUSCULAR | Status: DC | PRN

## 2018-09-29 MED ORDER — ACETAMINOPHEN 325 MG PO TABS
650.0000 mg | ORAL_TABLET | ORAL | Status: DC | PRN
  Administered 2018-09-30 (×4): 650 mg via ORAL
  Filled 2018-09-29 (×4): qty 2

## 2018-09-29 MED ORDER — LACTATED RINGERS IV SOLN: INTRAVENOUS | Status: DC

## 2018-09-29 MED ORDER — FENTANYL CITRATE (PF) 100 MCG/2ML IJ SOLN
INTRAMUSCULAR | Status: DC | PRN
  Administered 2018-09-29: 15 ug via INTRATHECAL

## 2018-09-29 MED ORDER — MORPHINE SULFATE (PF) 0.5 MG/ML IJ SOLN
INTRAMUSCULAR | Status: AC
  Filled 2018-09-29: qty 10

## 2018-09-29 MED ORDER — DIPHENHYDRAMINE HCL 25 MG PO CAPS: 25.0000 mg | ORAL_CAPSULE | ORAL | Status: DC | PRN

## 2018-09-29 MED ORDER — SIMETHICONE 80 MG PO CHEW
80.0000 mg | CHEWABLE_TABLET | Freq: Three times a day (TID) | ORAL | Status: DC
  Administered 2018-09-30 – 2018-10-01 (×4): 80 mg via ORAL
  Filled 2018-09-29 (×4): qty 1

## 2018-09-29 MED ORDER — TETANUS-DIPHTH-ACELL PERTUSSIS 5-2.5-18.5 LF-MCG/0.5 IM SUSP: 0.5000 mL | Freq: Once | INTRAMUSCULAR | Status: DC

## 2018-09-29 MED ORDER — KETOROLAC TROMETHAMINE 30 MG/ML IJ SOLN
INTRAMUSCULAR | Status: AC
  Filled 2018-09-29: qty 1

## 2018-09-29 MED ORDER — BUPIVACAINE IN DEXTROSE 0.75-8.25 % IT SOLN
INTRATHECAL | Status: DC | PRN
  Administered 2018-09-29: 1.4 mL via INTRATHECAL

## 2018-09-29 MED ORDER — ENOXAPARIN SODIUM 60 MG/0.6ML ~~LOC~~ SOLN
60.0000 mg | SUBCUTANEOUS | Status: DC
  Administered 2018-09-30 – 2018-10-01 (×2): 60 mg via SUBCUTANEOUS
  Filled 2018-09-29 (×2): qty 0.6

## 2018-09-29 MED ORDER — SENNOSIDES-DOCUSATE SODIUM 8.6-50 MG PO TABS
2.0000 | ORAL_TABLET | ORAL | Status: DC
  Administered 2018-09-29 – 2018-09-30 (×2): 2 via ORAL
  Filled 2018-09-29 (×2): qty 2

## 2018-09-29 MED ORDER — MEPERIDINE HCL 25 MG/ML IJ SOLN: 6.2500 mg | INTRAMUSCULAR | Status: DC | PRN

## 2018-09-29 MED ORDER — OXYTOCIN 10 UNIT/ML IJ SOLN
INTRAMUSCULAR | Status: AC
  Filled 2018-09-29: qty 4

## 2018-09-29 MED ORDER — COCONUT OIL OIL: 1.0000 "application " | TOPICAL_OIL | Status: DC | PRN

## 2018-09-29 MED ORDER — KETOROLAC TROMETHAMINE 30 MG/ML IJ SOLN
30.0000 mg | Freq: Four times a day (QID) | INTRAMUSCULAR | Status: AC | PRN
  Administered 2018-09-29: 30 mg via INTRAMUSCULAR

## 2018-09-29 MED ORDER — MENTHOL 3 MG MT LOZG: 1.0000 | LOZENGE | OROMUCOSAL | Status: DC | PRN

## 2018-09-29 MED ORDER — PHENYLEPHRINE HCL 10 MG/ML IJ SOLN
INTRAMUSCULAR | Status: DC | PRN
  Administered 2018-09-29 (×2): 40 ug via INTRAVENOUS

## 2018-09-29 MED ORDER — METOCLOPRAMIDE HCL 5 MG/ML IJ SOLN
10.0000 mg | Freq: Once | INTRAMUSCULAR | Status: AC | PRN
  Administered 2018-09-29: 10 mg via INTRAVENOUS

## 2018-09-29 MED ORDER — LACTATED RINGERS IV SOLN
INTRAVENOUS | Status: DC
  Administered 2018-09-29 (×2): via INTRAVENOUS

## 2018-09-29 MED ORDER — OXYCODONE HCL 5 MG PO TABS: 5.0000 mg | ORAL_TABLET | ORAL | Status: DC | PRN

## 2018-09-29 MED ORDER — SIMETHICONE 80 MG PO CHEW: 80.0000 mg | CHEWABLE_TABLET | ORAL | Status: DC | PRN

## 2018-09-29 MED ORDER — LACTATED RINGERS IV SOLN
INTRAVENOUS | Status: DC
  Administered 2018-09-29 (×3): via INTRAVENOUS

## 2018-09-29 MED ORDER — FENTANYL CITRATE (PF) 100 MCG/2ML IJ SOLN
INTRAMUSCULAR | Status: AC
  Filled 2018-09-29: qty 2

## 2018-09-29 MED ORDER — SODIUM CHLORIDE 0.9 % IR SOLN
Status: DC | PRN
  Administered 2018-09-29: 1

## 2018-09-29 MED ORDER — ONDANSETRON HCL 4 MG/2ML IJ SOLN
INTRAMUSCULAR | Status: DC | PRN
  Administered 2018-09-29: 4 mg via INTRAVENOUS

## 2018-09-29 MED ORDER — ONDANSETRON HCL 4 MG/2ML IJ SOLN
INTRAMUSCULAR | Status: AC
  Filled 2018-09-29: qty 2

## 2018-09-29 MED ORDER — MORPHINE SULFATE (PF) 0.5 MG/ML IJ SOLN
INTRAMUSCULAR | Status: DC | PRN
  Administered 2018-09-29: .15 mg via INTRATHECAL

## 2018-09-29 MED ORDER — CEFAZOLIN SODIUM-DEXTROSE 2-4 GM/100ML-% IV SOLN
2.0000 g | INTRAVENOUS | Status: AC
  Administered 2018-09-29: 2 g via INTRAVENOUS
  Filled 2018-09-29: qty 100

## 2018-09-29 MED ORDER — STERILE WATER FOR IRRIGATION IR SOLN
Status: DC | PRN
  Administered 2018-09-29: 1

## 2018-09-29 MED ORDER — LACTATED RINGERS IV SOLN
INTRAVENOUS | Status: DC | PRN
  Administered 2018-09-29: 10:00:00 via INTRAVENOUS

## 2018-09-29 MED ORDER — DIPHENHYDRAMINE HCL 50 MG/ML IJ SOLN: 12.5000 mg | INTRAMUSCULAR | Status: DC | PRN

## 2018-09-29 MED ORDER — ZOLPIDEM TARTRATE 5 MG PO TABS: 5.0000 mg | ORAL_TABLET | Freq: Every evening | ORAL | Status: DC | PRN

## 2018-09-29 MED ORDER — OXYTOCIN 40 UNITS IN NORMAL SALINE INFUSION - SIMPLE MED: 2.5000 [IU]/h | INTRAVENOUS | Status: AC

## 2018-09-29 MED ORDER — PHENYLEPHRINE 40 MCG/ML (10ML) SYRINGE FOR IV PUSH (FOR BLOOD PRESSURE SUPPORT)
PREFILLED_SYRINGE | INTRAVENOUS | Status: AC
  Filled 2018-09-29: qty 10

## 2018-09-29 SURGICAL SUPPLY — 42 items
APL SKNCLS STERI-STRIP NONHPOA (GAUZE/BANDAGES/DRESSINGS) ×1
BENZOIN TINCTURE PRP APPL 2/3 (GAUZE/BANDAGES/DRESSINGS) ×2 IMPLANT
CHLORAPREP W/TINT 26ML (MISCELLANEOUS) ×2 IMPLANT
CLAMP CORD UMBIL (MISCELLANEOUS) IMPLANT
CLOSURE STERI STRIP 1/2 X4 (GAUZE/BANDAGES/DRESSINGS) ×1 IMPLANT
CLOTH BEACON ORANGE TIMEOUT ST (SAFETY) ×2 IMPLANT
DRSG OPSITE POSTOP 4X10 (GAUZE/BANDAGES/DRESSINGS) ×2 IMPLANT
ELECT REM PT RETURN 9FT ADLT (ELECTROSURGICAL) ×2
ELECTRODE REM PT RTRN 9FT ADLT (ELECTROSURGICAL) ×1 IMPLANT
EXTRACTOR VACUUM M CUP 4 TUBE (SUCTIONS) IMPLANT
GLOVE BIO SURGEON STRL SZ7.5 (GLOVE) ×2 IMPLANT
GLOVE BIOGEL PI IND STRL 7.0 (GLOVE) ×2 IMPLANT
GLOVE BIOGEL PI IND STRL 7.5 (GLOVE) ×1 IMPLANT
GLOVE BIOGEL PI INDICATOR 7.0 (GLOVE) ×2
GLOVE BIOGEL PI INDICATOR 7.5 (GLOVE) ×1
GOWN STRL REUS W/TWL LRG LVL3 (GOWN DISPOSABLE) ×6 IMPLANT
HEMOSTAT ARISTA ABSORB 3G PWDR (HEMOSTASIS) ×1 IMPLANT
HOVERMATT SINGLE USE (MISCELLANEOUS) ×1 IMPLANT
KIT ABG SYR 3ML LUER SLIP (SYRINGE) IMPLANT
NDL HYPO 25X5/8 SAFETYGLIDE (NEEDLE) IMPLANT
NEEDLE HYPO 22GX1.5 SAFETY (NEEDLE) IMPLANT
NEEDLE HYPO 25X5/8 SAFETYGLIDE (NEEDLE) IMPLANT
NS IRRIG 1000ML POUR BTL (IV SOLUTION) ×2 IMPLANT
PACK C SECTION WH (CUSTOM PROCEDURE TRAY) ×2 IMPLANT
PAD OB MATERNITY 4.3X12.25 (PERSONAL CARE ITEMS) ×2 IMPLANT
PENCIL SMOKE EVAC W/HOLSTER (ELECTROSURGICAL) ×2 IMPLANT
RETRACTOR TRAXI PANNICULUS (MISCELLANEOUS) IMPLANT
RTRCTR C-SECT PINK 25CM LRG (MISCELLANEOUS) ×2 IMPLANT
STRIP CLOSURE SKIN 1/2X4 (GAUZE/BANDAGES/DRESSINGS) ×2 IMPLANT
SUT CHROMIC 2 0 CT 1 (SUTURE) ×2 IMPLANT
SUT MNCRL AB 3-0 PS2 27 (SUTURE) ×3 IMPLANT
SUT PLAIN 2 0 XLH (SUTURE) ×2 IMPLANT
SUT VIC AB 0 CT1 36 (SUTURE) ×2 IMPLANT
SUT VIC AB 0 CTX 36 (SUTURE) ×8
SUT VIC AB 0 CTX36XBRD ANBCTRL (SUTURE) ×3 IMPLANT
SUT VIC AB 2-0 SH 27 (SUTURE) ×4
SUT VIC AB 2-0 SH 27XBRD (SUTURE) ×2 IMPLANT
SYR CONTROL 10ML LL (SYRINGE) IMPLANT
TOWEL OR 17X24 6PK STRL BLUE (TOWEL DISPOSABLE) ×2 IMPLANT
TRAXI PANNICULUS RETRACTOR (MISCELLANEOUS) ×1
TRAY FOLEY W/BAG SLVR 14FR LF (SET/KITS/TRAYS/PACK) ×2 IMPLANT
WATER STERILE IRR 1000ML POUR (IV SOLUTION) ×2 IMPLANT

## 2018-09-29 NOTE — Interval H&P Note (Signed)
History and Physical Interval Note:  09/29/2018 9:23 AM  Brenda [name]  has presented today for surgery, with the diagnosis of Prior Cesarean Section with Desire for Surgical Sterilization  The various methods of treatment have been discussed with the patient and family. After consideration of risks, benefits and other options for treatment, the patient has consented to  Procedure(s): REPEAT CESAREAN SECTION WITH BILATERAL TUBAL LIGATION (Bilateral)/POSSIBLE BILATERAL SALPINGECTOMY as a surgical intervention .  The patient's history has been reviewed, patient examined, no change in status, stable for surgery.  I have reviewed the patient's chart and labs.  Questions were answered to the patient's satisfaction.     Delice Lesch

## 2018-09-29 NOTE — Anesthesia Postprocedure Evaluation (Signed)
Anesthesia Post Note  Patient: Brenda [name]  Procedure(s) Performed: REPEAT CESAREAN SECTION WITH BILATERAL TUBAL LIGATION (Bilateral Abdomen)     Patient location during evaluation: Mother Baby Anesthesia Type: Spinal Level of consciousness: awake, awake and alert and oriented Pain management: pain level controlled Vital Signs Assessment: post-procedure vital signs reviewed and stable Respiratory status: spontaneous breathing, nonlabored ventilation and respiratory function stable Cardiovascular status: stable Postop Assessment: no headache, no backache, patient able to bend at knees, no apparent nausea or vomiting and adequate PO intake Anesthetic complications: no    Last Vitals:  Vitals:   09/29/18 1343 09/29/18 1505  BP: 106/61 117/77  Pulse: 64 71  Resp: 18 20  Temp: 36.5 C (!) 36.3 C  SpO2: 97% 98%    Last Pain:  Vitals:   09/29/18 1505  TempSrc: Oral  PainSc:    Pain Goal:                   [name]

## 2018-09-29 NOTE — H&P (Signed)
Brenda [name] is a 33 y.o. female presenting for elective repeat cesarean with bilateral tubal ligation. OB History    Gravida  3   Para  2   Term  2   Preterm  0   AB  0   Living  2     SAB  0   TAB  0   Ectopic  0   Multiple  0   Live Births  2          Past Medical History:  Diagnosis Date  . Abnormal Pap smear 2007   colpo  . Asthma    childhood - PRN inhaler  . Fibroid    4 fibroids   . Gestational diabetes    first pregnancy, diet controlled  . H/O chlamydia infection 2010  . H/O varicella   . Mass of neck 03/02/2014  . Obese   . Rubella non-immune status 01/11/2012  . Vaginal Pap smear, abnormal    biopsy - normal    Past Surgical History:  Procedure Laterality Date  . CESAREAN SECTION N/A 02/28/2016   Procedure: CESAREAN SECTION;  Surgeon: [name], MD;  Location: [place];  Service: Obstetrics;  Laterality: N/A;   Family History: family history includes Alcohol abuse in her father; Asthma in her cousin; Cancer in her cousin; Diabetes in her maternal aunt and maternal grandmother; Heart disease in her cousin, maternal aunt, maternal grandmother, and paternal grandmother; Hypertension in her father, maternal grandmother, mother, and paternal grandmother; Learning disabilities in her cousin; Mental illness in her maternal aunt; Stroke in her maternal grandmother; Thrombophlebitis in her maternal grandmother. Social History:  reports that she has never smoked. She has never used smokeless tobacco. She reports previous alcohol use. She reports that she does not use drugs.     Maternal Diabetes: No Genetic Screening: Normal Maternal Ultrasounds/Referrals: Normal Fetal Ultrasounds or other Referrals:  None Maternal Substance Abuse:  No Significant Maternal Medications:  None Significant Maternal Lab Results:  None Other Comments:  BPPs from 32 weeks for maternal obesity  Review of Systems  All other systems reviewed and are  negative.  Maternal Medical History:  Fetal activity: Perceived fetal activity is normal.   Last perceived fetal movement was within the past hour.    Prenatal Complications - Diabetes: none.      Blood pressure 119/88, pulse (!) 103, temperature 98.4 F (36.9 C), temperature source Oral, resp. rate 18, height 5\' 3"  (1.6 m), weight 120.7 kg, last menstrual period 01/18/2018, SpO2 98 %, not currently breastfeeding. Maternal Exam:  Abdomen: Surgical scars: low transverse.   Fundal height is Size=dates.   Estimated fetal weight is 8.5lbs .   Fetal presentation: vertex     Physical Exam  Vitals reviewed. Constitutional: She is oriented to person, place, and time. She appears well-developed and well-nourished.  HENT:  Head: Normocephalic and atraumatic.  Right Ear: External ear normal.  Eyes: Pupils are equal, round, and reactive to light.  Cardiovascular: Normal rate, regular rhythm and normal heart sounds.  Respiratory: Effort normal and breath sounds normal. No respiratory distress.  GI: There is no abdominal tenderness.  Musculoskeletal: Normal range of motion.  Neurological: She is alert and oriented to person, place, and time.  Skin: Skin is warm and dry.  Psychiatric: She has a normal mood and affect. Her behavior is normal. Judgment and thought content normal.    Prenatal labs: ABO, Rh: --/--/B POS (02/19 1040) Antibody: NEG (02/19 1040) Rubella: Immune (08/15 0000)  RPR: Non Reactive (02/19 1040)  HBsAg: Negative (08/15 0000)  HIV: Non-reactive (08/15 0000)  GBS:   Negative   Assessment/Plan: 33 y.o. 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 Mancel Bale Admit to [place] 09/29/2018, 9:05 AM

## 2018-09-29 NOTE — Transfer of Care (Signed)
Immediate Anesthesia Transfer of Care Note  Patient: Brenda [name]  Procedure(s) Performed: REPEAT CESAREAN SECTION WITH BILATERAL TUBAL LIGATION (Bilateral Abdomen)  Patient Location: PACU  Anesthesia Type:Spinal  Level of Consciousness: awake, alert , oriented and patient cooperative  Airway & Oxygen Therapy: Patient Spontanous Breathing  Post-op Assessment: Report given to RN and Post -op Vital signs reviewed and stable  Post vital signs: Reviewed and stable  Last Vitals:  Vitals Value Taken Time  BP    Temp    Pulse 85 09/29/2018 11:30 AM  Resp    SpO2 98 % 09/29/2018 11:30 AM  Vitals shown include unvalidated device data.  Last Pain:  Vitals:   09/29/18 0756  TempSrc: Oral         Complications: No apparent anesthesia complications

## 2018-09-29 NOTE — Anesthesia Preprocedure Evaluation (Signed)
Anesthesia Evaluation  Patient identified by MRN, date of birth, ID band Patient awake    Reviewed: Allergy & Precautions, NPO status , Patient's Chart, lab work & pertinent test results  Airway Mallampati: II  TM Distance: >3 FB Neck ROM: Full    Dental no notable dental hx.    Pulmonary neg pulmonary ROS,    Pulmonary exam normal breath sounds clear to auscultation       Cardiovascular negative cardio ROS Normal cardiovascular exam Rhythm:Regular Rate:Normal     Neuro/Psych negative neurological ROS  negative psych ROS   GI/Hepatic negative GI ROS, Neg liver ROS,   Endo/Other  neg diabetesMorbid obesity  Renal/GU negative Renal ROS  negative genitourinary   Musculoskeletal negative musculoskeletal ROS (+)   Abdominal   Peds negative pediatric ROS (+)  Hematology negative hematology ROS (+)   Anesthesia Other Findings   Reproductive/Obstetrics negative OB ROS                             Anesthesia Physical Anesthesia Plan  ASA: III  Anesthesia Plan: Combined Spinal and Epidural   Post-op Pain Management:    Induction:   PONV Risk Score and Plan: 3 and Ondansetron and Treatment may vary due to age or medical condition  Airway Management Planned: Natural Airway  Additional Equipment:   Intra-op Plan:   Post-operative Plan:   Informed Consent: I have reviewed the patients History and Physical, chart, labs and discussed the procedure including the risks, benefits and alternatives for the proposed anesthesia with the patient or authorized representative who has indicated his/her understanding and acceptance.     Dental advisory given  Plan Discussed with:   Anesthesia Plan Comments:         Anesthesia Quick Evaluation

## 2018-09-29 NOTE — Op Note (Signed)
Cesarean Section Procedure Note  Indications: P1 at 55 5/7wks with h/o c-section presenting today for elective repeat c-section and sterilization  Pre-operative Diagnosis: Prior Cesarean Section with Desire for Surgical Sterilization   Post-operative Diagnosis: Prior Cesarean Section with Desire for Surgical Sterilization  Procedure: REPEAT [place] LIGATION  Surgeon: [name], MD    Assistants: Ranee Gosselin, CNM  Anesthesia: Regional  Anesthesiologist: Montez Hageman, MD   Procedure Details  The patient was taken to the operating room after the risks, benefits, complications, treatment options, and expected outcomes were discussed with the patient.  The patient concurred with the proposed plan, giving informed consent which was signed and witnessed. The patient was taken to Operating Room Nine/C-section Suite, identified as Brenda [name] and the procedure verified as C-Section Delivery. A Time Out was held and the above information confirmed.  After induction of anesthesia by obtaining a surgical level via the spinal, the patient was prepped and draped in the usual sterile manner. A Pfannenstiel skin incision was made and carried down through the subcutaneous tissue to the underlying layer of fascia.  The fascia was incised bilaterally and extended transversely bilaterally with the Mayo scissors. Kocher clamps were placed on the inferior aspect of the fascial incision and the underlying rectus muscle was separated from the fascia. The same was done on the superior aspect of the fascial incision.  The peritoneum was identified, entered bluntly and extended manually. An Alexis self-retaining retractor was placed.  The utero-vesical peritoneal reflection was incised transversely and the bladder flap was bluntly freed from the lower uterine segment. A low transverse uterine incision was made with the scalpel and extended bilaterally with the bandage scissors.   The infant was delivered in vertex position without difficulty. After the umbilical cord was clamped and cut, the infant was handed to the awaiting pediatricians.  Cord blood was obtained for evaluation.  The placenta was removed intact and appeared to be within normal limits. The uterus was cleared of all clots and debris. The uterine incision was closed with running interlocking sutures of 0 Vicryl and a second imbricating layer was performed as well.  Several additional figure of eight stitches were placed for hemostasis near the rt angle.  Bilateral tubes and ovaries appeared to be within normal limits.  Good hemostasis was noted.  Copious irrigation was performed until clear.  The uterus was exteriorized and the left fallopian tube grasped in the midportion with a babcock after carrying it out to its fimbriated end and clamped for most of its length with a kelly, cut and suture ligated x 2 and ligated x 1 with 2-0 vicryl.  The same was done on the contralateral side.  The remaining pedicle cauterized with the bovie. The same was done on the contralateral side.  The peritoneum was repaired with 2-0 chromic via a running suture.  The fascia was reapproximated with a running suture of 0 Vicryl. The subcutaneous tissue was reapproximated with 3 interrupted sutures of 2-0 plain.  The skin was reapproximated with a subcuticular suture of 3-0 monocryl.  Steristrips were applied.  Instrument, sponge, and needle counts were correct prior to abdominal closure and at the conclusion of the case.  The patient was awaiting transfer to the recovery room in good condition.  Findings: Live female infant with Apgars 8 at one minute and 9 at five minutes.  Normal appearing bilateral ovaries and fallopian tubes were noted.  Estimated Blood Loss:  457 ml  Drains: foley to gravity 300 cc         Total IV Fluids: 2500 ml         Specimens to Pathology: Placenta and Bilateral Fallopian Tubes         Complications:   None; patient tolerated the procedure well.         Disposition: PACU - hemodynamically stable.         Condition: stable  Attending Attestation: I performed the procedure.

## 2018-09-29 NOTE — Anesthesia Procedure Notes (Signed)
Epidural Patient location during procedure: OR  Staffing Anesthesiologist: Montez Hageman, MD Performed: anesthesiologist   Preanesthetic Checklist Completed: patient identified, site marked, surgical consent, pre-op evaluation, timeout performed, IV checked, risks and benefits discussed and monitors and equipment checked  Epidural Patient position: sitting Prep: DuraPrep Patient monitoring: heart rate, continuous pulse ox, blood pressure and cardiac monitor Approach: midline Location: L3-L4 Injection technique: LOR air  Needle:  Needle type: Hustead  Needle gauge: 18 G Needle length: 9 cm and 9 Needle insertion depth: 8 cm Catheter type: closed end flexible Catheter size: 20 Guage Catheter at skin depth: 13 cm  Assessment Events: blood not aspirated  Additional Notes CSE in the usual manner through epidural needle.  Patient tolerated the insertion well without complications.Reason for block:surgical anesthesia

## 2018-09-30 LAB — CBC
HCT: 34.1 % — ABNORMAL LOW (ref 36.0–46.0)
Hemoglobin: 11.3 g/dL — ABNORMAL LOW (ref 12.0–15.0)
MCH: 28 pg (ref 26.0–34.0)
MCHC: 33.1 g/dL (ref 30.0–36.0)
MCV: 84.6 fL (ref 80.0–100.0)
Platelets: 156 10*3/uL (ref 150–400)
RBC: 4.03 MIL/uL (ref 3.87–5.11)
RDW: 15.8 % — ABNORMAL HIGH (ref 11.5–15.5)
WBC: 8.3 10*3/uL (ref 4.0–10.5)
nRBC: 0 % (ref 0.0–0.2)

## 2018-09-30 LAB — BIRTH TISSUE RECOVERY COLLECTION (PLACENTA DONATION)

## 2018-09-30 MED ORDER — INFLUENZA VAC SPLIT QUAD 0.5 ML IM SUSY: 0.5000 mL | PREFILLED_SYRINGE | INTRAMUSCULAR | Status: DC

## 2018-09-30 MED ORDER — IBUPROFEN 600 MG PO TABS
600.0000 mg | ORAL_TABLET | Freq: Four times a day (QID) | ORAL | Status: DC
  Administered 2018-09-30 – 2018-10-01 (×3): 600 mg via ORAL
  Filled 2018-09-30 (×3): qty 1

## 2018-09-30 NOTE — Progress Notes (Signed)
Brenda [name] [phone] Postpartum Postoperative Day # 1  Brenda [name], [place], [redacted]w[redacted]d, S/P Repeat LT Cesarean Section with BTL due to elective.   Subjective: Patient up ad lib, denies syncope or dizziness. Reports consuming regular diet without issues and denies N/V. Patient reports 0 bowel movement + passing flatus.  Denies issues with urination and reports bleeding is "light."  Patient is breastfeeding and reports going well.  Desires BTL: al;ready performed for postpartum contraception.  Pain is being appropriately managed with use of po meds. Baby Female need out pt circ.   Objective: Patient Vitals for the past 24 hrs:  BP Temp Temp src Pulse Resp SpO2  09/30/18 1000 117/74 98.1 F (36.7 C) - - 20 99 %  09/30/18 0539 116/63 97.6 F (36.4 C) Oral 74 18 96 %  09/29/18 2120 131/72 97.9 F (36.6 C) Oral 77 18 97 %  09/29/18 1720 - - - - - 97 %  09/29/18 1505 117/77 (!) 97.4 F (36.3 C) Oral 71 20 98 %  09/29/18 1343 106/61 97.7 F (36.5 C) Oral 64 18 97 %  09/29/18 1230 105/79 97.9 F (36.6 C) - 79 17 98 %  09/29/18 1215 106/71 - - 80 16 98 %  09/29/18 1200 110/72 97.8 F (36.6 C) Oral 80 13 100 %  09/29/18 1146 110/61 - - 89 19 98 %  09/29/18 1145 - - - - - 99 %  09/29/18 1133 112/61 (!) 97.5 F (36.4 C) Oral 93 19 95 %  09/29/18 1130 - - - - - 98 %     Physical Exam:  General: alert, cooperative, appears stated age and no distress Mood/Affect: happy Lungs: clear to auscultation, no wheezes, rales or rhonchi, symmetric air entry.  Heart: normal rate, regular rhythm, normal S1, S2, no murmurs, rubs, clicks or gallops. Breast: breasts appear normal, no suspicious masses, no skin or nipple changes or axillary nodes. Abdomen:  + bowel sounds, soft, non-tender Incision: healing well, no significant drainage, no dehiscence, no significant erythema, Honeycomb dressing  Uterine Fundus: firm, involution -2 Lochia: appropriate Skin: Warm, Dry. DVT Evaluation: No evidence of  DVT seen on physical exam. Negative Homan's sign. No cords or calf tenderness. No significant calf/ankle edema.  Labs: Recent Labs    09/28/18 1040 09/30/18 0607  HGB 12.8 11.3*  HCT 39.3 34.1*  WBC 5.7 8.3    CBG (last 3)  No results for input(s): GLUCAP in the last 72 hours.   I/O: I/O last 3 completed shifts: In: 2500 [I.V.:2500] Out: 9030 [Urine:2250; Emesis/NG output:1000; Blood:507]   Assessment Postpartum Postoperative Day # 1. Brenda [name], [phone], [redacted]w[redacted]d, S/P repeat LT Cesarean Section with BTL due to elective.  Pt stable. -2 Involution. breastFeeding. Hemodynamically Stable, post op hgb was 11.3 from 12.8.  Plan: Continue other mgmt as ordered VTE Prophylactics: SCD, ambulated as tolerates.  Pain control: Motrin/Tylenol/Narcotics PRN Plan for discharge tomorrow, Breastfeeding and Lactation consult  Baby Female needs out pt circ.  Dr. Charlesetta Garibaldi to be updated on patient status  [facility] NP-C, CNM 09/30/2018, 10:30 AM

## 2018-10-01 ENCOUNTER — Encounter (HOSPITAL_COMMUNITY): Payer: Self-pay | Admitting: Obstetrics and Gynecology

## 2018-10-01 ENCOUNTER — Other Ambulatory Visit: Payer: Self-pay

## 2018-10-01 NOTE — Discharge Summary (Signed)
OB Discharge Summary     Patient Name: BRANDON SCARBROUGH DOB: 09/07/1985 MRN: [id number]  Date of admission: 09/29/2018 Delivering MD: [name]   Date of discharge: 10/01/2018  Admitting diagnosis: Prior Cesarean Section with Desire for Surgical Sterilization Intrauterine pregnancy: [redacted]w[redacted]d     Secondary diagnosis:  Active Problems:   Status post repeat low transverse cesarean section  Additional problems: asthma, fibroid, HSV, obesity.     Discharge diagnosis: Term Pregnancy Delivered                                                                                                Post partum procedures:postpartum tubal ligation  Augmentation: none  Complications: None  Hospital course:  Sceduled C/S   33 y.o. yo G3P3003 at [redacted]w[redacted]d was admitted to the hospital 09/29/2018 for scheduled cesarean section with the following indication:Elective Repeat.  Membrane Rupture Time/Date: 10:14 AM ,09/29/2018   Patient delivered a Viable infant.09/29/2018  Details of operation can be found in separate operative note.  Pateint had an uncomplicated postpartum course.  She is ambulating, tolerating a regular diet, passing flatus, and urinating well. Patient is discharged home in stable condition on  10/01/18         Physical exam  Vitals:   09/30/18 0539 09/30/18 1000 09/30/18 2222 10/01/18 0515  BP: 116/63 117/74 (!) 97/58 113/76  Pulse: 74  79 92  Resp: 18 20 18 18   Temp: 97.6 F (36.4 C) 98.1 F (36.7 C) 97.7 F (36.5 C) (!) 97.5 F (36.4 C)  TempSrc: Oral  Oral Oral  SpO2: 96% 99%  99%  Weight:      Height:       General: alert, cooperative and no distress Lochia: appropriate Uterine Fundus: firm Incision: Healing well with no significant drainage DVT Evaluation: No evidence of DVT seen on physical exam. No significant calf/ankle edema. Labs: Lab Results  Component Value Date   WBC 8.3 09/30/2018   HGB 11.3 (L) 09/30/2018   HCT 34.1 (L) 09/30/2018   MCV 84.6 09/30/2018    PLT 156 09/30/2018   CMP Latest Ref Rng & Units 02/01/2017  Glucose 70 - 99 mg/dL 95  BUN 6 - 23 mg/dL 18  Creatinine 0.40 - 1.20 mg/dL 0.87  Sodium 135 - 145 mEq/L 139  Potassium 3.5 - 5.1 mEq/L 4.0  Chloride 96 - 112 mEq/L 105  CO2 19 - 32 mEq/L 28  Calcium 8.4 - 10.5 mg/dL 9.1  Total Protein 6.0 - 8.3 g/dL 6.9  Total Bilirubin 0.2 - 1.2 mg/dL 0.3  Alkaline Phos 39 - 117 U/L 32(L)  AST 0 - 37 U/L 12  ALT 0 - 35 U/L 18    Discharge instruction: per After Visit Summary and "Baby and Me Booklet".  After visit meds: Pt has not taken any narcotics post-op and does not want prescription for home. Will use tylenol and ibuprofen at home as she has here and continue PNV.  Allergies as of 10/01/2018   No Known Allergies     Medication List    STOP taking these medications   albuterol 108 (  90 Base) MCG/ACT inhaler Commonly known as:  PROVENTIL HFA;VENTOLIN HFA   valACYclovir 500 MG tablet Commonly known as:  VALTREX     TAKE these medications   prenatal multivitamin Tabs tablet Take 1 tablet by mouth daily.       Diet: routine diet  Activity: Advance as tolerated. Pelvic rest for 6 weeks.   Outpatient follow up:1 week for incision check and circumcision. Follow up Appt:No future appointments. Follow up Visit:No follow-ups on file.  Postpartum contraception: Tubal Ligation  Newborn Data: Live born female  Birth Weight: 8 lb 6.6 oz (3815 g) APGAR: 8, 9  Newborn Delivery   Birth date/time:  09/29/2018 10:14:00 Delivery type:  C-Section, Low Transverse Trial of labor:  No C-section categorization:  Repeat     Baby Feeding: Bottle Disposition:home with mother   10/01/2018 Altha Harm, CNM

## 2018-12-29 ENCOUNTER — Encounter: Payer: Self-pay | Admitting: Internal Medicine

## 2018-12-29 ENCOUNTER — Ambulatory Visit (INDEPENDENT_AMBULATORY_CARE_PROVIDER_SITE_OTHER): Payer: 59 | Admitting: Internal Medicine

## 2018-12-29 DIAGNOSIS — J029 Acute pharyngitis, unspecified: Secondary | ICD-10-CM | POA: Diagnosis not present

## 2018-12-29 MED ORDER — AMOXICILLIN-POT CLAVULANATE 875-125 MG PO TABS: 1.0000 | ORAL_TABLET | Freq: Two times a day (BID) | ORAL | 0 refills | Status: DC

## 2018-12-29 NOTE — Progress Notes (Signed)
Virtual Visit via Video Note  I connected with Brenda [name] on 12/29/18 at  3:30 PM EDT by a video enabled telemedicine application and verified that I am speaking with the correct person using two identifiers.   I discussed the limitations of evaluation and management by telemedicine and the availability of in person appointments. The patient expressed understanding and agreed to proceed.  The patient is currently at home and I am in the office.    No referring provider.    History of Present Illness: This is an acute visit for a sore throat.  Her son had a rash and her daughter had a blister on her arm and she had a blister to.  The kids were taken to the doctor today and they have strep throat.  Her throat has been hurting for a couple of days.  She also has a headache.  She has had some night sweats.  She is concerned she has strep.    Review of Systems  Constitutional: Positive for diaphoresis. Negative for fever.  HENT: Positive for sore throat. Negative for congestion, ear pain and sinus pain.   Respiratory: Negative for cough, shortness of breath and wheezing.   Neurological: Positive for headaches.       Social History   Socioeconomic History  . Marital status: Single    Spouse name: Not on file  . Number of children: Not on file  . Years of education: Not on file  . Highest education level: Not on file  Occupational History  . Not on file  Social Needs  . Financial resource strain: Not hard at all  . Food insecurity:    Worry: Never true    Inability: Never true  . Transportation needs:    Medical: No    Non-medical: Not on file  Tobacco Use  . Smoking status: Never Smoker  . Smokeless tobacco: Never Used  Substance and Sexual Activity  . Alcohol use: Not Currently    Comment: occasional  . Drug use: No  . Sexual activity: Yes    Birth control/protection: None    Comment: sept 2017 - removed   Lifestyle  . Physical activity:    Days per week:  Not on file    Minutes per session: Not on file  . Stress: Only a little  Relationships  . Social connections:    Talks on phone: Not on file    Gets together: Not on file    Attends religious service: Not on file    Active member of club or organization: Not on file    Attends meetings of clubs or organizations: Not on file    Relationship status: Not on file  Other Topics Concern  . Not on file  Social History Narrative   Engaged, 47 year old daughter     Observations/Objective: Appears well in NAD   Assessment and Plan:  See Problem List for Assessment and Plan of chronic medical problems.   Follow Up Instructions:    I discussed the assessment and treatment plan with the patient. The patient was provided an opportunity to ask questions and all were answered. The patient agreed with the plan and demonstrated an understanding of the instructions.   The patient was advised to call back or seek an in-person evaluation if the symptoms worsen or if the condition fails to improve as anticipated.    Binnie Rail, MD

## 2018-12-29 NOTE — Assessment & Plan Note (Signed)
Possible strep throat given two of her kids have it Will empirically start Augmentin advil or tylenol prn Increase fluids  Please call if there is no improvement in your symptoms.

## 2019-02-06 ENCOUNTER — Other Ambulatory Visit: Payer: Self-pay | Admitting: Internal Medicine

## 2019-05-01 ENCOUNTER — Ambulatory Visit (INDEPENDENT_AMBULATORY_CARE_PROVIDER_SITE_OTHER): Payer: 59 | Admitting: Family

## 2019-05-01 ENCOUNTER — Encounter: Payer: Self-pay | Admitting: Family

## 2019-05-01 DIAGNOSIS — J019 Acute sinusitis, unspecified: Secondary | ICD-10-CM | POA: Diagnosis not present

## 2019-05-01 MED ORDER — AMOXICILLIN-POT CLAVULANATE 875-125 MG PO TABS: 1.0000 | ORAL_TABLET | Freq: Two times a day (BID) | ORAL | 0 refills | Status: DC

## 2019-05-01 MED ORDER — FLUTICASONE PROPIONATE 50 MCG/ACT NA SUSP: 2.0000 | Freq: Every day | NASAL | 6 refills | Status: DC

## 2019-05-01 NOTE — Progress Notes (Signed)
Brenda [name] is a 33 y.o. female with the following history as recorded in [employer]:  Patient Active Problem List   Diagnosis Date Noted  . Sore throat 12/29/2018  . Status post repeat low transverse cesarean section 09/29/2018  . Maternal obesity affecting pregnancy, antepartum 03/24/2018  . Allergic rhinitis 02/01/2017  . Genital herpes simplex 02/01/2017  . Prediabetes 11/24/2016  . Vitamin D deficiency 11/24/2016  . S/P primary low transverse C-section 02/28/2016  . Obesity with body mass index 30 or greater 08/31/2015  . Seasonal asthma 08/06/2011  . Morbid obesity ([place]) 08/06/2011  . Fibroids 08/06/2011    Current Outpatient Medications  Medication Sig Dispense Refill  . amoxicillin-clavulanate (AUGMENTIN) 875-125 MG tablet Take 1 tablet by mouth 2 (two) times daily. 20 tablet 0  . fluticasone (FLONASE) 50 MCG/ACT nasal spray Place 2 sprays into both nostrils daily. 16 g 6  . Prenatal Vit-Fe Fumarate-FA (PRENATAL MULTIVITAMIN) TABS tablet Take 1 tablet by mouth daily.      No current facility-administered medications for this visit.     Allergies: Patient has no known allergies.  Past Medical History:  Diagnosis Date  . Abnormal Pap smear 2007   colpo  . Asthma    childhood - PRN inhaler  . Fibroid    4 fibroids   . Gestational diabetes    first pregnancy, diet controlled  . H/O chlamydia infection 2010  . H/O varicella   . Mass of neck 03/02/2014  . Obese   . Rubella non-immune status 01/11/2012  . Vaginal Pap smear, abnormal    biopsy - normal     Past Surgical History:  Procedure Laterality Date  . CESAREAN SECTION N/A 02/28/2016   Procedure: CESAREAN SECTION;  Surgeon: [name], MD;  Location: [facility];  Service: Obstetrics;  Laterality: N/A;  . CESAREAN SECTION WITH BILATERAL TUBAL LIGATION Bilateral 09/29/2018   Procedure: REPEAT CESAREAN SECTION WITH BILATERAL TUBAL LIGATION;  Surgeon: [name], MD;  Location: [place];   Service: Obstetrics;  Laterality: Bilateral;    Family History  Problem Relation Age of Onset  . Hypertension Mother        diet controlled no meds  . Hypertension Father   . Alcohol abuse Father   . Heart disease Maternal Aunt   . Mental illness Maternal Aunt        bipolar  . Diabetes Maternal Aunt   . Heart disease Maternal Grandmother   . Hypertension Maternal Grandmother   . Thrombophlebitis Maternal Grandmother   . Diabetes Maternal Grandmother   . Stroke Maternal Grandmother   . Hypertension Paternal Grandmother   . Heart disease Paternal Grandmother   . Asthma Cousin   . Cancer Cousin   . Heart disease Cousin        congenital heart disease  . Learning disabilities Cousin   . Anesthesia problems Neg Hx     Social History   Tobacco Use  . Smoking status: Never Smoker  . Smokeless tobacco: Never Used  Substance Use Topics  . Alcohol use: Not Currently    Comment: occasional    Subjective:    I connected with [name] on 05/01/19 at  3:20 PM EDT by a video enabled telemedicine application and verified that I am speaking with the correct person using two identifiers.   I discussed the limitations of evaluation and management by telemedicine and the availability of in person appointments. The patient expressed understanding and agreed to proceed.  1-2 week  history of sinus pain/ pressure; has been using OTC Claritin with no relief; + sinus pain, pressure; + post-nasal drainage; no shortness of breath or fever; no difficulty breathing; does have seasonal allergies;   LMP- 04/10/2019- tubal ligation      Objective:  There were no vitals filed for this visit.  General: Well developed, well nourished, in no acute distress  Skin : Warm and dry.  Head: Normocephalic and atraumatic  Lungs: Respirations unlabored;  Neurologic: Alert and oriented; speech intact; face symmetrical;   Assessment:  1. Acute sinusitis, recurrence not specified, unspecified  location     Plan:  Suspect allergy component; continue Claritin and add Flonase; Rx for Augmentin 875 mg bid x 10 days; increase fluids, rest and follow-up worse, no better.   No follow-ups on file.  No orders of the defined types were placed in this encounter.   Requested Prescriptions   Signed Prescriptions Disp Refills  . fluticasone (FLONASE) 50 MCG/ACT nasal spray 16 g 6    Sig: Place 2 sprays into both nostrils daily.  Marland Kitchen amoxicillin-clavulanate (AUGMENTIN) 875-125 MG tablet 20 tablet 0    Sig: Take 1 tablet by mouth 2 (two) times daily.

## 2019-06-19 ENCOUNTER — Other Ambulatory Visit: Payer: Self-pay | Admitting: Obstetrics and Gynecology

## 2019-07-11 ENCOUNTER — Other Ambulatory Visit: Payer: Self-pay | Admitting: Internal Medicine

## 2019-07-17 ENCOUNTER — Other Ambulatory Visit: Payer: Self-pay | Admitting: Family

## 2019-09-14 ENCOUNTER — Other Ambulatory Visit: Payer: Self-pay | Admitting: Family

## 2019-11-21 NOTE — Progress Notes (Signed)
Virtual Visit via Video Note  I connected with Brenda [name] on 11/21/19 at  8:30 AM EDT by a video enabled telemedicine application and verified that I am speaking with the correct person using two identifiers.   I discussed the limitations of evaluation and management by telemedicine and the availability of in person appointments. The patient expressed understanding and agreed to proceed.  Present for the visit:  Myself, Dr Billey Gosling, Delton See.  The patient is currently at work and I am in the office.    No referring provider.    History of Present Illness: She is here for an acute visit for cold symptoms.   Her symptoms started   She is experiencing scratchy throat, watery eyes, dry cough, drainage.  The other day she had some chest tightness like her asthma is acting up.  She thinks she may need an inhaler.  She also wondered about allergy eyedrops.  She has tried taking loratadine.    She is frustrated by her weight.  She had lost some weight, but regained it over the pandemic.  Some of that may be related to some depression.  She is not taking her antidepressant on a daily basis that was prescribed by gynecology.  She does feel better since being at work 3 days a week.  She really wants to work on getting weight off because she is starting to feel more uncomfortable   Review of Systems  Constitutional: Negative for chills and fever.  HENT: Positive for congestion and sore throat. Negative for ear pain and sinus pain.   Eyes: Positive for discharge.  Respiratory: Positive for cough (dry). Negative for shortness of breath and wheezing.        Tightness  Neurological: Negative for dizziness and headaches.      Social History   Socioeconomic History  . Marital status: Single    Spouse name: Not on file  . Number of children: Not on file  . Years of education: Not on file  . Highest education level: Not on file  Occupational History  . Not on file  Tobacco Use   . Smoking status: Never Smoker  . Smokeless tobacco: Never Used  Substance and Sexual Activity  . Alcohol use: Not Currently    Comment: occasional  . Drug use: No  . Sexual activity: Yes    Birth control/protection: None    Comment: sept 2017 - removed   Other Topics Concern  . Not on file  Social History Narrative   Engaged, 29 year old daughter   Social Determinants of Health   Financial Resource Strain:   . Difficulty of Paying Living Expenses:   Food Insecurity:   . Worried About Charity fundraiser in the Last Year:   . Arboriculturist in the Last Year:   Transportation Needs:   . Film/video editor (Medical):   Marland Kitchen Lack of Transportation (Non-Medical):   Physical Activity:   . Days of Exercise per Week:   . Minutes of Exercise per Session:   Stress:   . Feeling of Stress :   Social Connections:   . Frequency of Communication with Friends and Family:   . Frequency of Social Gatherings with Friends and Family:   . Attends Religious Services:   . Active Member of Clubs or Organizations:   . Attends Archivist Meetings:   Marland Kitchen Marital Status:      Observations/Objective: Appears well in NAD Breathing normally, speaking in  full sentences Skin appears warm and dry  Assessment and Plan:  See Problem List for Assessment and Plan of chronic medical problems.   Follow Up Instructions:    I discussed the assessment and treatment plan with the patient. The patient was provided an opportunity to ask questions and all were answered. The patient agreed with the plan and demonstrated an understanding of the instructions.   The patient was advised to call back or seek an in-person evaluation if the symptoms worsen or if the condition fails to improve as anticipated.    Binnie Rail, MD

## 2019-11-22 ENCOUNTER — Ambulatory Visit (INDEPENDENT_AMBULATORY_CARE_PROVIDER_SITE_OTHER): Payer: 59 | Admitting: Internal Medicine

## 2019-11-22 ENCOUNTER — Encounter: Payer: Self-pay | Admitting: Internal Medicine

## 2019-11-22 DIAGNOSIS — J45998 Other asthma: Secondary | ICD-10-CM | POA: Diagnosis not present

## 2019-11-22 DIAGNOSIS — J301 Allergic rhinitis due to pollen: Secondary | ICD-10-CM

## 2019-11-22 MED ORDER — ALBUTEROL SULFATE HFA 108 (90 BASE) MCG/ACT IN AERS: 1.0000 | INHALATION_SPRAY | Freq: Four times a day (QID) | RESPIRATORY_TRACT | 5 refills | Status: DC | PRN

## 2019-11-22 MED ORDER — MONTELUKAST SODIUM 10 MG PO TABS: 10.0000 mg | ORAL_TABLET | Freq: Every day | ORAL | 1 refills | Status: DC

## 2019-11-22 MED ORDER — OLOPATADINE HCL 0.2 % OP SOLN: 1.0000 [drp] | Freq: Two times a day (BID) | OPHTHALMIC | 5 refills | Status: DC

## 2019-11-22 MED ORDER — FLUTICASONE PROPIONATE 50 MCG/ACT NA SUSP: 2.0000 | Freq: Every day | NASAL | 6 refills | Status: DC

## 2019-11-22 NOTE — Assessment & Plan Note (Signed)
Chronic She is frustrated by her weight She has had some depression that has caused some of the weight gain.  Depression is getting better she is back to work a few days a week and has been able to get out more with the kids, which helps Would like to avoid an antidepressant although she was prescribed 1 by her gynecologist she can take Discussed importance of exercise Stressed the diet is the most important thing to concentrate on-start to eliminate sweets and soda-replace with sparkling water or flavored water Discussed healthy weight management clinic-she would like to be referred, which I did today

## 2019-11-22 NOTE — Assessment & Plan Note (Signed)
Acute on chronic Has seasonal asthma related to her allergies We will work on controlling her seasonal allergies better-medication sent to pharmacy Albuterol inhaler as needed Start Singulair Call if symptoms do not improve

## 2019-11-22 NOTE — Assessment & Plan Note (Signed)
Chronic Symptoms consistent with her seasonal allergies Continue loratadine, start Flonase Will try Singulair, albuterol inhaler as needed and antihistamine eyedrops She will see what combination of the above works best for her use as needed She will call if there is no improvement or any questions

## 2020-01-02 ENCOUNTER — Ambulatory Visit (INDEPENDENT_AMBULATORY_CARE_PROVIDER_SITE_OTHER): Payer: Self-pay | Admitting: Family Medicine

## 2020-01-16 ENCOUNTER — Ambulatory Visit (INDEPENDENT_AMBULATORY_CARE_PROVIDER_SITE_OTHER): Payer: Self-pay | Admitting: Family Medicine

## 2020-02-23 ENCOUNTER — Telehealth (INDEPENDENT_AMBULATORY_CARE_PROVIDER_SITE_OTHER): Payer: No Typology Code available for payment source | Admitting: Family

## 2020-02-23 DIAGNOSIS — K219 Gastro-esophageal reflux disease without esophagitis: Secondary | ICD-10-CM

## 2020-02-23 MED ORDER — PANTOPRAZOLE SODIUM 40 MG PO TBEC: 40.0000 mg | DELAYED_RELEASE_TABLET | Freq: Every day | ORAL | 0 refills | Status: DC

## 2020-02-23 NOTE — Progress Notes (Signed)
Brenda [name] is a 34 y.o. female with the following history as recorded in Epic[employer]:  Patient Active Problem List   Diagnosis Date Noted  . Status post repeat low transverse cesarean section 09/29/2018  . Maternal obesity affecting pregnancy, antepartum 03/24/2018  . Allergic rhinitis 02/01/2017  . Genital herpes simplex 02/01/2017  . Prediabetes 11/24/2016  . Vitamin D deficiency 11/24/2016  . S/P primary low transverse C-section 02/28/2016  . Obesity with body mass index 30 or greater 08/31/2015  . Seasonal asthma 08/06/2011  . Morbid obesity ([place]) 08/06/2011  . Fibroids 08/06/2011    Current Outpatient Medications  Medication Sig Dispense Refill  . albuterol (VENTOLIN HFA) 108 (90 Base) MCG/ACT inhaler Inhale 1-2 puffs into the lungs every 6 (six) hours as needed for wheezing or shortness of breath. 6.7 g 5  . fluticasone (FLONASE) 50 MCG/ACT nasal spray Place 2 sprays into both nostrils daily. 16 g 6  . loratadine (CLARITIN) 10 MG tablet Claritin    . montelukast (SINGULAIR) 10 MG tablet Take 1 tablet (10 mg total) by mouth at bedtime. 90 tablet 1  . Olopatadine HCl 0.2 % SOLN Apply 1 drop to eye in the morning and at bedtime. 2.5 mL 5  . pantoprazole (PROTONIX) 40 MG tablet Take 1 tablet (40 mg total) by mouth daily. 90 tablet 0  . Prenatal Vit-Fe Fumarate-FA (PRENATAL MULTIVITAMIN) TABS tablet Take 1 tablet by mouth daily.      No current facility-administered medications for this visit.    Allergies: Sertraline  Past Medical History:  Diagnosis Date  . Abnormal Pap smear 2007   colpo  . Asthma    childhood - PRN inhaler  . Fibroid    4 fibroids   . Gestational diabetes    first pregnancy, diet controlled  . H/O chlamydia infection 2010  . H/O varicella   . Mass of neck 03/02/2014  . Obese   . Rubella non-immune status 01/11/2012  . Vaginal Pap smear, abnormal    biopsy - normal     Past Surgical History:  Procedure Laterality Date  . CESAREAN SECTION N/A  02/28/2016   Procedure: CESAREAN SECTION;  Surgeon: [name], MD;  Location: [facility];  Service: Obstetrics;  Laterality: N/A;  . CESAREAN SECTION WITH BILATERAL TUBAL LIGATION Bilateral 09/29/2018   Procedure: REPEAT CESAREAN SECTION WITH BILATERAL TUBAL LIGATION;  Surgeon: [name], MD;  Location: [facility];  Service: Obstetrics;  Laterality: Bilateral;    Family History  Problem Relation Age of Onset  . Hypertension Mother        diet controlled no meds  . Hypertension Father   . Alcohol abuse Father   . Heart disease Maternal Aunt   . Mental illness Maternal Aunt        bipolar  . Diabetes Maternal Aunt   . Heart disease Maternal Grandmother   . Hypertension Maternal Grandmother   . Thrombophlebitis Maternal Grandmother   . Diabetes Maternal Grandmother   . Stroke Maternal Grandmother   . Hypertension Paternal Grandmother   . Heart disease Paternal Grandmother   . Asthma Cousin   . Cancer Cousin   . Heart disease Cousin        congenital heart disease  . Learning disabilities Cousin   . Anesthesia problems Neg Hx     Social History   Tobacco Use  . Smoking status: Never Smoker  . Smokeless tobacco: Never Used  Substance Use Topics  . Alcohol use: Not Currently  Comment: occasional    Subjective:   I connected with [name] on 02/23/20 at  8:40 AM EDT by a video enabled telemedicine application and verified that I am speaking with the correct person using two identifiers.   I discussed the limitations of evaluation and management by telemedicine and the availability of in person appointments. The patient expressed understanding and agreed to proceed. Provider in office/ patient is at home; provider and patient are only 2 people on video call.   Complaining of "burning with eating" x 1 month; does feel like she is burping more recently; no sense of food getting stuck; no fever, night sweats and no changes in bowel movements; no  pain or nausea specifically after eating; no prior history of acid reflux- has not tried any OTC medications;  LMP June 3/ Tubal ligation    Objective:  There were no vitals filed for this visit.  General: Well developed, well nourished, in no acute distress  Head: Normocephalic and atraumatic  Lungs: Respirations unlabored; Neurologic: Alert and oriented; speech intact;   Assessment:  1. Gastroesophageal reflux disease, unspecified whether esophagitis present     Plan:  Discussed limitations for diagnosing/ treating these symptoms over the computer; encouraged in office evaluation; in the interim, will have patient try Protonix 40 mg daily;   No follow-ups on file.  No orders of the defined types were placed in this encounter.   Requested Prescriptions   Signed Prescriptions Disp Refills  . pantoprazole (PROTONIX) 40 MG tablet 90 tablet 0    Sig: Take 1 tablet (40 mg total) by mouth daily.

## 2020-03-09 ENCOUNTER — Ambulatory Visit (HOSPITAL_COMMUNITY)
Admission: EM | Admit: 2020-03-09 | Discharge: 2020-03-09 | Disposition: A | Discharge status: Home / Self Care | Payer: No Typology Code available for payment source | Attending: Family Medicine | Admitting: Family Medicine

## 2020-03-09 ENCOUNTER — Other Ambulatory Visit: Payer: Self-pay

## 2020-03-09 ENCOUNTER — Encounter (HOSPITAL_COMMUNITY): Payer: Self-pay

## 2020-03-09 DIAGNOSIS — Z20822 Contact with and (suspected) exposure to covid-19: Secondary | ICD-10-CM | POA: Diagnosis present

## 2020-03-09 LAB — SARS CORONAVIRUS 2 (TAT 6-24 HRS): SARS Coronavirus 2: NEGATIVE

## 2020-03-09 NOTE — Discharge Instructions (Addendum)
You have been tested for COVID-19 today. °If your test returns positive, you will receive a phone call from [facility] regarding your results. °Negative test results are not called. °Both positive and negative results area always visible on MyChart. °If you do not have a MyChart account, sign up instructions are provided in your discharge papers. °Please do not hesitate to contact us should you have questions or concerns. ° °

## 2020-03-09 NOTE — ED Triage Notes (Signed)
Pt's children are in daycare and they were exposed to [place] + children at daycare. Pt c/o sore throat on Wednesday and runny nose.

## 2020-03-09 NOTE — ED Provider Notes (Addendum)
[facility]   [id number] 03/09/20 Arrival Time: 7341  ASSESSMENT & PLAN:  1. Exposure to COVID-19 virus      COVID-19 testing sent. See letter/work note on file for self-isolation guidelines. OTC symptom care as needed.   Follow-up Information    Burns, Brenda Lick, MD.   Specialty: Internal Medicine Why: As needed. Contact information: [place] [address] [phone]               Reviewed expectations re: course of current medical issues. Questions answered. Outlined signs and symptoms indicating need for more acute intervention. Understanding verbalized. After Visit Summary given.   SUBJECTIVE: History from: patient. Brenda [name] is a 34 y.o. female who requests COVID-19 testing. Known COVID-19 contact: children possibly exposed and with mild cold symptoms. Recent travel: none. She denies: runny nose, congestion, fever, cough, sore throat, difficulty breathing and headache. Normal PO intake without n/v/d.    OBJECTIVE:  Vitals:   03/09/20 1125 03/09/20 1131  BP:  (!) 130/83  Pulse:  79  Resp:  18  Temp:  97.9 F (36.6 C)  TempSrc:  Oral  SpO2:  99%  Weight: (!) 108.9 kg   Height: 5' 3.5" (1.613 m)     General appearance: alert; no distress Eyes: PERRLA; EOMI; conjunctiva normal HENT: [place]; AT; nasal mucosa normal; oral mucosa normal Neck: supple  Lungs: speaks full sentences without difficulty; unlabored Extremities: no edema Skin: warm and dry Neurologic: normal gait Psychological: alert and cooperative; normal mood and affect  Labs:  Labs Reviewed  SARS CORONAVIRUS 2 (TAT 6-24 HRS)    Allergies  Allergen Reactions  . Sertraline Other (See Comments)    Made her cry all the time    Past Medical History:  Diagnosis Date  . Abnormal Pap smear 2007   colpo  . Asthma    childhood - PRN inhaler  . Fibroid    4 fibroids   . Gestational diabetes    first pregnancy, diet controlled  . H/O chlamydia  infection 2010  . H/O varicella   . Mass of neck 03/02/2014  . Obese   . Rubella non-immune status 01/11/2012  . Vaginal Pap smear, abnormal    biopsy - normal    Social History   Socioeconomic History  . Marital status: Single    Spouse name: Not on file  . Number of children: Not on file  . Years of education: Not on file  . Highest education level: Not on file  Occupational History  . Not on file  Tobacco Use  . Smoking status: Never Smoker  . Smokeless tobacco: Never Used  Vaping Use  . Vaping Use: Never used  Substance and Sexual Activity  . Alcohol use: Yes    Comment: occasional  . Drug use: No  . Sexual activity: Yes    Birth control/protection: None    Comment: sept 2017 - removed   Other Topics Concern  . Not on file  Social History Narrative   Engaged, 62 year old daughter   Social Determinants of Health   Financial Resource Strain:   . Difficulty of Paying Living Expenses:   Food Insecurity:   . Worried About Charity fundraiser in the Last Year:   . Arboriculturist in the Last Year:   Transportation Needs:   . Film/video editor (Medical):   Brenda [name] Lack of Transportation (Non-Medical):   Physical Activity:   . Days of Exercise per Week:   .  Minutes of Exercise per Session:   Stress:   . Feeling of Stress :   Social Connections:   . Frequency of Communication with Friends and Family:   . Frequency of Social Gatherings with Friends and Family:   . Attends Religious Services:   . Active Member of Clubs or Organizations:   . Attends Archivist Meetings:   Brenda [name] Marital Status:   Intimate Partner Violence:   . Fear of Current or Ex-Partner:   . Emotionally Abused:   Brenda [name] Physically Abused:   . Sexually Abused:    Family History  Problem Relation Age of Onset  . Hypertension Mother        diet controlled no meds  . Hypertension Father   . Alcohol abuse Father   . Heart disease Maternal Aunt   . Mental illness Maternal Aunt        bipolar  .  Diabetes Maternal Aunt   . Heart disease Maternal Grandmother   . Hypertension Maternal Grandmother   . Thrombophlebitis Maternal Grandmother   . Diabetes Maternal Grandmother   . Stroke Maternal Grandmother   . Hypertension Paternal Grandmother   . Heart disease Paternal Grandmother   . Asthma Cousin   . Cancer Cousin   . Heart disease Cousin        congenital heart disease  . Learning disabilities Cousin   . Anesthesia problems Neg Hx    Past Surgical History:  Procedure Laterality Date  . CESAREAN SECTION N/A 02/28/2016   Procedure: CESAREAN SECTION;  Surgeon: [name], MD;  Location: [facility];  Service: Obstetrics;  Laterality: N/A;  . CESAREAN SECTION WITH BILATERAL TUBAL LIGATION Bilateral 09/29/2018   Procedure: REPEAT CESAREAN SECTION WITH BILATERAL TUBAL LIGATION;  Surgeon: [name], MD;  Location: [facility];  Service: Obstetrics;  Laterality: Bilateral;     Vanessa Kick, MD 03/09/20 1150    Vanessa Kick, MD 03/09/20 1150

## 2020-03-13 ENCOUNTER — Telehealth: Payer: No Typology Code available for payment source

## 2020-05-02 ENCOUNTER — Encounter (HOSPITAL_COMMUNITY): Payer: Self-pay | Admitting: Emergency Medicine

## 2020-05-02 ENCOUNTER — Other Ambulatory Visit: Payer: Self-pay

## 2020-05-02 ENCOUNTER — Ambulatory Visit (HOSPITAL_COMMUNITY)
Admission: EM | Admit: 2020-05-02 | Discharge: 2020-05-02 | Disposition: A | Discharge status: Home / Self Care | Payer: No Typology Code available for payment source | Attending: Internal Medicine | Admitting: Internal Medicine

## 2020-05-02 DIAGNOSIS — Z833 Family history of diabetes mellitus: Secondary | ICD-10-CM | POA: Insufficient documentation

## 2020-05-02 DIAGNOSIS — Z20822 Contact with and (suspected) exposure to covid-19: Secondary | ICD-10-CM | POA: Insufficient documentation

## 2020-05-02 DIAGNOSIS — J029 Acute pharyngitis, unspecified: Secondary | ICD-10-CM

## 2020-05-02 DIAGNOSIS — R7303 Prediabetes: Secondary | ICD-10-CM | POA: Insufficient documentation

## 2020-05-02 DIAGNOSIS — Z79899 Other long term (current) drug therapy: Secondary | ICD-10-CM | POA: Diagnosis not present

## 2020-05-02 DIAGNOSIS — J069 Acute upper respiratory infection, unspecified: Secondary | ICD-10-CM | POA: Diagnosis not present

## 2020-05-02 LAB — POCT RAPID STREP A, ED / UC: Streptococcus, Group A Screen (Direct): NEGATIVE

## 2020-05-02 LAB — SARS CORONAVIRUS 2 (TAT 6-24 HRS): SARS Coronavirus 2: NEGATIVE

## 2020-05-02 NOTE — ED Triage Notes (Signed)
Pt c/o sore throat onset Monday. She also has some back aches and chest pain and congestion. She denies fever.

## 2020-05-02 NOTE — ED Provider Notes (Signed)
[facility]    CSN: [id number] Arrival date & time: 05/02/20  1110      History   Chief Complaint Chief Complaint  Patient presents with  . Sore Throat  . Generalized Body Aches    HPI Brenda [name] is a 34 y.o. female.   Here today for sore throat, chest tightness, congestion x 3 days. Denies fever, chills, CP, SOB, wheezing, N/V/D, rashes. Daughter also sick with similar sxs. No known sick contacts. Not trying anything OTC for sxs at this time.      Past Medical History:  Diagnosis Date  . Abnormal Pap smear 2007   colpo  . Asthma    childhood - PRN inhaler  . Fibroid    4 fibroids   . Gestational diabetes    first pregnancy, diet controlled  . H/O chlamydia infection 2010  . H/O varicella   . Mass of neck 03/02/2014  . Obese   . Rubella non-immune status 01/11/2012  . Vaginal Pap smear, abnormal    biopsy - normal     Patient Active Problem List   Diagnosis Date Noted  . Status post repeat low transverse cesarean section 09/29/2018  . Maternal obesity affecting pregnancy, antepartum 03/24/2018  . Allergic rhinitis 02/01/2017  . Genital herpes simplex 02/01/2017  . Prediabetes 11/24/2016  . Vitamin D deficiency 11/24/2016  . S/P primary low transverse C-section 02/28/2016  . Obesity with body mass index 30 or greater 08/31/2015  . Seasonal asthma 08/06/2011  . Morbid obesity ([facility]) 08/06/2011  . Fibroids 08/06/2011    Past Surgical History:  Procedure Laterality Date  . CESAREAN SECTION N/A 02/28/2016   Procedure: CESAREAN SECTION;  Surgeon: [name], MD;  Location: [facility];  Service: Obstetrics;  Laterality: N/A;  . CESAREAN SECTION WITH BILATERAL TUBAL LIGATION Bilateral 09/29/2018   Procedure: REPEAT CESAREAN SECTION WITH BILATERAL TUBAL LIGATION;  Surgeon: [name], MD;  Location: [facility];  Service: Obstetrics;  Laterality: Bilateral;    OB History    Gravida  3   Para  3   Term  3   Preterm  0    AB  0   Living  3     SAB  0   TAB  0   Ectopic  0   Multiple  0   Live Births  3            Home Medications    Prior to Admission medications   Medication Sig Start Date End Date Taking? Authorizing Provider  albuterol (VENTOLIN HFA) 108 (90 Base) MCG/ACT inhaler Inhale 1-2 puffs into the lungs every 6 (six) hours as needed for wheezing or shortness of breath. 11/22/19  Yes Burns, [name], MD  fluticasone (FLONASE) 50 MCG/ACT nasal spray Place 2 sprays into both nostrils daily. 11/22/19  Yes Burns, [name], MD  loratadine (CLARITIN) 10 MG tablet Claritin   Yes [provider]  pantoprazole (PROTONIX) 40 MG tablet Take 1 tablet (40 mg total) by mouth daily. 02/23/20  Yes Marrian Salvage, FNP  montelukast (SINGULAIR) 10 MG tablet Take 1 tablet (10 mg total) by mouth at bedtime. 11/22/19   Binnie Rail, MD  Olopatadine HCl 0.2 % SOLN Apply 1 drop to eye in the morning and at bedtime. 11/22/19   Binnie Rail, MD  Prenatal Vit-Fe Fumarate-FA (PRENATAL MULTIVITAMIN) TABS tablet Take 1 tablet by mouth daily.     [provider]    Family History Family  History  Problem Relation Age of Onset  . Hypertension Mother        diet controlled no meds  . Hypertension Father   . Alcohol abuse Father   . Heart disease Maternal Aunt   . Mental illness Maternal Aunt        bipolar  . Diabetes Maternal Aunt   . Heart disease Maternal Grandmother   . Hypertension Maternal Grandmother   . Thrombophlebitis Maternal Grandmother   . Diabetes Maternal Grandmother   . Stroke Maternal Grandmother   . Hypertension Paternal Grandmother   . Heart disease Paternal Grandmother   . Asthma Cousin   . Cancer Cousin   . Heart disease Cousin        congenital heart disease  . Learning disabilities Cousin   . Anesthesia problems Neg Hx     Social History Social History   Tobacco Use  . Smoking status: Never Smoker  . Smokeless tobacco: Never Used  Vaping Use    . Vaping Use: Never used  Substance Use Topics  . Alcohol use: Yes    Comment: occasional  . Drug use: No     Allergies   Sertraline   Review of Systems Review of Systems PER HPI    Physical Exam Triage Vital Signs ED Triage Vitals  Enc Vitals Group     BP 05/02/20 1209 128/83     Pulse Rate 05/02/20 1209 84     Resp 05/02/20 1209 17     Temp 05/02/20 1209 98.6 F (37 C)     Temp Source 05/02/20 1209 Oral     SpO2 05/02/20 1209 100 %     Weight --      Height --      Head Circumference --      Peak Flow --      Pain Score 05/02/20 1205 4     Pain Loc --      Pain Edu? --      Excl. in [place]? --    No data found.  Updated Vital Signs BP 128/83 (BP Location: Left Arm)   Pulse 84   Temp 98.6 F (37 C) (Oral)   Resp 17   LMP 03/18/2020   SpO2 100%   Visual Acuity Right Eye Distance:   Left Eye Distance:   Bilateral Distance:    Right Eye Near:   Left Eye Near:    Bilateral Near:     Physical Exam Vitals and nursing note reviewed.  Constitutional:      Appearance: Normal appearance. She is not ill-appearing.  HENT:     Head: Atraumatic.     Right Ear: Tympanic membrane normal.     Left Ear: Tympanic membrane normal.     Nose: Nose normal.     Mouth/Throat:     Mouth: Mucous membranes are moist.     Pharynx: Posterior oropharyngeal erythema present.  Eyes:     Extraocular Movements: Extraocular movements intact.     Conjunctiva/sclera: Conjunctivae normal.  Cardiovascular:     Rate and Rhythm: Normal rate and regular rhythm.     Heart sounds: Normal heart sounds.  Pulmonary:     Effort: Pulmonary effort is normal.     Breath sounds: Normal breath sounds. No wheezing or rales.  Abdominal:     General: Bowel sounds are normal. There is no distension.     Palpations: Abdomen is soft.     Tenderness: There is no abdominal tenderness. There is no guarding.  Musculoskeletal:        General: Normal range of motion.     Cervical back: Normal range  of motion and neck supple.  Skin:    General: Skin is warm and dry.  Neurological:     Mental Status: She is alert and oriented to person, place, and time.  Psychiatric:        Mood and Affect: Mood normal.        Thought Content: Thought content normal.        Judgment: Judgment normal.      UC Treatments / Results  Labs (all labs ordered are listed, but only abnormal results are displayed) Labs Reviewed  SARS CORONAVIRUS 2 (TAT 6-24 HRS)  CULTURE, GROUP A STREP [facility])  POCT RAPID STREP A, ED / UC    EKG   Radiology No results found.  Procedures Procedures (including critical care time)  Medications Ordered in UC Medications - No data to display  Initial Impression / Assessment and Plan / UC Course  I have reviewed the triage vital signs and the nursing notes.  Pertinent labs & imaging results that were available during my care of the patient were reviewed by me and considered in my medical decision making (see chart for details).     Suspect viral URI, COVID pcr pending, rapid strep neg. Discussed home care with OTC medications. Isolation protocol reviewed while awaiting results, work note given. REturn precautions reviewed at length.    Final Clinical Impressions(s) / UC Diagnoses   Final diagnoses:  Viral URI   Discharge Instructions   None    ED Prescriptions    None     PDMP not reviewed this encounter.   Volney American, [place] 05/02/20 2110

## 2020-05-04 ENCOUNTER — Encounter: Payer: Self-pay | Admitting: Family

## 2020-05-04 LAB — CULTURE, GROUP A STREP (THRC)

## 2020-05-18 ENCOUNTER — Other Ambulatory Visit: Payer: Self-pay | Admitting: Family

## 2020-05-19 ENCOUNTER — Other Ambulatory Visit: Payer: Self-pay | Admitting: Internal Medicine

## 2020-07-26 ENCOUNTER — Telehealth: Payer: Self-pay | Admitting: Internal Medicine

## 2020-07-26 NOTE — Telephone Encounter (Signed)
Patient calling back stating [facility] was very rude and told her she needed to go to the emergency room. Patient refused and wanted to speak with someone else. Spoke with Garnette Czech and she instructed me to inform the patient to either reach out to her OBGYN if she has one or go to urgent care. Patient agreed with plan and is contacting her OBGYN.

## 2020-07-26 NOTE — Telephone Encounter (Signed)
Patient calling stating she has had her menstrual cycle since October and she is now lightheaded, dizzy, and having some heart palpitations. Transferred to [facility] for further evaluation.

## 2020-08-16 ENCOUNTER — Other Ambulatory Visit: Payer: Self-pay | Admitting: Obstetrics and Gynecology

## 2020-08-28 ENCOUNTER — Ambulatory Visit (HOSPITAL_COMMUNITY): Payer: Self-pay

## 2020-09-17 NOTE — Progress Notes (Unsigned)
Subjective:    Patient ID: Brenda [name], female    DOB: 12/26/1985, 35 y.o.   MRN: [id number]  HPI The patient is here for an acute visit.   Back pain - her back pain started Sunday night, 3 nights ago.  This is the third time it has happened, but not this severe.  It hurts at the area of her epidural.  It is stiff.  The pain wraps around to her waist/groin bilaterally and down her legs into her upper thighs.  Any movmenets hurts.  Her buttock feel weak and hurt at times.  Both legs felt numb yesterday. She feels that mostly when standing striaght up.  She still has some minimally decreased sensation in her upper thighs.  Her legs do feel weak.  She has not lifted anything heavy, but does have a 68-year-old and she is lifting the baby up.  She works from home.  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  She has gained weight and knows her weight may be contributing.  She took ibuprofen yesterday - helped a little. Heating pad helped.    The last episode occurred when she had teeth pulled and was laying down for the procedure.  It lasted a few days and went away on its own.  The episode prior to that went away on its own within a few days.  Neither were this severe.   Medications and allergies reviewed with patient and updated if appropriate.  Patient Active Problem List   Diagnosis Date Noted  . Status post repeat low transverse cesarean section 09/29/2018  . Maternal obesity affecting pregnancy, antepartum 03/24/2018  . Allergic rhinitis 02/01/2017  . Genital herpes simplex 02/01/2017  . Prediabetes 11/24/2016  . Vitamin D deficiency 11/24/2016  . S/P primary low transverse C-section 02/28/2016  . Obesity with body mass index 30 or greater 08/31/2015  . Seasonal asthma 08/06/2011  . Morbid obesity ([place]) 08/06/2011  . Fibroids 08/06/2011    Current Outpatient Medications on File Prior to Visit  Medication Sig Dispense Refill  . albuterol (VENTOLIN HFA) 108 (90 Base) MCG/ACT  inhaler Inhale 1-2 puffs into the lungs every 6 (six) hours as needed for wheezing or shortness of breath. 6.7 g 5  . fluticasone (FLONASE) 50 MCG/ACT nasal spray Place 2 sprays into both nostrils daily. 16 g 6  . loratadine (CLARITIN) 10 MG tablet Claritin    . montelukast (SINGULAIR) 10 MG tablet TAKE 1 TABLET BY MOUTH EVERYDAY AT BEDTIME 90 tablet 1  . norethindrone-ethinyl estradiol (CYCLAFEM) 0.5/0.75/1-35 MG-MCG tablet Alyacen 1/35 (28) 1 mg-35 mcg tablet  TAKE 1 TABLET BY MOUTH DAILY.    Marland Kitchen Olopatadine HCl 0.2 % SOLN Apply 1 drop to eye in the morning and at bedtime. 2.5 mL 5  . pantoprazole (PROTONIX) 40 MG tablet TAKE 1 TABLET BY MOUTH EVERY DAY 90 tablet 0  . Prenatal Vit-Fe Fumarate-FA (PRENATAL MULTIVITAMIN) TABS tablet Take 1 tablet by mouth daily.     . valACYclovir (VALTREX) 500 MG tablet Take 500 mg by mouth daily.     No current facility-administered medications on file prior to visit.    Past Medical History:  Diagnosis Date  . Abnormal Pap smear 2007   colpo  . Asthma    childhood - PRN inhaler  . Fibroid    4 fibroids   . Gestational diabetes    first pregnancy, diet controlled  . H/O chlamydia infection 2010  . H/O varicella   . Mass  of neck 03/02/2014  . Obese   . Rubella non-immune status 01/11/2012  . Vaginal Pap smear, abnormal    biopsy - normal     Past Surgical History:  Procedure Laterality Date  . CESAREAN SECTION N/A 02/28/2016   Procedure: CESAREAN SECTION;  Surgeon: [name], MD;  Location: [facility];  Service: Obstetrics;  Laterality: N/A;  . CESAREAN SECTION WITH BILATERAL TUBAL LIGATION Bilateral 09/29/2018   Procedure: REPEAT CESAREAN SECTION WITH BILATERAL TUBAL LIGATION;  Surgeon: [name], MD;  Location: [facility];  Service: Obstetrics;  Laterality: Bilateral;    Social History   Socioeconomic History  . Marital status: Single    Spouse name: Not on file  . Number of children: Not on file  . Years of  education: Not on file  . Highest education level: Not on file  Occupational History  . Not on file  Tobacco Use  . Smoking status: Never Smoker  . Smokeless tobacco: Never Used  Vaping Use  . Vaping Use: Never used  Substance and Sexual Activity  . Alcohol use: Yes    Comment: occasional  . Drug use: No  . Sexual activity: Yes    Birth control/protection: None    Comment: sept 2017 - removed   Other Topics Concern  . Not on file  Social History Narrative   Engaged, 53 year old daughter   Social Determinants of Health   Financial Resource Strain: Not on file  Food Insecurity: Not on file  Transportation Needs: Not on file  Physical Activity: Not on file  Stress: Not on file  Social Connections: Not on file    Family History  Problem Relation Age of Onset  . Hypertension Mother        diet controlled no meds  . Hypertension Father   . Alcohol abuse Father   . Heart disease Maternal Aunt   . Mental illness Maternal Aunt        bipolar  . Diabetes Maternal Aunt   . Heart disease Maternal Grandmother   . Hypertension Maternal Grandmother   . Thrombophlebitis Maternal Grandmother   . Diabetes Maternal Grandmother   . Stroke Maternal Grandmother   . Hypertension Paternal Grandmother   . Heart disease Paternal Grandmother   . Asthma Cousin   . Cancer Cousin   . Heart disease Cousin        congenital heart disease  . Learning disabilities Cousin   . Anesthesia problems Neg Hx     Review of Systems  Constitutional: Negative for chills and fever.  Gastrointestinal:       No change in bowel habits  Genitourinary:       No change in urination  Musculoskeletal: Positive for back pain (Lumbar back).  Neurological: Positive for weakness (Bilateral legs) and numbness (Anterior thighs).       Objective:   Vitals:   09/18/20 0913  BP: 136/84  Pulse: 90  Temp: 98.3 F (36.8 C)  SpO2: 99%   BP Readings from Last 3 Encounters:  09/18/20 136/84  05/02/20 128/83   03/09/20 (!) 130/83   Wt Readings from Last 3 Encounters:  09/18/20 251 lb (113.9 kg)  03/09/20 (!) 240 lb (108.9 kg)  09/29/18 266 lb (120.7 kg)   Body mass index is 43.77 kg/m.   Physical Exam Constitutional:      General: She is not in acute distress.    Appearance: Normal appearance. She is not ill-appearing.  HENT:  Head: Normocephalic and atraumatic.  Musculoskeletal:        General: Tenderness (Tenderness with palpation along lumbar spine) present. No swelling or deformity.     Right lower leg: No edema.     Left lower leg: No edema.  Skin:    General: Skin is warm and dry.  Neurological:     Mental Status: She is alert.     Sensory: Sensory deficit (Slightly decreased sensation anterior upper legs bilaterally) present.     Motor: No weakness.     Gait: Gait abnormal (Secondary to pain).     Deep Tendon Reflexes: Reflexes normal.            Assessment & Plan:    See Problem List for Assessment and Plan of chronic medical problems.    This visit occurred during the SARS-CoV-2 public health emergency.  Safety protocols were in place, including screening questions prior to the visit, additional usage of staff PPE, and extensive cleaning of exam room while observing appropriate contact time as indicated for disinfecting solutions.

## 2020-09-18 ENCOUNTER — Other Ambulatory Visit: Payer: Self-pay

## 2020-09-18 ENCOUNTER — Ambulatory Visit (INDEPENDENT_AMBULATORY_CARE_PROVIDER_SITE_OTHER): Payer: No Typology Code available for payment source | Admitting: Internal Medicine

## 2020-09-18 ENCOUNTER — Encounter: Payer: Self-pay | Admitting: Internal Medicine

## 2020-09-18 ENCOUNTER — Ambulatory Visit (INDEPENDENT_AMBULATORY_CARE_PROVIDER_SITE_OTHER): Payer: No Typology Code available for payment source

## 2020-09-18 VITALS — BP 136/84 | HR 90 | Temp 98.3°F | Ht 63.5 in | Wt 251.0 lb

## 2020-09-18 DIAGNOSIS — M5441 Lumbago with sciatica, right side: Secondary | ICD-10-CM | POA: Diagnosis not present

## 2020-09-18 DIAGNOSIS — M5442 Lumbago with sciatica, left side: Secondary | ICD-10-CM | POA: Diagnosis not present

## 2020-09-18 MED ORDER — METHYLPREDNISOLONE 4 MG PO TBPK: ORAL_TABLET | ORAL | 0 refills | Status: DC

## 2020-09-18 MED ORDER — METHOCARBAMOL 500 MG PO TABS: 500.0000 mg | ORAL_TABLET | Freq: Four times a day (QID) | ORAL | 0 refills | Status: DC | PRN

## 2020-09-18 NOTE — Patient Instructions (Addendum)
Have an xray downstairs today.    A referral was ordered for sports medicine.     Take the steroid as prescribed.  Hold ibuprofen while taking this.    Take the muscle relaxer as needed.

## 2020-09-18 NOTE — Assessment & Plan Note (Addendum)
Acute Started 3 nights ago-no obvious cause Lumbar back pain radiating to bilateral buttock regions, bilateral groin and upper thighs Some numbness/decreased sensation in anterior thighs and weakness in legs Has had some muscle spasms Start Medrol Dosepak Methocarbamol 500 mg every 6 hours as needed Advised her to hold the ibuprofen while on the prednisone X-ray of lumbar spine today We will refer to sports medicine Discussed weight loss

## 2020-09-26 NOTE — Progress Notes (Signed)
Subjective:    I'm seeing this patient as a consultation for:  Dr. Quay Burow. Note will be routed back to referring provider/PCP.  CC: Low back pain  I, Molly Weber, LAT, ATC, am serving as scribe for Dr. Lynne Leader.  HPI: Pt is a 35 y/o female presenting w/ c/o acute on chronic low back pain x approximately 1.5 weeks w/ no known MOI.  She locates her pain to middle of her low back.  She was seen by her PCP on 09/18/20 and was prescribed a Medro dosepack and Methocarbamol. Pt reports back feels like it's spasm in her low back. Pt reports radiating pain into LE has resolved.  Originally pain was radiating to the anterior groin and thighs bilaterally.  This resolved with a oral steroids.  Radiating pain: yes into B buttocks/coccyx LE numbness/tingling: no LE weakness: sometimes Aggravating factors: trunk flexion, sitting for long periods Treatments tried: Medrol dosepack, Methocarbamol, heat, IBU  Diagnostic imaging: L-spine XR- 09/18/20  Past medical history, Surgical history, Family history, Social history, Allergies, and medications have been entered into the medical record, reviewed.   Review of Systems: No new headache, visual changes, nausea, vomiting, diarrhea, constipation, dizziness, abdominal pain, skin rash, fevers, chills, night sweats, weight loss, swollen lymph nodes, body aches, joint swelling, muscle aches, chest pain, shortness of breath, mood changes, visual or auditory hallucinations.   Objective:    Vitals:   09/27/20 0958  BP: 128/83  Pulse: 86  SpO2: 93%   General: Well Developed, well nourished, and in no acute distress.  Neuro/Psych: Alert and oriented x3, extra-ocular muscles intact, able to move all 4 extremities, sensation grossly intact. Skin: Warm and dry, no rashes noted.  Respiratory: Not using accessory muscles, speaking in full sentences, trachea midline.  Cardiovascular: Pulses palpable, no extremity edema. Abdomen: Does not appear distended. MSK:  L-spine normal. Nontender midline. Nontender paraspinal musculature. Decreased lumbar motion. Motion and strength reflexes and sensation are equal normal throughout bilateral lower extremities. Hips bilaterally normal.  Nontender normal motion normal strength.  Lab and Radiology Results  DG Lumbar Spine Complete  Result Date: 09/18/2020 CLINICAL DATA:  Low back pain with radicular symptoms EXAM: LUMBAR SPINE - COMPLETE 4+ VIEW COMPARISON:  None. FINDINGS: Frontal, lateral, spot lumbosacral lateral, and bilateral oblique views were obtained. There are 5 non-rib-bearing lumbar type vertebral bodies. There is no fracture or spondylolisthesis. There is mild disc space narrowing at L5-S1. Disc spaces at other levels appear unremarkable. There is no appreciable facet arthropathy. There are calcifications in the pelvis, likely representing uterine leiomyomas IMPRESSION: Mild disc space narrowing at L5-S1. Other disc spaces appear unremarkable. No appreciable facet arthropathy. No fracture or spondylolisthesis. Apparent calcified uterine leiomyomas in the pelvis. Electronically Signed   By: Lowella Grip III M.D.   On: 09/18/2020 14:49   I, Lynne Leader, personally (independently) visualized and performed the interpretation of the images attached in this note.   Impression and Recommendations:    Assessment and Plan: 35 y.o. female with low back pain with history of possible L2 radiculopathy.  Pain now much more lumbar paraspinal muscle spasm and dysfunction related.  Plan for physical therapy.  Also recommend heating pad and TENS unit.  Reasonable to continue methocarbamol intermittently as needed.  If pain should return or worsen can prescribe gabapentin or refill steroid Dosepak.  Recheck in about 6 weeks.  Precautions reviewed.Marland Kitchen  PDMP not reviewed this encounter. Orders Placed This Encounter  Procedures  . Ambulatory referral to Physical Therapy  Referral Priority:   Routine    Referral  Type:   Physical Medicine    Referral Reason:   Specialty Services Required    Requested Specialty:   Physical Therapy   No orders of the defined types were placed in this encounter.   Discussed warning signs or symptoms. Please see discharge instructions. Patient expresses understanding.   The above documentation has been reviewed and is accurate and complete Lynne Leader, M.D.

## 2020-09-27 ENCOUNTER — Ambulatory Visit (INDEPENDENT_AMBULATORY_CARE_PROVIDER_SITE_OTHER): Payer: No Typology Code available for payment source | Admitting: Family Medicine

## 2020-09-27 ENCOUNTER — Other Ambulatory Visit: Payer: Self-pay

## 2020-09-27 ENCOUNTER — Encounter: Payer: Self-pay | Admitting: Family Medicine

## 2020-09-27 VITALS — BP 128/83 | HR 86 | Ht 63.5 in | Wt 255.4 lb

## 2020-09-27 DIAGNOSIS — M5441 Lumbago with sciatica, right side: Secondary | ICD-10-CM

## 2020-09-27 DIAGNOSIS — M5442 Lumbago with sciatica, left side: Secondary | ICD-10-CM | POA: Diagnosis not present

## 2020-09-27 NOTE — Patient Instructions (Addendum)
Thank you for coming in today.  Heating pad can help.   Continue medicines as needed.   If you worsen let me know. I can add medicine.   Plan for PT.   I've referred you to Physical Therapy.  Let us know if you don't hear from them in one week.   TENS UNIT: This is helpful for muscle pain and spasm.   Search and Purchase a TENS 7000 2nd edition at  www.tenspros.com or www.[email or web address] It should be less than $30.     TENS unit instructions: Do not shower or bathe with the unit on . Turn the unit off before removing electrodes or batteries . If the electrodes lose stickiness add a drop of water to the electrodes after they are disconnected from the unit and place on plastic sheet. If you continued to have difficulty, call the TENS unit company to purchase more electrodes. . Do not apply lotion on the skin area prior to use. Make sure the skin is clean and dry as this will help prolong the life of the electrodes. . After use, always check skin for unusual red areas, rash or other skin difficulties. If there are any skin problems, does not apply electrodes to the same area. . Never remove the electrodes from the unit by pulling the wires. . Do not use the TENS unit or electrodes other than as directed. . Do not change electrode placement without consultating your therapist or physician. Marland Kitchen Keep 2 fingers with between each electrode. . Wear time ratio is 2:1, on to off times.    For example on for 30 minutes off for 15 minutes and then on for 30 minutes off for 15 minutes   Recheck in 6 weeks if not better.   If things are not getting better

## 2020-11-03 NOTE — Progress Notes (Signed)
Subjective:    Patient ID: Brenda [name], female    DOB: 1985-09-16, 35 y.o.   MRN: [id number]  HPI The patient is here for an acute visit for feeling overwhelmed and anxious.  She is a lot going on right now.  She just put in her 2 week notice.  She has a new job.  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  She did not really like her job.  She had no contact with anyone working from home and felt isolated.  She has gained weight.  She went to give her 2-week notice because she starts her new job in a couple of weeks and her supervisor did not accept it.  She had her resign immediately and therefore did not get paid these 2 weeks.  This is causing increased stress.  She has stress with her children's father.  She does not like where she lives and is hoping to improve her credit score so that she can move.  She is having very heavy menses and likely has anemia.  She is following with her gyn and has a [place] scheduled to look at her fibroids.  She finds her self feeling frustrated and irritable all the time and it concerns her because she feels this way towards her kids.  She has a lack of focus working from home - she is not sure if that is from not liking what she is doing or her anxiety/stress.    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  [employer] - special needs help.   Has been on meds in the past - Did not like sertraline.  Was on lexapro  - that was better but she did not take regularly.     Medications and allergies reviewed with patient and updated if appropriate.  Patient Active Problem List   Diagnosis Date Noted  . Acute midline low back pain with bilateral sciatica 09/18/2020  . Status post repeat low transverse cesarean section 09/29/2018  . Maternal obesity affecting pregnancy, antepartum 03/24/2018  . Allergic rhinitis 02/01/2017  . Genital herpes simplex 02/01/2017  . Prediabetes 11/24/2016  . Vitamin D  deficiency 11/24/2016  . S/P primary low transverse C-section 02/28/2016  . Obesity with body mass index 30 or greater 08/31/2015  . Seasonal asthma 08/06/2011  . Morbid obesity ([place]) 08/06/2011  . Fibroids 08/06/2011    Current Outpatient Medications on File Prior to Visit  Medication Sig Dispense Refill  . albuterol (VENTOLIN HFA) 108 (90 Base) MCG/ACT inhaler Inhale 1-2 puffs into the lungs every 6 (six) hours as needed for wheezing or shortness of breath. 6.7 g 5  . fluticasone (FLONASE) 50 MCG/ACT nasal spray Place 2 sprays into both nostrils daily. 16 g 6  . loratadine (CLARITIN) 10 MG tablet Claritin    . methocarbamol (ROBAXIN) 500 MG tablet Take 1 tablet (500 mg total) by mouth every 6 (six) hours as needed for muscle spasms. 30 tablet 0  . methylPREDNISolone (MEDROL DOSEPAK) 4 MG TBPK tablet 24 mg PO on day 1, then decr. by 4 mg/day x5 days 21 tablet 0  . montelukast (SINGULAIR) 10 MG tablet TAKE 1 TABLET BY MOUTH EVERYDAY AT BEDTIME 90 tablet 1  . norethindrone-ethinyl estradiol (CYCLAFEM) 0.5/0.75/1-35 MG-MCG tablet Alyacen 1/35 (28) 1 mg-35 mcg tablet  TAKE 1 TABLET BY MOUTH DAILY.    Marland Kitchen Olopatadine HCl 0.2 % SOLN Apply 1 drop to  eye in the morning and at bedtime. 2.5 mL 5  . pantoprazole (PROTONIX) 40 MG tablet TAKE 1 TABLET BY MOUTH EVERY DAY 90 tablet 0  . Prenatal Vit-Fe Fumarate-FA (PRENATAL MULTIVITAMIN) TABS tablet Take 1 tablet by mouth daily.     . valACYclovir (VALTREX) 500 MG tablet Take 500 mg by mouth daily.    . tranexamic acid (LYSTEDA) 650 MG TABS tablet Take 1,300 mg by mouth every 8 (eight) hours as needed.     No current facility-administered medications on file prior to visit.    Past Medical History:  Diagnosis Date  . Abnormal Pap smear 2007   colpo  . Asthma    childhood - PRN inhaler  . Fibroid    4 fibroids   . Gestational diabetes    first pregnancy, diet controlled  . H/O chlamydia infection 2010  . H/O varicella   . Mass of neck 03/02/2014   . Obese   . Rubella non-immune status 01/11/2012  . Vaginal Pap smear, abnormal    biopsy - normal     Past Surgical History:  Procedure Laterality Date  . CESAREAN SECTION N/A 02/28/2016   Procedure: CESAREAN SECTION;  Surgeon: [name], MD;  Location: [facility];  Service: Obstetrics;  Laterality: N/A;  . CESAREAN SECTION WITH BILATERAL TUBAL LIGATION Bilateral 09/29/2018   Procedure: REPEAT CESAREAN SECTION WITH BILATERAL TUBAL LIGATION;  Surgeon: [name], MD;  Location: [facility];  Service: Obstetrics;  Laterality: Bilateral;    Social History   Socioeconomic History  . Marital status: Single    Spouse name: Not on file  . Number of children: Not on file  . Years of education: Not on file  . Highest education level: Not on file  Occupational History  . Not on file  Tobacco Use  . Smoking status: Never Smoker  . Smokeless tobacco: Never Used  Vaping Use  . Vaping Use: Never used  Substance and Sexual Activity  . Alcohol use: Yes    Comment: occasional  . Drug use: No  . Sexual activity: Yes    Birth control/protection: None    Comment: sept 2017 - removed   Other Topics Concern  . Not on file  Social History Narrative   Engaged, 39 year old daughter   Social Determinants of Health   Financial Resource Strain: Not on file  Food Insecurity: Not on file  Transportation Needs: Not on file  Physical Activity: Not on file  Stress: Not on file  Social Connections: Not on file    Family History  Problem Relation Age of Onset  . Hypertension Mother        diet controlled no meds  . Hypertension Father   . Alcohol abuse Father   . Heart disease Maternal Aunt   . Mental illness Maternal Aunt        bipolar  . Diabetes Maternal Aunt   . Heart disease Maternal Grandmother   . Hypertension Maternal Grandmother   . Thrombophlebitis Maternal Grandmother   . Diabetes Maternal Grandmother   . Stroke Maternal Grandmother   . Hypertension  Paternal Grandmother   . Heart disease Paternal Grandmother   . Asthma Cousin   . Cancer Cousin   . Heart disease Cousin        congenital heart disease  . Learning disabilities Cousin   . Anesthesia problems Neg Hx     Review of Systems  Constitutional: Positive for fatigue.  Respiratory: Negative for shortness of  breath.   Cardiovascular: Negative for chest pain and palpitations.  Genitourinary: Positive for menstrual problem (heavy periods ).  Neurological: Positive for dizziness and light-headedness.  Psychiatric/Behavioral: Positive for dysphoric mood (? depressed) and sleep disturbance. The patient is nervous/anxious.        Objective:   Vitals:   11/04/20 1040  BP: 132/70  Pulse: 88  Temp: 98.2 F (36.8 C)  SpO2: 97%   BP Readings from Last 3 Encounters:  11/04/20 132/70  09/27/20 128/83  09/18/20 136/84   Wt Readings from Last 3 Encounters:  11/04/20 256 lb (116.1 kg)  09/27/20 255 lb 6.4 oz (115.8 kg)  09/18/20 251 lb (113.9 kg)   Body mass index is 44.64 kg/m.   Physical Exam Constitutional:      General: She is not in acute distress.    Appearance: Normal appearance. She is not ill-appearing.  HENT:     Head: Normocephalic and atraumatic.  Skin:    General: Skin is warm and dry.  Neurological:     Mental Status: She is alert.  Psychiatric:        Mood and Affect: Mood normal.        Behavior: Behavior normal.        Thought Content: Thought content normal.        Judgment: Judgment normal.            Assessment & Plan:    See Problem List for Assessment and Plan of chronic medical problems.    This visit occurred during the SARS-CoV-2 public health emergency.  Safety protocols were in place, including screening questions prior to the visit, additional usage of staff PPE, and extensive cleaning of exam room while observing appropriate contact time as indicated for disinfecting solutions.

## 2020-11-04 ENCOUNTER — Other Ambulatory Visit: Payer: Self-pay

## 2020-11-04 ENCOUNTER — Ambulatory Visit (INDEPENDENT_AMBULATORY_CARE_PROVIDER_SITE_OTHER): Payer: No Typology Code available for payment source | Admitting: Internal Medicine

## 2020-11-04 ENCOUNTER — Encounter: Payer: Self-pay | Admitting: Internal Medicine

## 2020-11-04 DIAGNOSIS — F419 Anxiety disorder, unspecified: Secondary | ICD-10-CM | POA: Insufficient documentation

## 2020-11-04 MED ORDER — ESCITALOPRAM OXALATE 10 MG PO TABS: 10.0000 mg | ORAL_TABLET | Freq: Every day | ORAL | 5 refills | Status: DC

## 2020-11-04 NOTE — Patient Instructions (Addendum)
  Medications changes include :   Start lexapro 10 mg daily  Your prescription(s) have been submitted to your pharmacy. Please take as directed and contact our office if you believe you are having problem(s) with the medication(s).     Please followup in 6 weeks - this can be virtual

## 2020-11-04 NOTE — Assessment & Plan Note (Signed)
Acute She is overwhelmed, stressed and anxiety.  She does not think she has depression Stress from many areas and she has become irritable, frustrated and has been short with her kids She thinks she needs to take something to help and I agree Start lexapro 10 mg daily F/u in 6 weeks, sooner if needed - will do a virtual visit since she will be starting a new job

## 2020-11-26 ENCOUNTER — Other Ambulatory Visit: Payer: Self-pay | Admitting: Internal Medicine

## 2020-12-15 NOTE — Progress Notes (Signed)
Virtual Visit via Video Note  I connected with Brenda [name] on 12/15/20 at 10:40 AM EDT by a video enabled telemedicine application and verified that I am speaking with the correct person using two identifiers.   I discussed the limitations of evaluation and management by telemedicine and the availability of in person appointments. The patient expressed understanding and agreed to proceed.  Present for the visit:  Myself, Dr Billey Gosling, Delton See.  The patient is currently at home and I am in the office.    No referring provider.    History of Present Illness: She is here for follow up of her anxiety.   6 weeks ago started lexparo  For anxiety/feeling overwhelmed.  She is taking it daily.  It has helped, but does not feel like it is quite enough.  She does feel less edgy, but still feels anxious and overwhelmed at times.  She does feel that the medication is helping her handle this better.  She denies any depression.  Her attention is still a concern-she did start doing a new job and she has difficulty keeping her attention.    She gets PMS but has not had a cycle in two months.  She does have irregular periods and has missed periods in the past so this is not new.  She is not sure if she needs to follow-up with her gynecologist.  They had given her medication for this in the past.  She is concerned because she still feels very tired.  She is wonders about her iron levels, which have been low in the past.  Started new job.  Review of Systems  Cardiovascular: Negative for chest pain and palpitations.  Psychiatric/Behavioral: Negative for depression. The patient is nervous/anxious.        Some issues with attention, sleep is a little better      Social History   Socioeconomic History  . Marital status: Single    Spouse name: Not on file  . Number of children: Not on file  . Years of education: Not on file  . Highest education level: Not on file  Occupational History   . Not on file  Tobacco Use  . Smoking status: Never Smoker  . Smokeless tobacco: Never Used  Vaping Use  . Vaping Use: Never used  Substance and Sexual Activity  . Alcohol use: Yes    Comment: occasional  . Drug use: No  . Sexual activity: Yes    Birth control/protection: None    Comment: sept 2017 - removed   Other Topics Concern  . Not on file  Social History Narrative   Engaged, 18 year old daughter   Social Determinants of Health   Financial Resource Strain: Not on file  Food Insecurity: Not on file  Transportation Needs: Not on file  Physical Activity: Not on file  Stress: Not on file  Social Connections: Not on file     Observations/Objective: Appears well in NAD Breathing normally Mood and affect.  Normal  Assessment and Plan:  See Problem List for Assessment and Plan of chronic medical problems.   Follow Up Instructions:    I discussed the assessment and treatment plan with the patient. The patient was provided an opportunity to ask questions and all were answered. The patient agreed with the plan and demonstrated an understanding of the instructions.   The patient was advised to call back or seek an in-person evaluation if the symptoms worsen or if the condition fails to  improve as anticipated.    Binnie Rail, MD

## 2020-12-16 ENCOUNTER — Encounter: Payer: Self-pay | Admitting: Internal Medicine

## 2020-12-16 ENCOUNTER — Other Ambulatory Visit: Payer: Self-pay

## 2020-12-16 ENCOUNTER — Telehealth (INDEPENDENT_AMBULATORY_CARE_PROVIDER_SITE_OTHER): Payer: 59 | Admitting: Internal Medicine

## 2020-12-16 DIAGNOSIS — E559 Vitamin D deficiency, unspecified: Secondary | ICD-10-CM

## 2020-12-16 DIAGNOSIS — R7303 Prediabetes: Secondary | ICD-10-CM | POA: Diagnosis not present

## 2020-12-16 DIAGNOSIS — R41840 Attention and concentration deficit: Secondary | ICD-10-CM | POA: Insufficient documentation

## 2020-12-16 DIAGNOSIS — F419 Anxiety disorder, unspecified: Secondary | ICD-10-CM

## 2020-12-16 DIAGNOSIS — D649 Anemia, unspecified: Secondary | ICD-10-CM | POA: Diagnosis not present

## 2020-12-16 DIAGNOSIS — R5383 Other fatigue: Secondary | ICD-10-CM

## 2020-12-16 MED ORDER — ESCITALOPRAM OXALATE 20 MG PO TABS: 20.0000 mg | ORAL_TABLET | Freq: Every day | ORAL | 5 refills | Status: DC

## 2020-12-16 MED ORDER — BUPROPION HCL ER (XL) 150 MG PO TB24: ORAL_TABLET | ORAL | 3 refills | Status: DC

## 2020-12-16 NOTE — Assessment & Plan Note (Signed)
Chronic Check A1c 

## 2020-12-16 NOTE — Assessment & Plan Note (Signed)
Subacute She is still experiencing fatigue We will check blood work to rule out potential causes-CBC, iron panel, TSH, CMP She has vitamin D deficiency and will check vitamin D level

## 2020-12-16 NOTE — Assessment & Plan Note (Signed)
Chronic Has had anemia with her last blood work Check CBC, iron panel ?  Contributing to her fatigue

## 2020-12-16 NOTE — Assessment & Plan Note (Signed)
Chronic Check vitamin D level 

## 2020-12-16 NOTE — Assessment & Plan Note (Signed)
Subacute Has been on Lexapro 10 mg daily for 6 weeks and is tolerating the medication well and it has helped. Has improved her stress, anxiety and feeling of overwhelmed-she is handling situations better.  She still feels she is on edge at times We both agree she would benefit from an increased dose in the Lexapro-increase to 20 mg daily Follow-up in 8 weeks

## 2020-12-16 NOTE — Assessment & Plan Note (Signed)
Subacute She states she has difficulty with her attention She is working from home which is started new job-likely both are contributing Will start Wellbutrin 150 mg XL daily and after 1 week increase to 300 mg daily Reassess in 8 weeks

## 2021-01-15 ENCOUNTER — Telehealth (HOSPITAL_COMMUNITY): Payer: Self-pay | Admitting: Emergency Medicine

## 2021-01-15 ENCOUNTER — Other Ambulatory Visit: Payer: Self-pay

## 2021-01-15 ENCOUNTER — Encounter (HOSPITAL_COMMUNITY): Payer: Self-pay

## 2021-01-15 ENCOUNTER — Ambulatory Visit (HOSPITAL_COMMUNITY)
Admission: EM | Admit: 2021-01-15 | Discharge: 2021-01-15 | Disposition: A | Discharge status: Home / Self Care | Payer: 59 | Attending: Physician Assistant | Admitting: Physician Assistant

## 2021-01-15 DIAGNOSIS — H60501 Unspecified acute noninfective otitis externa, right ear: Secondary | ICD-10-CM | POA: Diagnosis not present

## 2021-01-15 DIAGNOSIS — H9203 Otalgia, bilateral: Secondary | ICD-10-CM

## 2021-01-15 MED ORDER — OFLOXACIN 0.3 % OT SOLN: 5.0000 [drp] | Freq: Every day | OTIC | 0 refills | Status: DC

## 2021-01-15 NOTE — ED Triage Notes (Signed)
Pt presents with bilateral ear pain that is causing a dull headache since yesterday.

## 2021-01-15 NOTE — ED Provider Notes (Signed)
[facility]    CSN: [id number] Arrival date & time: 01/15/21  1247      History   Chief Complaint Chief Complaint  Patient presents with  . Otalgia    HPI Brenda [name] is a 35 y.o. female.   Patient presents today with a several day history of right otalgia.  Reports symptoms have spread and she is now experiencing some pain in her left ear as well.  She denies any recent illness but did have an episode of sore throat several days ago that has since resolved.  She denies any cough, nasal congestion, fever, otorrhea, changes in hearing.  She denies any recent swimming or airplane travel.  She does use Q-tips occasionally.  She denies history of recurrent ear infections and has not seen ENT in the past.  She does report some associated dizziness but denies any syncope.  She has a history of allergies and has been taking allergy medication as prescribed including Flonase.  She reports pain is rated 6 on a 0-10 pain scale, localized to right ear with radiation posteriorly, described as aching, worse with manipulation of ear or laying on that side at night, no alleviating factors identified.  She has tried over-the-counter eardrops with minimal improvement of symptoms.     Past Medical History:  Diagnosis Date  . Abnormal Pap smear 2007   [facility]  . Asthma    childhood - PRN inhaler  . Fibroid    4 fibroids   . Gestational diabetes    first pregnancy, diet controlled  . H/O chlamydia infection 2010  . H/O varicella   . Mass of neck 03/02/2014  . Obese   . Rubella non-immune status 01/11/2012  . Vaginal Pap smear, abnormal    biopsy - normal     Patient Active Problem List   Diagnosis Date Noted  . Fatigue 12/16/2020  . Anemia 12/16/2020  . Impaired attention 12/16/2020  . Anxiety 11/04/2020  . Acute midline low back pain with bilateral sciatica 09/18/2020  . Status post repeat low transverse cesarean section 09/29/2018  . Maternal obesity affecting pregnancy,  antepartum 03/24/2018  . Allergic rhinitis 02/01/2017  . Genital herpes simplex 02/01/2017  . Prediabetes 11/24/2016  . Vitamin D deficiency 11/24/2016  . S/P primary low transverse C-section 02/28/2016  . Obesity with body mass index 30 or greater 08/31/2015  . Seasonal asthma 08/06/2011  . Morbid obesity ([place]) 08/06/2011  . Fibroids 08/06/2011    Past Surgical History:  Procedure Laterality Date  . CESAREAN SECTION N/A 02/28/2016   Procedure: CESAREAN SECTION;  Surgeon: [name], MD;  Location: [facility];  Service: Obstetrics;  Laterality: N/A;  . CESAREAN SECTION WITH BILATERAL TUBAL LIGATION Bilateral 09/29/2018   Procedure: REPEAT CESAREAN SECTION WITH BILATERAL TUBAL LIGATION;  Surgeon: [name], MD;  Location: [facility];  Service: Obstetrics;  Laterality: Bilateral;    OB History    Gravida  3   Para  3   Term  3   Preterm  0   AB  0   Living  3     SAB  0   IAB  0   Ectopic  0   Multiple  0   Live Births  3            Home Medications    Prior to Admission medications   Medication Sig Start Date End Date Taking? Authorizing Provider  ofloxacin (FLOXIN) 0.3 % OTIC solution Place 5 drops into  both ears daily. 01/15/21  Yes Frutoso Dimare K, PA-C  albuterol (VENTOLIN HFA) 108 (90 Base) MCG/ACT inhaler Inhale 1-2 puffs into the lungs every 6 (six) hours as needed for wheezing or shortness of breath. 11/22/19   Binnie Rail, MD  buPROPion (WELLBUTRIN XL) 150 MG 24 hr tablet Take 1 daily in morning for one week, then increase to 2 tabs daily in morning 12/16/20   Burns, [name], MD  escitalopram (LEXAPRO) 20 MG tablet Take 1 tablet (20 mg total) by mouth daily. 12/16/20   Binnie Rail, MD  fluticasone (FLONASE) 50 MCG/ACT nasal spray Place 2 sprays into both nostrils daily. 11/22/19   Binnie Rail, MD  loratadine (CLARITIN) 10 MG tablet Claritin    [provider]  methocarbamol (ROBAXIN) 500 MG tablet Take 1 tablet (500  mg total) by mouth every 6 (six) hours as needed for muscle spasms. 09/18/20   Burns, [name], MD  montelukast (SINGULAIR) 10 MG tablet TAKE 1 TABLET BY MOUTH EVERYDAY AT BEDTIME 05/20/20   Binnie Rail, MD  Olopatadine HCl 0.2 % SOLN Apply 1 drop to eye in the morning and at bedtime. 11/22/19   Binnie Rail, MD  pantoprazole (PROTONIX) 40 MG tablet TAKE 1 TABLET BY MOUTH EVERY DAY 05/20/20   Binnie Rail, MD  Prenatal Vit-Fe Fumarate-FA (PRENATAL MULTIVITAMIN) TABS tablet Take 1 tablet by mouth daily.     [provider]  tranexamic acid (LYSTEDA) 650 MG TABS tablet Take 1,300 mg by mouth every 8 (eight) hours as needed. 10/10/20   [provider]  valACYclovir (VALTREX) 500 MG tablet Take 500 mg by mouth daily. 04/22/20   [provider]    Family History Family History  Problem Relation Age of Onset  . Hypertension Mother        diet controlled no meds  . Hypertension Father   . Alcohol abuse Father   . Heart disease Maternal Aunt   . Mental illness Maternal Aunt        bipolar  . Diabetes Maternal Aunt   . Heart disease Maternal Grandmother   . Hypertension Maternal Grandmother   . Thrombophlebitis Maternal Grandmother   . Diabetes Maternal Grandmother   . Stroke Maternal Grandmother   . Hypertension Paternal Grandmother   . Heart disease Paternal Grandmother   . Asthma Cousin   . Cancer Cousin   . Heart disease Cousin        congenital heart disease  . Learning disabilities Cousin   . Anesthesia problems Neg Hx     Social History Social History   Tobacco Use  . Smoking status: Never Smoker  . Smokeless tobacco: Never Used  Vaping Use  . Vaping Use: Never used  Substance Use Topics  . Alcohol use: Yes    Comment: occasional  . Drug use: No     Allergies   Sertraline   Review of Systems Review of Systems  Constitutional: Negative for activity change, appetite change, fatigue and fever.  HENT: Positive for ear pain. Negative for  congestion, ear discharge, hearing loss, sinus pressure, sneezing and sore throat.   Respiratory: Negative for cough and shortness of breath.   Cardiovascular: Negative for chest pain.  Gastrointestinal: Negative for abdominal pain, diarrhea, nausea and vomiting.  Musculoskeletal: Negative for arthralgias and myalgias.  Neurological: Positive for dizziness. Negative for light-headedness and headaches.     Physical Exam Triage Vital Signs ED Triage Vitals [01/15/21 1316]  Enc Vitals Group  BP 136/80     Pulse Rate 96     Resp      Temp 98.1 F (36.7 C)     Temp Source Oral     SpO2 100 %     Weight      Height      Head Circumference      Peak Flow      Pain Score 6     Pain Loc      Pain Edu?      Excl. in [place]?    No data found.  Updated Vital Signs BP 136/80 (BP Location: Right Arm)   Pulse 96   Temp 98.1 F (36.7 C) (Oral)   SpO2 100%   Visual Acuity Right Eye Distance:   Left Eye Distance:   Bilateral Distance:    Right Eye Near:   Left Eye Near:    Bilateral Near:     Physical Exam Vitals reviewed.  Constitutional:      General: She is awake. She is not in acute distress.    Appearance: Normal appearance. She is not ill-appearing.     Comments: Very pleasant female appears stated age in no acute distress  HENT:     Head: Normocephalic and atraumatic.     Right Ear: Ear canal and external ear normal. Swelling and tenderness present. No drainage. Tympanic membrane is bulging. Tympanic membrane is not erythematous.     Left Ear: Tympanic membrane, ear canal and external ear normal. Tympanic membrane is not erythematous or bulging.     Ears:     Comments: Right ear: Pain with palpation of tragus and manipulation of external ear.  Ear canal is erythematous and edematous without otorrhea.  TM appears bulging but without erythema.    Nose:     Right Sinus: No maxillary sinus tenderness or frontal sinus tenderness.     Left Sinus: No maxillary sinus  tenderness or frontal sinus tenderness.     Mouth/Throat:     Pharynx: Uvula midline. No oropharyngeal exudate or posterior oropharyngeal erythema.  Cardiovascular:     Rate and Rhythm: Normal rate and regular rhythm.     Heart sounds: Normal heart sounds. No murmur heard.   Pulmonary:     Effort: Pulmonary effort is normal.     Breath sounds: Normal breath sounds. No wheezing, rhonchi or rales.     Comments: Clear to auscultation bilaterally Lymphadenopathy:     Head:     Right side of head: No submental, submandibular or tonsillar adenopathy.     Left side of head: No submental, submandibular or tonsillar adenopathy.     Cervical: No cervical adenopathy.  Psychiatric:        Behavior: Behavior is cooperative.      UC Treatments / Results  Labs (all labs ordered are listed, but only abnormal results are displayed) Labs Reviewed - No data to display  EKG   Radiology No results found.  Procedures Procedures (including critical care time)  Medications Ordered in UC Medications - No data to display  Initial Impression / Assessment and Plan / UC Course  I have reviewed the triage vital signs and the nursing notes.  Pertinent labs & imaging results that were available during my care of the patient were reviewed by me and considered in my medical decision making (see chart for details).     Otitis externa noted on exam.  Patient was started on ofloxacin daily.  Discussed that she should avoid putting  anything in her ear until symptoms resolve.  Discouraged the use of Q-tips in the future.  Recommended she avoid submerging her head underwater until symptoms resolve.  Discussed that if symptoms persist despite medication she can consider seeing an ENT and was given contact information for local ENT group.  Recommended she alternate Tylenol and ibuprofen for pain relief.  Discussed alarm symptoms that would warrant emergent evaluation.  Strict return precautions given to which  patient expressed understanding.  Final Clinical Impressions(s) / UC Diagnoses   Final diagnoses:  Acute otitis externa of right ear, unspecified type  Otalgia of both ears     Discharge Instructions     Use antibiotic eardrops at night.  After putting eardrops medicine in please keep the ear facing upward for 5 to 10 minutes to ensure this makes it all the way down the ear canal.  You can use Tylenol and ibuprofen for additional symptom relief.  If you have any worsening symptoms please return for reevaluation.  If you continue with your pain it may be worthwhile to follow-up with ear nose and throat doctor.  Do not use Q-tips.  Avoid putting anything in your ear and her symptoms improved.  Avoid putting her head underwater until symptoms resolve.    ED Prescriptions    Medication Sig Dispense Auth. Provider   ofloxacin (FLOXIN) 0.3 % OTIC solution Place 5 drops into both ears daily. 5 mL Jayceon Troy K, PA-C     PDMP not reviewed this encounter.   Terrilee Croak, PA-C 01/15/21 1336

## 2021-01-15 NOTE — Discharge Instructions (Addendum)
Use antibiotic eardrops at night.  After putting eardrops medicine in please keep the ear facing upward for 5 to 10 minutes to ensure this makes it all the way down the ear canal.  You can use Tylenol and ibuprofen for additional symptom relief.  If you have any worsening symptoms please return for reevaluation.  If you continue with your pain it may be worthwhile to follow-up with ear nose and throat doctor.  Do not use Q-tips.  Avoid putting anything in your ear and her symptoms improved.  Avoid putting her head underwater until symptoms resolve.

## 2021-01-19 ENCOUNTER — Ambulatory Visit (HOSPITAL_COMMUNITY)
Admission: EM | Admit: 2021-01-19 | Discharge: 2021-01-19 | Disposition: A | Discharge status: Home / Self Care | Payer: 59 | Attending: Medical Oncology | Admitting: Medical Oncology

## 2021-01-19 ENCOUNTER — Encounter (HOSPITAL_COMMUNITY): Payer: Self-pay

## 2021-01-19 DIAGNOSIS — R21 Rash and other nonspecific skin eruption: Secondary | ICD-10-CM | POA: Diagnosis not present

## 2021-01-19 DIAGNOSIS — L02419 Cutaneous abscess of limb, unspecified: Secondary | ICD-10-CM | POA: Diagnosis not present

## 2021-01-19 DIAGNOSIS — L03119 Cellulitis of unspecified part of limb: Secondary | ICD-10-CM | POA: Insufficient documentation

## 2021-01-19 MED ORDER — VALACYCLOVIR HCL 1 G PO TABS: 1000.0000 mg | ORAL_TABLET | Freq: Three times a day (TID) | ORAL | 0 refills | Status: AC

## 2021-01-19 MED ORDER — DOXYCYCLINE HYCLATE 100 MG PO CAPS: 100.0000 mg | ORAL_CAPSULE | Freq: Two times a day (BID) | ORAL | 0 refills | Status: DC

## 2021-01-19 NOTE — ED Provider Notes (Addendum)
[facility]    CSN: [id number] Arrival date & time: 01/19/21  1628      History   Chief Complaint Chief Complaint  Patient presents with   Rash    HPI Brenda [name] is a 35 y.o. female.   HPI  Rash: Patient states that she developed a pustule of her right mid lower leg a few days ago.  She states that the area is warm and red and painful.  She feels that the skin is swollen in the area.  She reports that her partner had what appeared to be a staph infection recently and she is concerned that she may have caught this from him.  She has tried to pop it herself but it has not erupted.  No fevers but she has had some chills.  She also reports a itchy to painful rash of her chest that developed today. No new products or medications. *Patient denies chance of pregnancy and states that she is followed by OB/GYN closely and has an ultrasound tomorrow for her amenorrhea which is chronic. Past Medical History:  Diagnosis Date   Abnormal Pap smear 2007   colpo   Asthma    childhood - PRN inhaler   Fibroid    4 fibroids    Gestational diabetes    first pregnancy, diet controlled   H/O chlamydia infection 2010   H/O varicella    Mass of neck 03/02/2014   Obese    Rubella non-immune status 01/11/2012   Vaginal Pap smear, abnormal    biopsy - normal     Patient Active Problem List   Diagnosis Date Noted   Fatigue 12/16/2020   Anemia 12/16/2020   Impaired attention 12/16/2020   Anxiety 11/04/2020   Acute midline low back pain with bilateral sciatica 09/18/2020   Status post repeat low transverse cesarean section 09/29/2018   Maternal obesity affecting pregnancy, antepartum 03/24/2018   Allergic rhinitis 02/01/2017   Genital herpes simplex 02/01/2017   Prediabetes 11/24/2016   Vitamin D deficiency 11/24/2016   S/P primary low transverse C-section 02/28/2016   Obesity with body mass index 30 or greater 08/31/2015   Seasonal asthma 08/06/2011   Morbid obesity ([place])  08/06/2011   Fibroids 08/06/2011    Past Surgical History:  Procedure Laterality Date   CESAREAN SECTION N/A 02/28/2016   Procedure: CESAREAN SECTION;  Surgeon: [name], MD;  Location: [facility];  Service: Obstetrics;  Laterality: N/A;   CESAREAN SECTION WITH BILATERAL TUBAL LIGATION Bilateral 09/29/2018   Procedure: REPEAT CESAREAN SECTION WITH BILATERAL TUBAL LIGATION;  Surgeon: [name], MD;  Location: [facility];  Service: Obstetrics;  Laterality: Bilateral;    OB History     Gravida  3   Para  3   Term  3   Preterm  0   AB  0   Living  3      SAB  0   IAB  0   Ectopic  0   Multiple  0   Live Births  3            Home Medications    Prior to Admission medications   Medication Sig Start Date End Date Taking? Authorizing Provider  albuterol (VENTOLIN HFA) 108 (90 Base) MCG/ACT inhaler Inhale 1-2 puffs into the lungs every 6 (six) hours as needed for wheezing or shortness of breath. 11/22/19   Binnie Rail, MD  buPROPion (WELLBUTRIN XL) 150 MG 24 hr tablet Take 1 daily  in morning for one week, then increase to 2 tabs daily in morning 12/16/20   Binnie Rail, MD  escitalopram (LEXAPRO) 20 MG tablet Take 1 tablet (20 mg total) by mouth daily. 12/16/20   Binnie Rail, MD  fluticasone (FLONASE) 50 MCG/ACT nasal spray Place 2 sprays into both nostrils daily. 11/22/19   Binnie Rail, MD  loratadine (CLARITIN) 10 MG tablet Claritin    [provider]  methocarbamol (ROBAXIN) 500 MG tablet Take 1 tablet (500 mg total) by mouth every 6 (six) hours as needed for muscle spasms. 09/18/20   Binnie Rail, MD  montelukast (SINGULAIR) 10 MG tablet TAKE 1 TABLET BY MOUTH EVERYDAY AT BEDTIME 05/20/20   Burns, [name], MD  ofloxacin (FLOXIN) 0.3 % OTIC solution Place 5 drops into both ears daily. 01/15/21   Raspet, Derry Skill, PA-C  Olopatadine HCl 0.2 % SOLN Apply 1 drop to eye in the morning and at bedtime. 11/22/19   Binnie Rail, MD   pantoprazole (PROTONIX) 40 MG tablet TAKE 1 TABLET BY MOUTH EVERY DAY 05/20/20   Binnie Rail, MD  Prenatal Vit-Fe Fumarate-FA (PRENATAL MULTIVITAMIN) TABS tablet Take 1 tablet by mouth daily.     [provider]  tranexamic acid (LYSTEDA) 650 MG TABS tablet Take 1,300 mg by mouth every 8 (eight) hours as needed. 10/10/20   [provider]  valACYclovir (VALTREX) 500 MG tablet Take 500 mg by mouth daily. 04/22/20   [provider]    Family History Family History  Problem Relation Age of Onset   Hypertension Mother        diet controlled no meds   Hypertension Father    Alcohol abuse Father    Heart disease Maternal Aunt    Mental illness Maternal Aunt        bipolar   Diabetes Maternal Aunt    Heart disease Maternal Grandmother    Hypertension Maternal Grandmother    Thrombophlebitis Maternal Grandmother    Diabetes Maternal Grandmother    Stroke Maternal Grandmother    Hypertension Paternal Grandmother    Heart disease Paternal Grandmother    Asthma Cousin    Cancer Cousin    Heart disease Cousin        congenital heart disease   Learning disabilities Cousin    Anesthesia problems Neg Hx     Social History Social History   Tobacco Use   Smoking status: Never   Smokeless tobacco: Never  Vaping Use   Vaping Use: Never used  Substance Use Topics   Alcohol use: Yes    Comment: occasional   Drug use: No     Allergies   Sertraline   Review of Systems Review of Systems  As stated above in HPI Physical Exam Triage Vital Signs ED Triage Vitals  Enc Vitals Group     BP 01/19/21 1644 137/88     Pulse Rate 01/19/21 1644 86     Resp 01/19/21 1644 19     Temp 01/19/21 1644 98.5 F (36.9 C)     Temp Source 01/19/21 1644 Oral     SpO2 01/19/21 1644 99 %     Weight --      Height --      Head Circumference --      Peak Flow --      Pain Score 01/19/21 1642 5     Pain Loc --      Pain Edu? --  Excl. in GC? --    No data  found.  Updated Vital Signs BP 137/88 (BP Location: Right Arm)   Pulse 86   Temp 98.5 F (36.9 C) (Oral)   Resp 19   SpO2 99%   Physical Exam Vitals and nursing note reviewed.  Constitutional:      General: She is not in acute distress.    Appearance: Normal appearance. She is not ill-appearing, toxic-appearing or diaphoretic.  HENT:     Head: Normocephalic and atraumatic.  Skin:    General: Skin is warm.  Neurological:     General: No focal deficit present.     Mental Status: She is alert and oriented to person, place, and time.     Sensory: No sensory deficit.     Motor: No weakness.     Coordination: Coordination normal.     Gait: Gait normal.        UC Treatments / Results  Labs (all labs ordered are listed, but only abnormal results are displayed) Labs Reviewed - No data to display  EKG   Radiology No results found.  Procedures Incision and Drainage  Date/Time: 01/19/2021 5:24 PM Performed by: Hughie Closs, PA-C Authorized by: Hughie Closs, PA-C   Consent:    Consent obtained:  Verbal   Consent given by:  Patient   Risks, benefits, and alternatives were discussed: yes     Risks discussed:  Bleeding, incomplete drainage, pain and infection   Alternatives discussed:  Alternative treatment Universal protocol:    Patient identity confirmed:  Verbally with patient Location:    Type:  Abscess   Size:  2 cm   Location:  Lower extremity   Lower extremity location:  Leg   Leg location:  R lower leg Pre-procedure details:    Skin preparation:  Chlorhexidine Sedation:    Sedation type:  None Anesthesia:    Anesthesia method:  None Procedure type:    Complexity:  Simple Procedure details:    Incision types:  Stab incision   Incision depth:  Dermal   Drainage:  Bloody   Drainage amount:  Moderate   Wound treatment:  Wound left open   Packing materials:  None Post-procedure details:    Procedure completion:  Tolerated well, no  immediate complications (including critical care time)  Medications Ordered in UC Medications - No data to display  Initial Impression / Assessment and Plan / UC Course  I have reviewed the triage vital signs and the nursing notes.  Pertinent labs & imaging results that were available during my care of the patient were reviewed by me and considered in my medical decision making (see chart for details).     New.  Treating with doxycycline for likely staph infection while we await her wound culture to prevent further systemic complications.  I&D discussed and performed.  In addition we discussed potential shingles for her rash of her shoulder and chest. Sending in Valtrex for this as well. Discussed red flag signs and symptoms.  Final Clinical Impressions(s) / UC Diagnoses   Final diagnoses:  None   Discharge Instructions   None    ED Prescriptions   None    PDMP not reviewed this encounter.   Hughie Closs, PA-C 01/19/21 1725    Hughie Closs, PA-C 01/19/21 1727

## 2021-01-19 NOTE — ED Triage Notes (Signed)
Pt in with c/o rash on her chest for a few days  Pt also c/o pus filled bump on her right lower leg that she noticed a few days ago  Pt states the bump will not pop and pain is radiating to the back of her calf

## 2021-01-22 LAB — AEROBIC CULTURE W GRAM STAIN (SUPERFICIAL SPECIMEN)

## 2021-01-29 DIAGNOSIS — B029 Zoster without complications: Secondary | ICD-10-CM | POA: Insufficient documentation

## 2021-01-29 DIAGNOSIS — L03115 Cellulitis of right lower limb: Secondary | ICD-10-CM | POA: Insufficient documentation

## 2021-01-29 NOTE — Patient Instructions (Signed)
   Blood work was ordered.     Medications changes include :     Your prescription(s) have been submitted to your pharmacy. Please take as directed and contact our office if you believe you are having problem(s) with the medication(s).   A referral was ordered for        Someone from their office will call you to schedule an appointment.

## 2021-01-29 NOTE — Progress Notes (Signed)
Subjective:    Patient ID: Brenda [name], female    DOB: 07/20/1986, 35 y.o.   MRN: [id number]  HPI The patient is here for follow up from UC.   UC 6/8 - otallgia - dx otitis externa - ofloxacin ear drops  UC 6/12 - rash - developed a pustule on RLL a few days prior.  It was warm, red, swollen, painful.  Her partner had a staph infection.  No fever, but had some chills.  She had developed a rash on her chest that day.  Leg wound I& D'd.  Cultures showed staph infection.  Started on doxycycline.  ? Rash on chest - shingles - started on valtrex.        Medications and allergies reviewed with patient and updated if appropriate.  Patient Active Problem List   Diagnosis Date Noted   Fatigue 12/16/2020   Anemia 12/16/2020   Impaired attention 12/16/2020   Anxiety 11/04/2020   Acute midline low back pain with bilateral sciatica 09/18/2020   Status post repeat low transverse cesarean section 09/29/2018   Maternal obesity affecting pregnancy, antepartum 03/24/2018   Allergic rhinitis 02/01/2017   Genital herpes simplex 02/01/2017   Prediabetes 11/24/2016   Vitamin D deficiency 11/24/2016   S/P primary low transverse C-section 02/28/2016   Obesity with body mass index 30 or greater 08/31/2015   Seasonal asthma 08/06/2011   Morbid obesity ([place]) 08/06/2011   Fibroids 08/06/2011    Current Outpatient Medications on File Prior to Visit  Medication Sig Dispense Refill   albuterol (VENTOLIN HFA) 108 (90 Base) MCG/ACT inhaler Inhale 1-2 puffs into the lungs every 6 (six) hours as needed for wheezing or shortness of breath. 6.7 g 5   buPROPion (WELLBUTRIN XL) 150 MG 24 hr tablet Take 1 daily in morning for one week, then increase to 2 tabs daily in morning 60 tablet 3   doxycycline (VIBRAMYCIN) 100 MG capsule Take 1 capsule (100 mg total) by mouth 2 (two) times daily. 20 capsule 0   escitalopram (LEXAPRO) 20 MG tablet Take 1 tablet (20 mg total) by mouth daily. 30 tablet 5    fluticasone (FLONASE) 50 MCG/ACT nasal spray Place 2 sprays into both nostrils daily. 16 g 6   loratadine (CLARITIN) 10 MG tablet Claritin     methocarbamol (ROBAXIN) 500 MG tablet Take 1 tablet (500 mg total) by mouth every 6 (six) hours as needed for muscle spasms. 30 tablet 0   montelukast (SINGULAIR) 10 MG tablet TAKE 1 TABLET BY MOUTH EVERYDAY AT BEDTIME 90 tablet 1   ofloxacin (FLOXIN) 0.3 % OTIC solution Place 5 drops into both ears daily. 5 mL 0   Olopatadine HCl 0.2 % SOLN Apply 1 drop to eye in the morning and at bedtime. 2.5 mL 5   pantoprazole (PROTONIX) 40 MG tablet TAKE 1 TABLET BY MOUTH EVERY DAY 90 tablet 0   Prenatal Vit-Fe Fumarate-FA (PRENATAL MULTIVITAMIN) TABS tablet Take 1 tablet by mouth daily.      tranexamic acid (LYSTEDA) 650 MG TABS tablet Take 1,300 mg by mouth every 8 (eight) hours as needed.     No current facility-administered medications on file prior to visit.    Past Medical History:  Diagnosis Date   Abnormal Pap smear 2007   colpo   Asthma    childhood - PRN inhaler   Fibroid    4 fibroids    Gestational diabetes    first pregnancy, diet controlled   H/O chlamydia infection 2010  H/O varicella    Mass of neck 03/02/2014   Obese    Rubella non-immune status 01/11/2012   Vaginal Pap smear, abnormal    biopsy - normal     Past Surgical History:  Procedure Laterality Date   CESAREAN SECTION N/A 02/28/2016   Procedure: CESAREAN SECTION;  Surgeon: [name], MD;  Location: [facility];  Service: Obstetrics;  Laterality: N/A;   CESAREAN SECTION WITH BILATERAL TUBAL LIGATION Bilateral 09/29/2018   Procedure: REPEAT CESAREAN SECTION WITH BILATERAL TUBAL LIGATION;  Surgeon: [name], MD;  Location: [facility];  Service: Obstetrics;  Laterality: Bilateral;    Social History   Socioeconomic History   Marital status: Single    Spouse name: Not on file   Number of children: Not on file   Years of education: Not on file    Highest education level: Not on file  Occupational History   Not on file  Tobacco Use   Smoking status: Never   Smokeless tobacco: Never  Vaping Use   Vaping Use: Never used  Substance and Sexual Activity   Alcohol use: Yes    Comment: occasional   Drug use: No   Sexual activity: Yes    Birth control/protection: None    Comment: sept 2017 - removed   Other Topics Concern   Not on file  Social History Narrative   Engaged, 61 year old daughter   Social Determinants of Health   Financial Resource Strain: Not on file  Food Insecurity: Not on file  Transportation Needs: Not on file  Physical Activity: Not on file  Stress: Not on file  Social Connections: Not on file    Family History  Problem Relation Age of Onset   Hypertension Mother        diet controlled no meds   Hypertension Father    Alcohol abuse Father    Heart disease Maternal Aunt    Mental illness Maternal Aunt        bipolar   Diabetes Maternal Aunt    Heart disease Maternal Grandmother    Hypertension Maternal Grandmother    Thrombophlebitis Maternal Grandmother    Diabetes Maternal Grandmother    Stroke Maternal Grandmother    Hypertension Paternal Grandmother    Heart disease Paternal Grandmother    Asthma Cousin    Cancer Cousin    Heart disease Cousin        congenital heart disease   Learning disabilities Cousin    Anesthesia problems Neg Hx     Review of Systems     Objective:  There were no vitals filed for this visit. BP Readings from Last 3 Encounters:  01/19/21 137/88  01/15/21 136/80  11/04/20 132/70   Wt Readings from Last 3 Encounters:  11/04/20 256 lb (116.1 kg)  09/27/20 255 lb 6.4 oz (115.8 kg)  09/18/20 251 lb (113.9 kg)   There is no height or weight on file to calculate BMI.   Physical Exam    Constitutional: Appears well-developed and well-nourished. No distress.  HENT:  Head: Normocephalic and atraumatic.  Neck: Neck supple. No tracheal deviation present. No  thyromegaly present.  No cervical lymphadenopathy Cardiovascular: Normal rate, regular rhythm and normal heart sounds.   No murmur heard. No carotid bruit .  No edema Pulmonary/Chest: Effort normal and breath sounds normal. No respiratory distress. No has no wheezes. No rales.  Skin: Skin is warm and dry. Not diaphoretic.  Psychiatric: Normal mood and affect. Behavior is normal.  Assessment & Plan:    See Problem List for Assessment and Plan of chronic medical problems.    This visit occurred during the SARS-CoV-2 public health emergency.  Safety protocols were in place, including screening questions prior to the visit, additional usage of staff PPE, and extensive cleaning of exam room while observing appropriate contact time as indicated for disinfecting solutions.   This encounter was created in error - please disregard.

## 2021-01-30 ENCOUNTER — Encounter: Payer: 59 | Admitting: Internal Medicine

## 2021-01-30 DIAGNOSIS — Z0289 Encounter for other administrative examinations: Secondary | ICD-10-CM

## 2021-02-11 NOTE — Progress Notes (Signed)
Virtual Visit via Video Note  I connected with Brenda [name] on 02/11/21 at  7:50 AM EDT by a video enabled telemedicine application and verified that I am speaking with the correct person using two identifiers.   I discussed the limitations of evaluation and management by telemedicine and the availability of in person appointments. The patient expressed understanding and agreed to proceed.  Present for the visit:  Myself, Dr Billey Gosling, Delton See.  The patient is currently at home and I am in the office.    No referring provider.    History of Present Illness: She is here for follow up from 8 weeks ago.   Impaired attention - I started wellbutrin 150 mg xl 8 weeks and and she increased the dose to 300 mg after one week.  She is taking medication daily and feels like it has helped.  Anxiety - I increased her lexapro from 10-20 mg 8 weeks ago.  She did stop it - it causes dizziness-she thinks.  She was on the 10 mg for a while and it did not have any issues.  She is also having some ear issues and she is unsure if the dizziness is from medication or her ear/something else.Marland Kitchen    She was just diagnosed with PCOS.  She was put on metformin 500 mg qpm.  She is doing okay with that.  She is anemic and was advised to start iron supplementation, which she has not done.  She gets a static sound in her ear.  She has a throbbing sound sometimes in her ears.  Review of Systems  Constitutional:        No change in appeitie  Respiratory:  Negative for shortness of breath.   Cardiovascular:  Negative for chest pain and palpitations.  Gastrointestinal:  Negative for nausea.  Neurological:  Positive for dizziness. Negative for headaches.  Psychiatric/Behavioral:  Positive for depression. The patient is nervous/anxious.     Social History   Socioeconomic History   Marital status: Single    Spouse name: Not on file   Number of children: Not on file   Years of education: Not on file    Highest education level: Not on file  Occupational History   Not on file  Tobacco Use   Smoking status: Never   Smokeless tobacco: Never  Vaping Use   Vaping Use: Never used  Substance and Sexual Activity   Alcohol use: Yes    Comment: occasional   Drug use: No   Sexual activity: Yes    Birth control/protection: None    Comment: sept 2017 - removed   Other Topics Concern   Not on file  Social History Narrative   Engaged, 61 year old daughter   Social Determinants of Health   Financial Resource Strain: Not on file  Food Insecurity: Not on file  Transportation Needs: Not on file  Physical Activity: Not on file  Stress: Not on file  Social Connections: Not on file     Observations/Objective: Appears well in NAD Breathing normally Mood mildly dysthymic  Assessment and Plan:  See Problem List for Assessment and Plan of chronic medical problems.   Follow Up Instructions:    I discussed the assessment and treatment plan with the patient. The patient was provided an opportunity to ask questions and all were answered. The patient agreed with the plan and demonstrated an understanding of the instructions.   The patient was advised to call back or seek an in-person  evaluation if the symptoms worsen or if the condition fails to improve as anticipated.    Binnie Rail, MD

## 2021-02-12 ENCOUNTER — Encounter: Payer: Self-pay | Admitting: Internal Medicine

## 2021-02-12 ENCOUNTER — Telehealth (INDEPENDENT_AMBULATORY_CARE_PROVIDER_SITE_OTHER): Payer: 59 | Admitting: Internal Medicine

## 2021-02-12 DIAGNOSIS — F341 Dysthymic disorder: Secondary | ICD-10-CM

## 2021-02-12 DIAGNOSIS — F419 Anxiety disorder, unspecified: Secondary | ICD-10-CM

## 2021-02-12 DIAGNOSIS — D500 Iron deficiency anemia secondary to blood loss (chronic): Secondary | ICD-10-CM | POA: Diagnosis not present

## 2021-02-12 DIAGNOSIS — E282 Polycystic ovarian syndrome: Secondary | ICD-10-CM | POA: Diagnosis not present

## 2021-02-12 DIAGNOSIS — R41840 Attention and concentration deficit: Secondary | ICD-10-CM

## 2021-02-12 DIAGNOSIS — D509 Iron deficiency anemia, unspecified: Secondary | ICD-10-CM | POA: Insufficient documentation

## 2021-02-12 MED ORDER — ESCITALOPRAM OXALATE 20 MG PO TABS: 20.0000 mg | ORAL_TABLET | Freq: Every day | ORAL | 5 refills | Status: DC

## 2021-02-12 NOTE — Assessment & Plan Note (Signed)
New She is feeling mildly depressed Wellbutrin may help be helping some, but being back on the Lexapro would help more She is experiencing some dizziness and was concerned it is from the Lexapro, but more likely that is related to her iron deficiency anemia Discussed trying a different SSRI, but she would like to stick with the Lexapro since she has done well with that for the most part-advised to start 5 mg daily to see if she does okay with that and then increase back to 10 mg, which will help her anxiety and dysthymia

## 2021-02-12 NOTE — Assessment & Plan Note (Addendum)
Chronic Not controlled - has not been taking the lexapro daily because she is concerned it may be causing some dizziness, but this may be related to her iron deficiency anemia Encouraged her to try 5 mg of Lexapro daily and possibly increase to 10 mg, which she has tolerated well in the past Can increase further depending on how she is feeling

## 2021-02-12 NOTE — Assessment & Plan Note (Signed)
Chronic improved with wellbutrin Continue wellbutrin 300 mg xl daily

## 2021-02-12 NOTE — Assessment & Plan Note (Signed)
New Diagnosed by GYN Taking metformin XR 500 mg every afternoon

## 2021-02-12 NOTE — Assessment & Plan Note (Signed)
Chronic Advised by GYN to start iron supplementation, which she has not done-she did get sick with iron supplementation in the past Possibly causing her dizziness and tinnitus Stressed starting slow release iron to see if she tolerates that-most likely she will feel better with higher iron levels and improving her anemia She does not tolerate oral iron supplementation may need iron infusion

## 2021-03-06 ENCOUNTER — Emergency Department (HOSPITAL_BASED_OUTPATIENT_CLINIC_OR_DEPARTMENT_OTHER): Payer: 59

## 2021-03-06 ENCOUNTER — Emergency Department (HOSPITAL_BASED_OUTPATIENT_CLINIC_OR_DEPARTMENT_OTHER)
Admission: EM | Admit: 2021-03-06 | Discharge: 2021-03-06 | Disposition: A | Discharge status: Home / Self Care | Payer: 59 | Attending: Emergency Medicine | Admitting: Emergency Medicine

## 2021-03-06 ENCOUNTER — Encounter (HOSPITAL_BASED_OUTPATIENT_CLINIC_OR_DEPARTMENT_OTHER): Payer: Self-pay | Admitting: Emergency Medicine

## 2021-03-06 ENCOUNTER — Other Ambulatory Visit: Payer: Self-pay

## 2021-03-06 DIAGNOSIS — R42 Dizziness and giddiness: Secondary | ICD-10-CM | POA: Insufficient documentation

## 2021-03-06 DIAGNOSIS — H9202 Otalgia, left ear: Secondary | ICD-10-CM | POA: Insufficient documentation

## 2021-03-06 DIAGNOSIS — R0981 Nasal congestion: Secondary | ICD-10-CM | POA: Insufficient documentation

## 2021-03-06 DIAGNOSIS — J45909 Unspecified asthma, uncomplicated: Secondary | ICD-10-CM | POA: Diagnosis not present

## 2021-03-06 DIAGNOSIS — J029 Acute pharyngitis, unspecified: Secondary | ICD-10-CM | POA: Insufficient documentation

## 2021-03-06 DIAGNOSIS — J069 Acute upper respiratory infection, unspecified: Secondary | ICD-10-CM

## 2021-03-06 DIAGNOSIS — Z7951 Long term (current) use of inhaled steroids: Secondary | ICD-10-CM | POA: Diagnosis not present

## 2021-03-06 DIAGNOSIS — Z20822 Contact with and (suspected) exposure to covid-19: Secondary | ICD-10-CM | POA: Diagnosis not present

## 2021-03-06 HISTORY — DX: Polycystic ovarian syndrome: E28.2

## 2021-03-06 LAB — RESP PANEL BY RT-PCR (FLU A&B, COVID) ARPGX2
Influenza A by PCR: NEGATIVE
Influenza B by PCR: NEGATIVE
SARS Coronavirus 2 by RT PCR: NEGATIVE

## 2021-03-06 LAB — GROUP A STREP BY PCR: Group A Strep by PCR: NOT DETECTED

## 2021-03-06 NOTE — ED Provider Notes (Signed)
[facility] EMERGENCY DEPT Provider Note   CSN: [id number] Arrival date & time: 03/06/21  1213     History Chief Complaint  Patient presents with   Otalgia   Dizziness    Brenda [name] is a 35 y.o. female.  The patient with a complaint of left ear pain and sore throat for 2 days.  Associated with some congestion.  No fever.  Also has had intermittent dizziness for about a month.  Has an appointment with her primary care doctor tomorrow for this complaint.  Denies any motor weakness or numbness or visual problems or speech problems.  Patient had a negative home COVID test yesterday.  Patient has a history of strep throat frequently.  Past medical history is significant for polycystic ovarian syndrome.      Past Medical History:  Diagnosis Date   Abnormal Pap smear 2007   colpo   Asthma    childhood - PRN inhaler   Fibroid    4 fibroids    Gestational diabetes    first pregnancy, diet controlled   H/O chlamydia infection 2010   H/O varicella    Mass of neck 03/02/2014   Obese    PCOS (polycystic ovarian syndrome)    Rubella non-immune status 01/11/2012   Vaginal Pap smear, abnormal    biopsy - normal     Patient Active Problem List   Diagnosis Date Noted   PCOS (polycystic ovarian syndrome) 02/12/2021   Iron deficiency anemia 02/12/2021   Dysthymia 02/12/2021   Cellulitis of right lower extremity 01/29/2021   Shingles 01/29/2021   Fatigue 12/16/2020   Anemia 12/16/2020   Impaired attention 12/16/2020   Anxiety 11/04/2020   Acute midline low back pain with bilateral sciatica 09/18/2020   Status post repeat low transverse cesarean section 09/29/2018   Maternal obesity affecting pregnancy, antepartum 03/24/2018   Allergic rhinitis 02/01/2017   Genital herpes simplex 02/01/2017   Prediabetes 11/24/2016   Vitamin D deficiency 11/24/2016   S/P primary low transverse C-section 02/28/2016   Obesity with body mass index 30 or greater 08/31/2015    Seasonal asthma 08/06/2011   Morbid obesity ([place]) 08/06/2011   Fibroids 08/06/2011    Past Surgical History:  Procedure Laterality Date   CESAREAN SECTION N/A 02/28/2016   Procedure: CESAREAN SECTION;  Surgeon: [name], MD;  Location: [facility];  Service: Obstetrics;  Laterality: N/A;   CESAREAN SECTION WITH BILATERAL TUBAL LIGATION Bilateral 09/29/2018   Procedure: REPEAT CESAREAN SECTION WITH BILATERAL TUBAL LIGATION;  Surgeon: [name], MD;  Location: [facility];  Service: Obstetrics;  Laterality: Bilateral;     OB History     Gravida  3   Para  3   Term  3   Preterm  0   AB  0   Living  3      SAB  0   IAB  0   Ectopic  0   Multiple  0   Live Births  3           Family History  Problem Relation Age of Onset   Hypertension Mother        diet controlled no meds   Hypertension Father    Alcohol abuse Father    Heart disease Maternal Aunt    Mental illness Maternal Aunt        bipolar   Diabetes Maternal Aunt    Heart disease Maternal Grandmother    Hypertension Maternal Grandmother    Thrombophlebitis  Maternal Grandmother    Diabetes Maternal Grandmother    Stroke Maternal Grandmother    Hypertension Paternal Grandmother    Heart disease Paternal Grandmother    Asthma Cousin    Cancer Cousin    Heart disease Cousin        congenital heart disease   Learning disabilities Cousin    Anesthesia problems Neg Hx     Social History   Tobacco Use   Smoking status: Never   Smokeless tobacco: Never  Vaping Use   Vaping Use: Never used  Substance Use Topics   Alcohol use: Yes    Comment: occasional   Drug use: No    Home Medications Prior to Admission medications   Medication Sig Start Date End Date Taking? Authorizing Provider  metFORMIN (GLUCOPHAGE-XR) 500 MG 24 hr tablet metformin ER 500 mg tablet,extended release 24 hr  TAKE 1 TABLET BY MOUTH EVERY DAY   Yes [provider]  montelukast (SINGULAIR)  10 MG tablet TAKE 1 TABLET BY MOUTH EVERYDAY AT BEDTIME 05/20/20  Yes Burns, [name], MD  pantoprazole (PROTONIX) 40 MG tablet TAKE 1 TABLET BY MOUTH EVERY DAY 05/20/20  Yes Burns, [name], MD  albuterol (VENTOLIN HFA) 108 (90 Base) MCG/ACT inhaler Inhale 1-2 puffs into the lungs every 6 (six) hours as needed for wheezing or shortness of breath. 11/22/19   Binnie Rail, MD  buPROPion (WELLBUTRIN XL) 150 MG 24 hr tablet Take 1 daily in morning for one week, then increase to 2 tabs daily in morning 12/16/20   Burns, [name], MD  escitalopram (LEXAPRO) 20 MG tablet Take 1 tablet (20 mg total) by mouth daily. 02/12/21   Binnie Rail, MD  fluticasone (FLONASE) 50 MCG/ACT nasal spray Place 2 sprays into both nostrils daily. 11/22/19   Binnie Rail, MD  loratadine (CLARITIN) 10 MG tablet Claritin    [provider]  methocarbamol (ROBAXIN) 500 MG tablet Take 1 tablet (500 mg total) by mouth every 6 (six) hours as needed for muscle spasms. 09/18/20   Binnie Rail, MD  Olopatadine HCl 0.2 % SOLN Apply 1 drop to eye in the morning and at bedtime. 11/22/19   Binnie Rail, MD  tranexamic acid (LYSTEDA) 650 MG TABS tablet Take 1,300 mg by mouth every 8 (eight) hours as needed. 10/10/20   [provider]    Allergies    Lexapro [escitalopram] and Sertraline  Review of Systems   Review of Systems  Constitutional:  Negative for chills and fever.  HENT:  Positive for congestion, ear pain and sore throat.   Eyes:  Negative for pain and visual disturbance.  Respiratory:  Negative for cough and shortness of breath.   Cardiovascular:  Negative for chest pain and palpitations.  Gastrointestinal:  Negative for abdominal pain and vomiting.  Genitourinary:  Negative for dysuria and hematuria.  Musculoskeletal:  Negative for arthralgias and back pain.  Skin:  Negative for color change and rash.  Neurological:  Positive for dizziness. Negative for seizures and syncope.  All other systems reviewed  and are negative.  Physical Exam Updated Vital Signs BP (!) 150/101 (BP Location: Right Wrist)   Pulse 86   Temp 98.4 F (36.9 C)   Resp 16   LMP 01/29/2021 (Exact Date)   SpO2 100%   Physical Exam Vitals and nursing note reviewed.  Constitutional:      General: She is not in acute distress.    Appearance: She is well-developed. She is  obese. She is not toxic-appearing.  HENT:     Head: Normocephalic and atraumatic.     Right Ear: Tympanic membrane, ear canal and external ear normal.     Left Ear: Tympanic membrane, ear canal and external ear normal.     Mouth/Throat:     Mouth: Mucous membranes are moist.     Pharynx: Posterior oropharyngeal erythema present. No oropharyngeal exudate.     Comments: Uvula is midline.  Bilateral tonsillar enlargement no exudate.  There is some erythema. Eyes:     Extraocular Movements: Extraocular movements intact.     Conjunctiva/sclera: Conjunctivae normal.     Pupils: Pupils are equal, round, and reactive to light.  Cardiovascular:     Rate and Rhythm: Normal rate and regular rhythm.     Heart sounds: No murmur heard. Pulmonary:     Effort: Pulmonary effort is normal. No respiratory distress.     Breath sounds: Normal breath sounds.  Abdominal:     Palpations: Abdomen is soft.     Tenderness: There is no abdominal tenderness.  Musculoskeletal:     Cervical back: Neck supple.  Skin:    General: Skin is warm and dry.  Neurological:     General: No focal deficit present.     Mental Status: She is alert and oriented to person, place, and time.     Cranial Nerves: No cranial nerve deficit.     Sensory: No sensory deficit.     Motor: No weakness.    ED Results / Procedures / Treatments   Labs (all labs ordered are listed, but only abnormal results are displayed) Labs Reviewed  RESP PANEL BY RT-PCR (FLU A&B, COVID) ARPGX2  GROUP A STREP BY PCR    EKG None  Radiology No results found.  Procedures Procedures   Medications  Ordered in ED Medications - No data to display  ED Course  I have reviewed the triage vital signs and the nursing notes.  Pertinent labs & imaging results that were available during my care of the patient were reviewed by me and considered in my medical decision making (see chart for details).    MDM Rules/Calculators/A&P                           COVID test is negative flu test negative.  We will go ahead and check for strep.  Since no evidence of any clinical ear infection.  So therefore no indication at this point in time to put her on antibiotics.  Since she has follow-up with her primary tomorrow for the dizziness would CT her head while waiting for the rapid strep test to come back.  This will help her primary care evaluate the dizziness.  Patient nontoxic no acute distress.  Rapid strep test negative.  And head CT without any acute findings.  Patient can be treated symptomatically.   Final Clinical Impression(s) / ED Diagnoses Final diagnoses:  None    Rx / DC Orders ED Discharge Orders     None        Fredia Sorrow, MD 03/06/21 1758

## 2021-03-06 NOTE — Discharge Instructions (Addendum)
Work-up today negative COVID influenza test.  Rapid strep negative.  CT head without any acute findings.  Symptoms seem to be consistent with a viral upper respiratory infection.  Can take over-the-counter medications as needed.  Keep your appointment to follow-up with your primary care doctor for tomorrow.

## 2021-03-06 NOTE — ED Triage Notes (Signed)
Pt via pov from home with left ear pain and sore throat x 2 days and dizziness intermittently x 1 month. Pt states she has a virtual appointment tomorrow regarding the dizziness, but she doesn't feel well today. Pt had negative covid home test yesterday. Pt alert & oriented, nad noted.

## 2021-03-07 ENCOUNTER — Ambulatory Visit: Payer: 59 | Admitting: Internal Medicine

## 2021-03-13 NOTE — Progress Notes (Signed)
Subjective:    Patient ID: Brenda [name], female    DOB: 12/11/1985, 35 y.o.   MRN: [id number]   This visit occurred during the SARS-CoV-2 public health emergency.  Safety protocols were in place, including screening questions prior to the visit, additional usage of staff PPE, and extensive cleaning of exam room while observing appropriate contact time as indicated for disinfecting solutions.    HPI She is here for a physical exam.   Lexapro 10 ?  Medications and allergies reviewed with patient and updated if appropriate.  Patient Active Problem List   Diagnosis Date Noted  . Dizziness 06/05/2021  . Urinary frequency 06/05/2021  . PCOS (polycystic ovarian syndrome) 02/12/2021  . Iron deficiency anemia 02/12/2021  . Dysthymia 02/12/2021  . Cellulitis of right lower extremity 01/29/2021  . Shingles 01/29/2021  . Fatigue 12/16/2020  . Impaired attention 12/16/2020  . Anxiety 11/04/2020  . Acute midline low back pain with bilateral sciatica 09/18/2020  . Allergic rhinitis 02/01/2017  . Genital herpes simplex 02/01/2017  . Prediabetes 11/24/2016  . Vitamin D deficiency 11/24/2016  . S/P primary low transverse C-section 02/28/2016  . Obesity with body mass index 30 or greater 08/31/2015  . Seasonal asthma 08/06/2011  . Morbid obesity ([place]) 08/06/2011  . Fibroids 08/06/2011    Current Outpatient Medications on File Prior to Visit  Medication Sig Dispense Refill  . buPROPion (WELLBUTRIN XL) 150 MG 24 hr tablet Take 1 daily in morning for one week, then increase to 2 tabs daily in morning 60 tablet 3  . fluticasone (FLONASE) 50 MCG/ACT nasal spray Place 2 sprays into both nostrils daily. 16 g 6  . loratadine (CLARITIN) 10 MG tablet Claritin    . methocarbamol (ROBAXIN) 500 MG tablet Take 1 tablet (500 mg total) by mouth every 6 (six) hours as needed for muscle spasms. 30 tablet 0  . montelukast (SINGULAIR) 10 MG tablet TAKE 1 TABLET BY MOUTH EVERYDAY AT BEDTIME 90 tablet  1  . Olopatadine HCl 0.2 % SOLN Apply 1 drop to eye in the morning and at bedtime. 2.5 mL 5  . tranexamic acid (LYSTEDA) 650 MG TABS tablet Take 1,300 mg by mouth every 8 (eight) hours as needed.     No current facility-administered medications on file prior to visit.    Past Medical History:  Diagnosis Date  . Abnormal Pap smear 2007   colpo  . Asthma    childhood - PRN inhaler  . Fibroid    4 fibroids   . Gestational diabetes    first pregnancy, diet controlled  . H/O chlamydia infection 2010  . H/O varicella   . Mass of neck 03/02/2014  . Obese   . PCOS (polycystic ovarian syndrome)   . Rubella non-immune status 01/11/2012  . Vaginal Pap smear, abnormal    biopsy - normal     Past Surgical History:  Procedure Laterality Date  . CESAREAN SECTION N/A 02/28/2016   Procedure: CESAREAN SECTION;  Surgeon: [name], MD;  Location: [facility];  Service: Obstetrics;  Laterality: N/A;  . CESAREAN SECTION WITH BILATERAL TUBAL LIGATION Bilateral 09/29/2018   Procedure: REPEAT CESAREAN SECTION WITH BILATERAL TUBAL LIGATION;  Surgeon: [name], MD;  Location: [facility];  Service: Obstetrics;  Laterality: Bilateral;    Social History   Socioeconomic History  . Marital status: Single    Spouse name: Not on file  . Number of children: Not on file  . Years of education: Not on  file  . Highest education level: Not on file  Occupational History  . Not on file  Tobacco Use  . Smoking status: Never  . Smokeless tobacco: Never  Vaping Use  . Vaping Use: Never used  Substance and Sexual Activity  . Alcohol use: Yes    Comment: occasional  . Drug use: No  . Sexual activity: Yes    Birth control/protection: None    Comment: sept 2017 - removed   Other Topics Concern  . Not on file  Social History Narrative   Engaged, 50 year old daughter   Social Determinants of Health   Financial Resource Strain: Not on file  Food Insecurity: Not on file   Transportation Needs: Not on file  Physical Activity: Insufficiently Active  . Days of Exercise per Week: 1 day  . Minutes of Exercise per Session: 30 min  Stress: Stress Concern Present  . Feeling of Stress : Rather much  Social Connections: Unknown  . Frequency of Communication with Friends and Family: Not on file  . Frequency of Social Gatherings with Friends and Family: Not on file  . Attends Religious Services: 1 to 4 times per year  . Active Member of Clubs or Organizations: No  . Attends Archivist Meetings: Not on file  . Marital Status: Never married    Family History  Problem Relation Age of Onset  . Hypertension Mother        diet controlled no meds  . Hypertension Father   . Alcohol abuse Father   . Heart disease Maternal Aunt   . Mental illness Maternal Aunt        bipolar  . Diabetes Maternal Aunt   . Heart disease Maternal Grandmother   . Hypertension Maternal Grandmother   . Thrombophlebitis Maternal Grandmother   . Diabetes Maternal Grandmother   . Stroke Maternal Grandmother   . Hypertension Paternal Grandmother   . Heart disease Paternal Grandmother   . Asthma Cousin   . Cancer Cousin   . Heart disease Cousin        congenital heart disease  . Learning disabilities Cousin   . Anesthesia problems Neg Hx     Review of Systems     Objective:  There were no vitals filed for this visit. There were no vitals filed for this visit. There is no height or weight on file to calculate BMI.  BP Readings from Last 3 Encounters:  06/05/21 120/84  03/23/21 (!) 88/55  03/06/21 119/77    Wt Readings from Last 3 Encounters:  06/05/21 254 lb 3.2 oz (115.3 kg)  03/23/21 245 lb (111.1 kg)  11/04/20 256 lb (116.1 kg)     Physical Exam Constitutional: She appears well-developed and well-nourished. No distress.  HENT:  Head: Normocephalic and atraumatic.  Right Ear: External ear normal. Normal ear canal and TM Left Ear: External ear normal.   Normal ear canal and TM Mouth/Throat: Oropharynx is clear and moist.  Eyes: Conjunctivae and EOM are normal.  Neck: Neck supple. No tracheal deviation present. No thyromegaly present.  No carotid bruit  Cardiovascular: Normal rate, regular rhythm and normal heart sounds.   No murmur heard.  No edema. Pulmonary/Chest: Effort normal and breath sounds normal. No respiratory distress. She has no wheezes. She has no rales.  Breast: deferred   Abdominal: Soft. She exhibits no distension. There is no tenderness.  Lymphadenopathy: She has no cervical adenopathy.  Skin: Skin is warm and dry. She is not diaphoretic.  Psychiatric: She has a normal mood and affect. Her behavior is normal.     Lab Results  Component Value Date   WBC 3.5 (L) 06/05/2021   HGB 7.6 Repeated and verified X2. (LL) 06/05/2021   HCT 25.9 (L) 06/05/2021   PLT 310.0 06/05/2021   GLUCOSE 81 06/05/2021   CHOL 131 02/01/2017   TRIG 62.0 02/01/2017   HDL 36.00 (L) 02/01/2017   LDLCALC 82 02/01/2017   ALT 15 06/05/2021   AST 14 06/05/2021   NA 138 06/05/2021   K 4.0 06/05/2021   CL 105 06/05/2021   CREATININE 0.90 06/05/2021   BUN 13 06/05/2021   CO2 26 06/05/2021   TSH 0.59 02/01/2017   HGBA1C 6.0 02/01/2017         Assessment & Plan:   Physical exam: Screening blood work  ordered Exercise   Weight  advised weight loss Substance abuse  none   Reviewed recommended immunizations.   Health Maintenance  Topic Date Due  . Pneumococcal Vaccine 73-42 Years old (1 - PCV) Never done  . COVID-19 Vaccine (2 - Pfizer series) 03/26/2020  . PAP SMEAR-Modifier  04/17/2023  . TETANUS/TDAP  07/25/2028  . INFLUENZA VACCINE  Completed  . Hepatitis C Screening  Completed  . HIV Screening  Completed  . HPV VACCINES  Aged Out          See Problem List for Assessment and Plan of chronic medical problems.      This encounter was created in error - please disregard.

## 2021-03-13 NOTE — Patient Instructions (Signed)
Blood work was ordered.     Medications changes include :     Your prescription(s) have been submitted to your pharmacy. Please take as directed and contact our office if you believe you are having problem(s) with the medication(s).   A referral was ordered for        Someone from their office will call you to schedule an appointment.    Please followup in 6 months    Health Maintenance, Female Adopting a healthy lifestyle and getting preventive care are important in promoting health and wellness. Ask your health care provider about: The right schedule for you to have regular tests and exams. Things you can do on your own to prevent diseases and keep yourself healthy. What should I know about diet, weight, and exercise? Eat a healthy diet  Eat a diet that includes plenty of vegetables, fruits, low-fat dairy products, and lean protein. Do not eat a lot of foods that are high in solid fats, added sugars, or sodium.  Maintain a healthy weight Body mass index (BMI) is used to identify weight problems. It estimates body fat based on height and weight. Your health care provider can help determineyour BMI and help you achieve or maintain a healthy weight. Get regular exercise Get regular exercise. This is one of the most important things you can do for your health. Most adults should: Exercise for at least 150 minutes each week. The exercise should increase your heart rate and make you sweat (moderate-intensity exercise). Do strengthening exercises at least twice a week. This is in addition to the moderate-intensity exercise. Spend less time sitting. Even light physical activity can be beneficial. Watch cholesterol and blood lipids Have your blood tested for lipids and cholesterol at 33 years of age, then havethis test every 5 years. Have your cholesterol levels checked more often if: Your lipid or cholesterol levels are high. You are older than 33 years of age. You are at high risk  for heart disease. What should I know about cancer screening? Depending on your health history and family history, you may need to have cancer screening at various ages. This may include screening for: Breast cancer. Cervical cancer. Colorectal cancer. Skin cancer. Lung cancer. What should I know about heart disease, diabetes, and high blood pressure? Blood pressure and heart disease High blood pressure causes heart disease and increases the risk of stroke. This is more likely to develop in people who have high blood pressure readings, are of African descent, or are overweight. Have your blood pressure checked: Every 3-5 years if you are 34-75 years of age. Every year if you are 96 years old or older. Diabetes Have regular diabetes screenings. This checks your fasting blood sugar level. Have the screening done: Once every three years after age 84 if you are at a normal weight and have a low risk for diabetes. More often and at a younger age if you are overweight or have a high risk for diabetes. What should I know about preventing infection? Hepatitis B If you have a higher risk for hepatitis B, you should be screened for this virus. Talk with your health care provider to find out if you are at risk forhepatitis B infection. Hepatitis C Testing is recommended for: Everyone born from [birth year] through 1963. Anyone with known risk factors for hepatitis C. Sexually transmitted infections (STIs) Get screened for STIs, including gonorrhea and chlamydia, if: You are sexually active and are younger than 33 years of age. You are  older than 33 years of age and your health care provider tells you that you are at risk for this type of infection. Your sexual activity has changed since you were last screened, and you are at increased risk for chlamydia or gonorrhea. Ask your health care provider if you are at risk. Ask your health care provider about whether you are at high risk for HIV. Your health care  provider may recommend a prescription medicine to help prevent HIV infection. If you choose to take medicine to prevent HIV, you should first get tested for HIV. You should then be tested every 3 months for as long as you are taking the medicine. Pregnancy If you are about to stop having your period (premenopausal) and you may become pregnant, seek counseling before you get pregnant. Take 400 to 800 micrograms (mcg) of folic acid every day if you become pregnant. Ask for birth control (contraception) if you want to prevent pregnancy. Osteoporosis and menopause Osteoporosis is a disease in which the bones lose minerals and strength with aging. This can result in bone fractures. If you are 20 years old or older, or if you are at risk for osteoporosis and fractures, ask your health care provider if you should: Be screened for bone loss. Take a calcium or vitamin D supplement to lower your risk of fractures. Be given hormone replacement therapy (HRT) to treat symptoms of menopause. Follow these instructions at home: Lifestyle Do not use any products that contain nicotine or tobacco, such as cigarettes, e-cigarettes, and chewing tobacco. If you need help quitting, ask your health care provider. Do not use street drugs. Do not share needles. Ask your health care provider for help if you need support or information about quitting drugs. Alcohol use Do not drink alcohol if: Your health care provider tells you not to drink. You are pregnant, may be pregnant, or are planning to become pregnant. If you drink alcohol: Limit how much you use to 0-1 drink a day. Limit intake if you are breastfeeding. Be aware of how much alcohol is in your drink. In the [place] one drink equals one 12 oz bottle of beer (355 mL), one 5 oz glass of wine (148 mL), or one 1 oz glass of hard liquor (44 mL). General instructions Schedule regular health, dental, and eye exams. Stay current with your vaccines. Tell your health  care provider if: You often feel depressed. You have ever been abused or do not feel safe at home. Summary Adopting a healthy lifestyle and getting preventive care are important in promoting health and wellness. Follow your health care provider's instructions about healthy diet, exercising, and getting tested or screened for diseases. Follow your health care provider's instructions on monitoring your cholesterol and blood pressure. This information is not intended to replace advice given to you by your health care provider. Make sure you discuss any questions you have with your healthcare provider. Document Revised: 07/20/2018 Document Reviewed: 07/20/2018 Elsevier Patient Education  2022 [employer].

## 2021-03-14 ENCOUNTER — Encounter: Payer: 59 | Admitting: Internal Medicine

## 2021-03-14 DIAGNOSIS — F419 Anxiety disorder, unspecified: Secondary | ICD-10-CM

## 2021-03-14 DIAGNOSIS — R41840 Attention and concentration deficit: Secondary | ICD-10-CM

## 2021-03-14 DIAGNOSIS — Z0000 Encounter for general adult medical examination without abnormal findings: Secondary | ICD-10-CM

## 2021-03-14 DIAGNOSIS — E282 Polycystic ovarian syndrome: Secondary | ICD-10-CM

## 2021-03-14 DIAGNOSIS — E559 Vitamin D deficiency, unspecified: Secondary | ICD-10-CM

## 2021-03-14 DIAGNOSIS — R7303 Prediabetes: Secondary | ICD-10-CM

## 2021-03-14 DIAGNOSIS — D500 Iron deficiency anemia secondary to blood loss (chronic): Secondary | ICD-10-CM

## 2021-03-23 ENCOUNTER — Encounter (HOSPITAL_BASED_OUTPATIENT_CLINIC_OR_DEPARTMENT_OTHER): Payer: Self-pay

## 2021-03-23 ENCOUNTER — Emergency Department (HOSPITAL_BASED_OUTPATIENT_CLINIC_OR_DEPARTMENT_OTHER)
Admission: EM | Admit: 2021-03-23 | Discharge: 2021-03-23 | Disposition: A | Discharge status: Home / Self Care | Payer: 59 | Attending: Emergency Medicine | Admitting: Emergency Medicine

## 2021-03-23 ENCOUNTER — Other Ambulatory Visit: Payer: Self-pay

## 2021-03-23 ENCOUNTER — Emergency Department (HOSPITAL_BASED_OUTPATIENT_CLINIC_OR_DEPARTMENT_OTHER): Payer: 59 | Admitting: Radiology

## 2021-03-23 DIAGNOSIS — D500 Iron deficiency anemia secondary to blood loss (chronic): Secondary | ICD-10-CM | POA: Diagnosis not present

## 2021-03-23 DIAGNOSIS — J45909 Unspecified asthma, uncomplicated: Secondary | ICD-10-CM | POA: Diagnosis not present

## 2021-03-23 DIAGNOSIS — M542 Cervicalgia: Secondary | ICD-10-CM | POA: Diagnosis not present

## 2021-03-23 DIAGNOSIS — R079 Chest pain, unspecified: Secondary | ICD-10-CM

## 2021-03-23 DIAGNOSIS — N939 Abnormal uterine and vaginal bleeding, unspecified: Secondary | ICD-10-CM | POA: Diagnosis not present

## 2021-03-23 DIAGNOSIS — Z7952 Long term (current) use of systemic steroids: Secondary | ICD-10-CM | POA: Diagnosis not present

## 2021-03-23 LAB — CBC
HCT: 30.8 % — ABNORMAL LOW (ref 36.0–46.0)
Hemoglobin: 9.3 g/dL — ABNORMAL LOW (ref 12.0–15.0)
MCH: 23.1 pg — ABNORMAL LOW (ref 26.0–34.0)
MCHC: 30.2 g/dL (ref 30.0–36.0)
MCV: 76.6 fL — ABNORMAL LOW (ref 80.0–100.0)
Platelets: 300 10*3/uL (ref 150–400)
RBC: 4.02 MIL/uL (ref 3.87–5.11)
RDW: 21.6 % — ABNORMAL HIGH (ref 11.5–15.5)
WBC: 4.5 10*3/uL (ref 4.0–10.5)
nRBC: 0 % (ref 0.0–0.2)

## 2021-03-23 LAB — BASIC METABOLIC PANEL
Anion gap: 7 (ref 5–15)
BUN: 16 mg/dL (ref 6–20)
CO2: 27 mmol/L (ref 22–32)
Calcium: 8.6 mg/dL — ABNORMAL LOW (ref 8.9–10.3)
Chloride: 104 mmol/L (ref 98–111)
Creatinine, Ser: 0.85 mg/dL (ref 0.44–1.00)
GFR, Estimated: >60 mL/min (ref >60)
Glucose, Bld: 96 mg/dL (ref 70–99)
Potassium: 3.5 mmol/L (ref 3.5–5.1)
Sodium: 138 mmol/L (ref 135–145)

## 2021-03-23 LAB — TROPONIN I (HIGH SENSITIVITY)
Troponin I (High Sensitivity): 2 ng/L (ref <18)
Troponin I (High Sensitivity): 2 ng/L (ref <18)

## 2021-03-23 MED ORDER — KETOROLAC TROMETHAMINE 15 MG/ML IJ SOLN
15.0000 mg | Freq: Once | INTRAMUSCULAR | Status: AC
  Administered 2021-03-23: 15 mg via INTRAVENOUS
  Filled 2021-03-23: qty 1

## 2021-03-23 NOTE — ED Provider Notes (Signed)
[facility] EMERG[facility] DEPT Provider Note  CSN: [id number] Arrival date & time: 03/23/21 0112  Chief Complaint(s) Chest Pain  HPI Brenda [name] is a 35 y.o. female   The history is provided by the patient.  Chest Pain Pain location:  L chest Pain quality: dull   Pain radiates to:  Does not radiate Pain severity:  Mild Onset quality:  Gradual Duration:  6 days Timing:  Constant Progression:  Waxing and waning Chronicity:  New Relieved by: being upright. Worsened by:  Certain positions (lying down) Associated symptoms: dizziness, fatigue, palpitations and shortness of breath   Associated symptoms: no cough, no fever, no lower extremity edema, no nausea and no vomiting    Has a h/o fibroids and has had heavy menstruation for over 1 month.   Past Medical History Past Medical History:  Diagnosis Date   Abnormal Pap smear 2007   colpo   Asthma    childhood - PRN inhaler   Fibroid    4 fibroids    Gestational diabetes    first pregnancy, diet controlled   H/O chlamydia infection 2010   H/O varicella    Mass of neck 03/02/2014   Obese    PCOS (polycystic ovarian syndrome)    Rubella non-immune status 01/11/2012   Vaginal Pap smear, abnormal    biopsy - normal    Patient Active Problem List   Diagnosis Date Noted   PCOS (polycystic ovarian syndrome) 02/12/2021   Iron deficiency anemia 02/12/2021   Dysthymia 02/12/2021   Cellulitis of right lower extremity 01/29/2021   Shingles 01/29/2021   Fatigue 12/16/2020   Impaired attention 12/16/2020   Anxiety 11/04/2020   Acute midline low back pain with bilateral sciatica 09/18/2020   Allergic rhinitis 02/01/2017   Genital herpes simplex 02/01/2017   Prediabetes 11/24/2016   Vitamin D deficiency 11/24/2016   S/P primary low transverse C-section 02/28/2016   Obesity with body mass index 30 or greater 08/31/2015   Seasonal asthma 08/06/2011   Morbid obesity ([place]) 08/06/2011   Fibroids 08/06/2011    Home Medication(s) Prior to Admission medications   Medication Sig Start Date End Date Taking? Authorizing Provider  albuterol (VENTOLIN HFA) 108 (90 Base) MCG/ACT inhaler Inhale 1-2 puffs into the lungs every 6 (six) hours as needed for wheezing or shortness of breath. 11/22/19   Binnie Rail, MD  buPROPion (WELLBUTRIN XL) 150 MG 24 hr tablet Take 1 daily in morning for one week, then increase to 2 tabs daily in morning 12/16/20   Burns, [name], MD  escitalopram (LEXAPRO) 20 MG tablet Take 1 tablet (20 mg total) by mouth daily. 02/12/21   Binnie Rail, MD  fluticasone (FLONASE) 50 MCG/ACT nasal spray Place 2 sprays into both nostrils daily. 11/22/19   Binnie Rail, MD  loratadine (CLARITIN) 10 MG tablet Claritin    [provider]  metFORMIN (GLUCOPHAGE-XR) 500 MG 24 hr tablet metformin ER 500 mg tablet,extended release 24 hr  TAKE 1 TABLET BY MOUTH EVERY DAY    [provider]  methocarbamol (ROBAXIN) 500 MG tablet Take 1 tablet (500 mg total) by mouth every 6 (six) hours as needed for muscle spasms. 09/18/20   Burns, [name], MD  montelukast (SINGULAIR) 10 MG tablet TAKE 1 TABLET BY MOUTH EVERYDAY AT BEDTIME 05/20/20   Binnie Rail, MD  Olopatadine HCl 0.2 % SOLN Apply 1 drop to eye in the morning and at bedtime. 11/22/19   Binnie Rail, MD  pantoprazole (  PROTONIX) 40 MG tablet TAKE 1 TABLET BY MOUTH EVERY DAY 05/20/20   Binnie Rail, MD  tranexamic acid (LYSTEDA) 650 MG TABS tablet Take 1,300 mg by mouth every 8 (eight) hours as needed. 10/10/20   [provider]                                                                                                                                    Past Surgical History Past Surgical History:  Procedure Laterality Date   CESAREAN SECTION N/A 02/28/2016   Procedure: CESAREAN SECTION;  Surgeon: [name], MD;  Location: [facility];  Service: Obstetrics;  Laterality: N/A;   CESAREAN SECTION WITH BILATERAL  TUBAL LIGATION Bilateral 09/29/2018   Procedure: REPEAT CESAREAN SECTION WITH BILATERAL TUBAL LIGATION;  Surgeon: [name], MD;  Location: [facility];  Service: Obstetrics;  Laterality: Bilateral;   Family History Family History  Problem Relation Age of Onset   Hypertension Mother        diet controlled no meds   Hypertension Father    Alcohol abuse Father    Heart disease Maternal Aunt    Mental illness Maternal Aunt        bipolar   Diabetes Maternal Aunt    Heart disease Maternal Grandmother    Hypertension Maternal Grandmother    Thrombophlebitis Maternal Grandmother    Diabetes Maternal Grandmother    Stroke Maternal Grandmother    Hypertension Paternal Grandmother    Heart disease Paternal Grandmother    Asthma Cousin    Cancer Cousin    Heart disease Cousin        congenital heart disease   Learning disabilities Cousin    Anesthesia problems Neg Hx     Social History Social History   Tobacco Use   Smoking status: Never   Smokeless tobacco: Never  Vaping Use   Vaping Use: Never used  Substance Use Topics   Alcohol use: Yes    Comment: occasional   Drug use: No   Allergies Lexapro [escitalopram] and Sertraline  Review of Systems Review of Systems  Constitutional:  Positive for fatigue. Negative for fever.  Respiratory:  Positive for shortness of breath. Negative for cough.   Cardiovascular:  Positive for chest pain and palpitations.  Gastrointestinal:  Negative for nausea and vomiting.  Neurological:  Positive for dizziness.  All other systems are reviewed and are negative for acute change except as noted in the HPI  Physical Exam Vital Signs  I have reviewed the triage vital signs BP 122/82 (BP Location: Left Arm)   Pulse 84   Temp 98.1 F (36.7 C) (Oral)   Resp 12   Ht 5' 3.5" (1.613 m)   Wt 111.1 kg   SpO2 100%   BMI 42.72 kg/m   Physical Exam Vitals reviewed.  Constitutional:      General: She is not in acute distress.  Appearance: She is well-developed. She is not diaphoretic.  HENT:     Head: Normocephalic and atraumatic.     Nose: Nose normal.  Eyes:     General: No scleral icterus.       Right eye: No discharge.        Left eye: No discharge.     Conjunctiva/sclera: Conjunctivae normal.     Pupils: Pupils are equal, round, and reactive to light.  Cardiovascular:     Rate and Rhythm: Normal rate and regular rhythm.     Heart sounds: No murmur heard.   No friction rub. No gallop.  Pulmonary:     Effort: Pulmonary effort is normal. No respiratory distress.     Breath sounds: Normal breath sounds. No stridor. No rales.  Chest:     Chest wall: Tenderness present.    Abdominal:     General: There is no distension.     Palpations: Abdomen is soft.     Tenderness: There is no abdominal tenderness.  Musculoskeletal:     Cervical back: Normal range of motion and neck supple. Tenderness present.       Back:  Skin:    General: Skin is warm and dry.     Findings: No erythema or rash.  Neurological:     Mental Status: She is alert and oriented to person, place, and time.    ED Results and Treatments Labs (all labs ordered are listed, but only abnormal results are displayed) Labs Reviewed  BASIC METABOLIC PANEL - Abnormal; Notable for the following components:      Result Value   Calcium 8.6 (*)    All other components within normal limits  CBC - Abnormal; Notable for the following components:   Hemoglobin 9.3 (*)    HCT 30.8 (*)    MCV 76.6 (*)    MCH 23.1 (*)    RDW 21.6 (*)    All other components within normal limits  PREGNANCY, URINE  TROPONIN I (HIGH SENSITIVITY)  TROPONIN I (HIGH SENSITIVITY)                                                                                                                         EKG  EKG Interpretation  Date/Time:  Sunday March 23 2021 01:20:09 EDT Ventricular Rate:  84 PR Interval:  153 QRS Duration: 79 QT Interval:  366 QTC  Calculation: 433 R Axis:   63 Text Interpretation: Sinus rhythm Low voltage, precordial leads No acute changes Confirmed by Addison Lank [phone]) on 03/23/2021 1:28:23 AM       Radiology DG Chest 1 View  Result Date: 03/23/2021 CLINICAL DATA:  Sided chest pain for several days, initial encounter EXAM: CHEST  1 VIEW COMPARISON:  None. FINDINGS: The heart size and mediastinal contours are within normal limits. Both lungs are clear. The visualized skeletal structures are unremarkable. IMPRESSION: No active disease. Electronically Signed   By: Inez Catalina M.D.   On: 03/23/2021 01:54  Pertinent labs & imaging results that were available during my care of the patient were reviewed by me and considered in my medical decision making (see MDM for details).  Medications Ordered in ED Medications  ketorolac (TORADOL) 15 MG/ML injection 15 mg (15 mg Intravenous Given 03/23/21 0151)                                                                                                                                     Procedures Procedures  (including critical care time)  Medical Decision Making / ED Course I have reviewed the nursing notes for this encounter and the patient's prior records (if available in EHR or on provided paperwork).  KIRAH AREOLA was evaluated in Emergency Department on 03/23/2021 for the symptoms described in the history of present illness. She was evaluated in the context of the global COVID-19 pandemic, which necessitated consideration that the patient might be at risk for infection with the SARS-CoV-2 virus that causes COVID-19. Institutional protocols and algorithms that pertain to the evaluation of patients at risk for COVID-19 are in a state of rapid change based on information released by regulatory bodies including the CDC and federal and state organizations. These policies and algorithms were followed during the patient's care in the ED.     Atypical chest  pain. Possible MSK, anemia related chest pain.  Will rule out ACS. Low suspicion for pulmonary embolism.  Presentation not classic for aortic dissection or esophageal perforation.  Pertinent labs & imaging results that were available during my care of the patient were reviewed by me and considered in my medical decision making:  EKG without acute ischemic changes or evidence of pericarditis. Heart score less than 3.  Initial troponin negative.  Delta Trop negative.  CBC without leukocytosis.  Hemoglobin of 9.3.  Patient was able to follow-up her labs from her OB from June.  There is a 1 g drop in that timeframe and her hemoglobin.  No significant electrolyte derangements or renal sufficiency.  Chest x-ray without evidence suggestive of pneumonia, pneumothorax, pneumomediastinum.  No abnormal contour of the mediastinum to suggest dissection. No evidence of acute injuries.  Pain resolved with toradol.     Final Clinical Impression(s) / ED Diagnoses Final diagnoses:  Left-sided chest pain  Abnormal uterine bleeding  Iron deficiency anemia due to chronic blood loss    The patient appears reasonably screened and/or stabilized for discharge and I doubt any other medical condition or other [facility] requiring further screening, evaluation, or treatment in the ED at this time prior to discharge. Safe for discharge with strict return precautions.  Disposition: Discharge  Condition: Good  I have discussed the results, Dx and Tx plan with the patient/family who expressed understanding and agree(s) with the plan. Discharge instructions discussed at length. The patient/family was given strict return precautions who verbalized understanding of the instructions. No further questions at time of discharge.  ED Discharge Orders     None        Follow Up: Binnie Rail, MD [place] [address] [phone]  Call  as needed  Gynecology  Call  as needed   This  chart was dictated using voice recognition software.  Despite best efforts to proofread,  errors can occur which can change the documentation meaning.    Fatima Blank, MD 03/23/21 [phone]

## 2021-03-23 NOTE — Progress Notes (Deleted)
Subjective:    Patient ID: Brenda [name], female    DOB: 1985/11/12, 35 y.o.   MRN: [id number]  HPI The patient is here for an acute visit.   Anemia, feeling dizzy, palps, SOB - covid test negative.    Went to ED yesterday for left sided chest pain.  She was found to be anemic.        Medications and allergies reviewed with patient and updated if appropriate.  Patient Active Problem List   Diagnosis Date Noted   PCOS (polycystic ovarian syndrome) 02/12/2021   Iron deficiency anemia 02/12/2021   Dysthymia 02/12/2021   Cellulitis of right lower extremity 01/29/2021   Shingles 01/29/2021   Fatigue 12/16/2020   Impaired attention 12/16/2020   Anxiety 11/04/2020   Acute midline low back pain with bilateral sciatica 09/18/2020   Allergic rhinitis 02/01/2017   Genital herpes simplex 02/01/2017   Prediabetes 11/24/2016   Vitamin D deficiency 11/24/2016   S/P primary low transverse C-section 02/28/2016   Obesity with body mass index 30 or greater 08/31/2015   Seasonal asthma 08/06/2011   Morbid obesity ([place]) 08/06/2011   Fibroids 08/06/2011    Current Outpatient Medications on File Prior to Visit  Medication Sig Dispense Refill   albuterol (VENTOLIN HFA) 108 (90 Base) MCG/ACT inhaler Inhale 1-2 puffs into the lungs every 6 (six) hours as needed for wheezing or shortness of breath. 6.7 g 5   buPROPion (WELLBUTRIN XL) 150 MG 24 hr tablet Take 1 daily in morning for one week, then increase to 2 tabs daily in morning 60 tablet 3   escitalopram (LEXAPRO) 20 MG tablet Take 1 tablet (20 mg total) by mouth daily. 30 tablet 5   fluticasone (FLONASE) 50 MCG/ACT nasal spray Place 2 sprays into both nostrils daily. 16 g 6   loratadine (CLARITIN) 10 MG tablet Claritin     metFORMIN (GLUCOPHAGE-XR) 500 MG 24 hr tablet metformin ER 500 mg tablet,extended release 24 hr  TAKE 1 TABLET BY MOUTH EVERY DAY     methocarbamol (ROBAXIN) 500 MG tablet Take 1 tablet (500 mg total) by mouth  every 6 (six) hours as needed for muscle spasms. 30 tablet 0   montelukast (SINGULAIR) 10 MG tablet TAKE 1 TABLET BY MOUTH EVERYDAY AT BEDTIME 90 tablet 1   Olopatadine HCl 0.2 % SOLN Apply 1 drop to eye in the morning and at bedtime. 2.5 mL 5   pantoprazole (PROTONIX) 40 MG tablet TAKE 1 TABLET BY MOUTH EVERY DAY 90 tablet 0   tranexamic acid (LYSTEDA) 650 MG TABS tablet Take 1,300 mg by mouth every 8 (eight) hours as needed.     No current facility-administered medications on file prior to visit.    Past Medical History:  Diagnosis Date   Abnormal Pap smear 2007   colpo   Asthma    childhood - PRN inhaler   Fibroid    4 fibroids    Gestational diabetes    first pregnancy, diet controlled   H/O chlamydia infection 2010   H/O varicella    Mass of neck 03/02/2014   Obese    PCOS (polycystic ovarian syndrome)    Rubella non-immune status 01/11/2012   Vaginal Pap smear, abnormal    biopsy - normal     Past Surgical History:  Procedure Laterality Date   CESAREAN SECTION N/A 02/28/2016   Procedure: CESAREAN SECTION;  Surgeon: [name], MD;  Location: [facility];  Service: Obstetrics;  Laterality: N/A;   CESAREAN  SECTION WITH BILATERAL TUBAL LIGATION Bilateral 09/29/2018   Procedure: REPEAT CESAREAN SECTION WITH BILATERAL TUBAL LIGATION;  Surgeon: [name], MD;  Location: [facility];  Service: Obstetrics;  Laterality: Bilateral;    Social History   Socioeconomic History   Marital status: Single    Spouse name: Not on file   Number of children: Not on file   Years of education: Not on file   Highest education level: Not on file  Occupational History   Not on file  Tobacco Use   Smoking status: Never   Smokeless tobacco: Never  Vaping Use   Vaping Use: Never used  Substance and Sexual Activity   Alcohol use: Yes    Comment: occasional   Drug use: No   Sexual activity: Yes    Birth control/protection: None    Comment: sept 2017 - removed    Other Topics Concern   Not on file  Social History Narrative   Engaged, 19 year old daughter   Social Determinants of Health   Financial Resource Strain: Not on file  Food Insecurity: Not on file  Transportation Needs: Not on file  Physical Activity: Insufficiently Active   Days of Exercise per Week: 1 day   Minutes of Exercise per Session: 30 min  Stress: Stress Concern Present   Feeling of Stress : Rather much  Social Connections: Unknown   Frequency of Communication with Friends and Family: Not on file   Frequency of Social Gatherings with Friends and Family: Not on file   Attends Religious Services: 1 to 4 times per year   Active Member of [employer] or Organizations: No   Attends Music therapist: Not on file   Marital Status: Never married    Family History  Problem Relation Age of Onset   Hypertension Mother        diet controlled no meds   Hypertension Father    Alcohol abuse Father    Heart disease Maternal Aunt    Mental illness Maternal Aunt        bipolar   Diabetes Maternal Aunt    Heart disease Maternal Grandmother    Hypertension Maternal Grandmother    Thrombophlebitis Maternal Grandmother    Diabetes Maternal Grandmother    Stroke Maternal Grandmother    Hypertension Paternal Grandmother    Heart disease Paternal Grandmother    Asthma Cousin    Cancer Cousin    Heart disease Cousin        congenital heart disease   Learning disabilities Cousin    Anesthesia problems Neg Hx     Review of Systems     Objective:  There were no vitals filed for this visit. BP Readings from Last 3 Encounters:  03/23/21 (!) 88/55  03/06/21 119/77  01/19/21 137/88   Wt Readings from Last 3 Encounters:  03/23/21 245 lb (111.1 kg)  11/04/20 256 lb (116.1 kg)  09/27/20 255 lb 6.4 oz (115.8 kg)   There is no height or weight on file to calculate BMI.   Physical Exam       Lab Results  Component Value Date   WBC 4.5 03/23/2021   HGB 9.3 (L)  03/23/2021   HCT 30.8 (L) 03/23/2021   PLT 300 03/23/2021   GLUCOSE 96 03/23/2021   CHOL 131 02/01/2017   TRIG 62.0 02/01/2017   HDL 36.00 (L) 02/01/2017   LDLCALC 82 02/01/2017   ALT 18 02/01/2017   AST 12 02/01/2017   NA 138 03/23/2021  K 3.5 03/23/2021   CL 104 03/23/2021   CREATININE 0.85 03/23/2021   BUN 16 03/23/2021   CO2 27 03/23/2021   TSH 0.59 02/01/2017   HGBA1C 6.0 02/01/2017      Assessment & Plan:    See Problem List for Assessment and Plan of chronic medical problems.    This visit occurred during the SARS-CoV-2 public health emergency.  Safety protocols were in place, including screening questions prior to the visit, additional usage of staff PPE, and extensive cleaning of exam room while observing appropriate contact time as indicated for disinfecting solutions.

## 2021-03-23 NOTE — ED Triage Notes (Signed)
Patient here POV from Home with CP. Pain began Monday but has not subsided since.   Pain is non-radiating and is mainly located in the Left Side. Mild to Moderate SOB.  No N/V/D. Ambulatory, GCS 15. NAD noted during Triage.

## 2021-03-24 ENCOUNTER — Ambulatory Visit: Payer: 59 | Admitting: Internal Medicine

## 2021-03-24 ENCOUNTER — Encounter: Payer: Self-pay | Admitting: Internal Medicine

## 2021-03-24 DIAGNOSIS — D500 Iron deficiency anemia secondary to blood loss (chronic): Secondary | ICD-10-CM

## 2021-03-30 ENCOUNTER — Other Ambulatory Visit: Payer: Self-pay | Admitting: Internal Medicine

## 2021-06-04 NOTE — Progress Notes (Signed)
Subjective:    Patient ID: Brenda [name], female    DOB: [birth year], 35 y.o.   MRN: [id number]  This visit occurred during the SARS-CoV-2 public health emergency.  Safety protocols were in place, including screening questions prior to the visit, additional usage of staff PPE, and extensive cleaning of exam room while observing appropriate contact time as indicated for disinfecting solutions.    HPI The patient is here for an acute visit.  Uti - she had symptoms a few days last week - her bladder felt full after emptying it, she had bladder discomfort, urgency, frequency.  She started drinking cranberry juice and her symptoms are better.   No hematuria.     Dizzy spells associated with ringing in the ear - that has been going on for a while.     Weight loss - she is eating ice all the time.  She craves it.  She states decreased appetite and is not eating as much.     Medications and allergies reviewed with patient and updated if appropriate.  Patient Active Problem List   Diagnosis Date Noted   PCOS (polycystic ovarian syndrome) 02/12/2021   Iron deficiency anemia 02/12/2021   Dysthymia 02/12/2021   Cellulitis of right lower extremity 01/29/2021   Shingles 01/29/2021   Fatigue 12/16/2020   Impaired attention 12/16/2020   Anxiety 11/04/2020   Acute midline low back pain with bilateral sciatica 09/18/2020   Allergic rhinitis 02/01/2017   Genital herpes simplex 02/01/2017   Prediabetes 11/24/2016   Vitamin D deficiency 11/24/2016   S/P primary low transverse C-section 02/28/2016   Obesity with body mass index 30 or greater 08/31/2015   Seasonal asthma 08/06/2011   Morbid obesity ([place]) 08/06/2011   Fibroids 08/06/2011    Current Outpatient Medications on File Prior to Visit  Medication Sig Dispense Refill   albuterol (VENTOLIN HFA) 108 (90 Base) MCG/ACT inhaler INHALE 1-2 PUFFS INTO THE LUNGS EVERY 6 (SIX) HOURS AS NEEDED FOR WHEEZING OR SHORTNESS OF BREATH. 18 each 5    buPROPion (WELLBUTRIN XL) 150 MG 24 hr tablet Take 1 daily in morning for one week, then increase to 2 tabs daily in morning 60 tablet 3   escitalopram (LEXAPRO) 10 MG tablet Take 10 mg by mouth daily.     fluticasone (FLONASE) 50 MCG/ACT nasal spray Place 2 sprays into both nostrils daily. 16 g 6   loratadine (CLARITIN) 10 MG tablet Claritin     metFORMIN (GLUCOPHAGE-XR) 500 MG 24 hr tablet metformin ER 500 mg tablet,extended release 24 hr  TAKE 1 TABLET BY MOUTH EVERY DAY     methocarbamol (ROBAXIN) 500 MG tablet Take 1 tablet (500 mg total) by mouth every 6 (six) hours as needed for muscle spasms. 30 tablet 0   montelukast (SINGULAIR) 10 MG tablet TAKE 1 TABLET BY MOUTH EVERYDAY AT BEDTIME 90 tablet 1   Olopatadine HCl 0.2 % SOLN Apply 1 drop to eye in the morning and at bedtime. 2.5 mL 5   pantoprazole (PROTONIX) 40 MG tablet TAKE 1 TABLET BY MOUTH EVERY DAY 90 tablet 0   tranexamic acid (LYSTEDA) 650 MG TABS tablet Take 1,300 mg by mouth every 8 (eight) hours as needed.     escitalopram (LEXAPRO) 20 MG tablet Take 1 tablet (20 mg total) by mouth daily. (Patient not taking: Reported on 06/05/2021) 30 tablet 5   loratadine (CLARITIN) 10 MG tablet Claritin 10 mg tablet  Take 1 tablet every day by oral route as needed.  No current facility-administered medications on file prior to visit.    Past Medical History:  Diagnosis Date   Abnormal Pap smear 2007   colpo   Asthma    childhood - PRN inhaler   Fibroid    4 fibroids    Gestational diabetes    first pregnancy, diet controlled   H/O chlamydia infection 2010   H/O varicella    Mass of neck 03/02/2014   Obese    PCOS (polycystic ovarian syndrome)    Rubella non-immune status 01/11/2012   Vaginal Pap smear, abnormal    biopsy - normal     Past Surgical History:  Procedure Laterality Date   CESAREAN SECTION N/A 02/28/2016   Procedure: CESAREAN SECTION;  Surgeon: [name], MD;  Location: [place] [facility];  Service:  Obstetrics;  Laterality: N/A;   CESAREAN SECTION WITH BILATERAL TUBAL LIGATION Bilateral 09/29/2018   Procedure: REPEAT CESAREAN SECTION WITH BILATERAL TUBAL LIGATION;  Surgeon: [name], MD;  Location: [facility];  Service: Obstetrics;  Laterality: Bilateral;    Social History   Socioeconomic History   Marital status: Single    Spouse name: Not on file   Number of children: Not on file   Years of education: Not on file   Highest education level: Not on file  Occupational History   Not on file  Tobacco Use   Smoking status: Never   Smokeless tobacco: Never  Vaping Use   Vaping Use: Never used  Substance and Sexual Activity   Alcohol use: Yes    Comment: occasional   Drug use: No   Sexual activity: Yes    Birth control/protection: None    Comment: sept 2017 - removed   Other Topics Concern   Not on file  Social History Narrative   Engaged, 13 year old daughter   Social Determinants of Health   Financial Resource Strain: Not on file  Food Insecurity: Not on file  Transportation Needs: Not on file  Physical Activity: Insufficiently Active   Days of Exercise per Week: 1 day   Minutes of Exercise per Session: 30 min  Stress: Stress Concern Present   Feeling of Stress : Rather much  Social Connections: Unknown   Frequency of Communication with Friends and Family: Not on file   Frequency of Social Gatherings with Friends and Family: Not on file   Attends Religious Services: 1 to 4 times per year   Active Member of [employer] or Organizations: No   Attends Music therapist: Not on file   Marital Status: Never married    Family History  Problem Relation Age of Onset   Hypertension Mother        diet controlled no meds   Hypertension Father    Alcohol abuse Father    Heart disease Maternal Aunt    Mental illness Maternal Aunt        bipolar   Diabetes Maternal Aunt    Heart disease Maternal Grandmother    Hypertension Maternal Grandmother     Thrombophlebitis Maternal Grandmother    Diabetes Maternal Grandmother    Stroke Maternal Grandmother    Hypertension Paternal Grandmother    Heart disease Paternal Grandmother    Asthma Cousin    Cancer Cousin    Heart disease Cousin        congenital heart disease   Learning disabilities Cousin    Anesthesia problems Neg Hx     Review of Systems  Constitutional:  Positive for fatigue.  Negative for chills and fever.  Respiratory:  Negative for cough, shortness of breath and wheezing.   Cardiovascular:  Positive for palpitations (occ flutter). Negative for chest pain and leg swelling.  Gastrointestinal:  Negative for abdominal pain, constipation and nausea.  Genitourinary:  Positive for frequency and urgency. Negative for dysuria and hematuria.  Neurological:  Positive for dizziness and headaches (occ).      Objective:   Vitals:   06/05/21 0814  BP: 120/84  Pulse: 87  Temp: 98.7 F (37.1 C)  SpO2: 99%   BP Readings from Last 3 Encounters:  06/05/21 120/84  03/23/21 (!) 88/55  03/06/21 119/77   Wt Readings from Last 3 Encounters:  06/05/21 254 lb 3.2 oz (115.3 kg)  03/23/21 245 lb (111.1 kg)  11/04/20 256 lb (116.1 kg)   Body mass index is 44.32 kg/m.   Physical Exam    Constitutional: Appears well-developed and well-nourished. No distress.  Head: Normocephalic and atraumatic.  Neck: Neck supple. No tracheal deviation present. No thyromegaly present.  No cervical lymphadenopathy Cardiovascular: Normal rate, regular rhythm and normal heart sounds.  No murmur heard. No carotid bruit .  No edema Pulmonary/Chest: Effort normal and breath sounds normal. No respiratory distress. No has no wheezes. No rales.  Skin: Skin is warm and dry. Not diaphoretic.  Psychiatric: Normal mood and affect. Behavior is normal.       Assessment & Plan:    See Problem List for Assessment and Plan of chronic medical problems.

## 2021-06-05 ENCOUNTER — Encounter: Payer: Self-pay | Admitting: Internal Medicine

## 2021-06-05 ENCOUNTER — Telehealth: Payer: Self-pay

## 2021-06-05 ENCOUNTER — Other Ambulatory Visit: Payer: Self-pay

## 2021-06-05 ENCOUNTER — Ambulatory Visit (INDEPENDENT_AMBULATORY_CARE_PROVIDER_SITE_OTHER): Payer: 59 | Admitting: Internal Medicine

## 2021-06-05 VITALS — BP 120/84 | HR 87 | Temp 98.7°F | Ht 63.5 in | Wt 254.2 lb

## 2021-06-05 DIAGNOSIS — R350 Frequency of micturition: Secondary | ICD-10-CM

## 2021-06-05 DIAGNOSIS — F419 Anxiety disorder, unspecified: Secondary | ICD-10-CM

## 2021-06-05 DIAGNOSIS — E282 Polycystic ovarian syndrome: Secondary | ICD-10-CM

## 2021-06-05 DIAGNOSIS — F341 Dysthymic disorder: Secondary | ICD-10-CM

## 2021-06-05 DIAGNOSIS — R42 Dizziness and giddiness: Secondary | ICD-10-CM | POA: Insufficient documentation

## 2021-06-05 DIAGNOSIS — D500 Iron deficiency anemia secondary to blood loss (chronic): Secondary | ICD-10-CM

## 2021-06-05 DIAGNOSIS — J301 Allergic rhinitis due to pollen: Secondary | ICD-10-CM

## 2021-06-05 DIAGNOSIS — R5383 Other fatigue: Secondary | ICD-10-CM

## 2021-06-05 LAB — CBC WITH DIFFERENTIAL/PLATELET
Basophils Absolute: 0 10*3/uL (ref 0.0–0.1)
Basophils Relative: 0.4 % (ref 0.0–3.0)
Eosinophils Absolute: 0.1 10*3/uL (ref 0.0–0.7)
Eosinophils Relative: 4 % (ref 0.0–5.0)
HCT: 25.9 % — ABNORMAL LOW (ref 36.0–46.0)
Hemoglobin: 7.6 g/dL — CL (ref 12.0–15.0)
Lymphocytes Relative: 48.4 % — ABNORMAL HIGH (ref 12.0–46.0)
Lymphs Abs: 1.7 10*3/uL (ref 0.7–4.0)
MCHC: 29.4 g/dL — ABNORMAL LOW (ref 30.0–36.0)
MCV: 64 fl — ABNORMAL LOW (ref 78.0–100.0)
Monocytes Absolute: 0.3 10*3/uL (ref 0.1–1.0)
Monocytes Relative: 8.2 % (ref 3.0–12.0)
Neutro Abs: 1.4 10*3/uL (ref 1.4–7.7)
Neutrophils Relative %: 39 % — ABNORMAL LOW (ref 43.0–77.0)
Platelets: 310 10*3/uL (ref 150.0–400.0)
RBC: 4.05 Mil/uL (ref 3.87–5.11)
RDW: 22.8 % — ABNORMAL HIGH (ref 11.5–15.5)
WBC: 3.5 10*3/uL — ABNORMAL LOW (ref 4.0–10.5)

## 2021-06-05 LAB — COMPREHENSIVE METABOLIC PANEL
ALT: 15 U/L (ref 0–35)
AST: 14 U/L (ref 0–37)
Albumin: 4.2 g/dL (ref 3.5–5.2)
Alkaline Phosphatase: 30 U/L — ABNORMAL LOW (ref 39–117)
BUN: 13 mg/dL (ref 6–23)
CO2: 26 mEq/L (ref 19–32)
Calcium: 8.6 mg/dL (ref 8.4–10.5)
Chloride: 105 mEq/L (ref 96–112)
Creatinine, Ser: 0.9 mg/dL (ref 0.40–1.20)
GFR: 84.28 mL/min (ref >60.00)
Glucose, Bld: 81 mg/dL (ref 70–99)
Potassium: 4 mEq/L (ref 3.5–5.1)
Sodium: 138 mEq/L (ref 135–145)
Total Bilirubin: 0.3 mg/dL (ref 0.2–1.2)
Total Protein: 7.5 g/dL (ref 6.0–8.3)

## 2021-06-05 NOTE — Assessment & Plan Note (Signed)
Acute Likely related to anemia Cbc, cmp,iron panel

## 2021-06-05 NOTE — Telephone Encounter (Signed)
Noted - message sent  via mychart

## 2021-06-05 NOTE — Assessment & Plan Note (Signed)
Chronic Stable continue lexparo 10 mg daily and wellbutrin xl 150 mg daily

## 2021-06-05 NOTE — Assessment & Plan Note (Signed)
Chronic Having very heavy menses  Not taking metformin Has iron def anemia  Recently has had a lot of bleeding Taking MVI Not taking additional iron - start iron dialy

## 2021-06-05 NOTE — Patient Instructions (Addendum)
    Blood work was ordered.      Medications changes include :   start the otc iron daily.

## 2021-06-05 NOTE — Assessment & Plan Note (Addendum)
Acute Having heavy menses - for uterine ablation in January Not taking metformin Likely worsening of anemia which is causing her dizziness, PICA with ice, tinnitus and fatigue Taking MVI Not taking additional iron - start iron dialy Cbc, cmp,iron panel

## 2021-06-05 NOTE — Assessment & Plan Note (Signed)
Chronic Controlled, stable Continue claritin, flonase, Singulair 10 mg daily

## 2021-06-05 NOTE — Assessment & Plan Note (Signed)
Acute  Had symptoms last week - they are better now after drinking cranberry juice Urine dip here today negative for infection Since she has no symptoms will hold off on culture

## 2021-06-05 NOTE — Addendum Note (Signed)
Addended by: Binnie Rail on: 06/05/2021 03:14 PM   Modules accepted: Orders

## 2021-06-05 NOTE — Assessment & Plan Note (Signed)
Chronic Somewhat controlled - she wonders about possibly increasing the lexapro Continue lexapro 10 mg daily  - we have discussed increased increasing the lexapro to 20 mg and will is thinking about doing that.

## 2021-06-06 LAB — IRON,TIBC AND FERRITIN PANEL
%SAT: 3 % (calc) — ABNORMAL LOW (ref 16–45)
Ferritin: 2 ng/mL — ABNORMAL LOW (ref 16–154)
Iron: 16 ug/dL — ABNORMAL LOW (ref 40–190)
TIBC: 489 mcg/dL (calc) — ABNORMAL HIGH (ref 250–450)

## 2021-06-13 ENCOUNTER — Ambulatory Visit: Admit: 2021-06-13 | Payer: 59 | Admitting: Obstetrics and Gynecology

## 2021-06-13 SURGERY — DILATATION & CURETTAGE/HYSTEROSCOPY WITH NOVASURE ABLATION
Anesthesia: Choice

## 2021-08-26 ENCOUNTER — Other Ambulatory Visit: Payer: Self-pay | Admitting: Internal Medicine

## 2021-08-31 ENCOUNTER — Encounter: Payer: Self-pay | Admitting: Internal Medicine

## 2021-08-31 NOTE — Progress Notes (Signed)
Subjective:    Patient ID: Brenda [name], female    DOB: [birth year], 36 y.o.   MRN: [id number]  This visit occurred during the SARS-CoV-2 public health emergency.  Safety protocols were in place, including screening questions prior to the visit, additional usage of staff PPE, and extensive cleaning of exam room while observing appropriate contact time as indicated for disinfecting solutions.    HPI The patient is here for an acute visit.   She made the appointment today for-fatigue, weight gain and fluid retention    She feels fatigued, but has some good days.  She is taking B12-just started that.  She is taking the centrum and iron.  She has irregular periods - it can be heavy or just spotting.  She did not end up having the ablation done last fall, but think she still needs to have it done at some point.  She feels like she is retaining fluids - it is just in her arms.  She denies fluid in her legs.  She denies shortness of breath.  She knows she has gained weight and feels like her arms are just puffy.   She is not motivated to exercise.  She is eating poorly - fast food.  She states she does not eat that much but when she does eat she is not eating the correct things.  Medications and allergies reviewed with patient and updated if appropriate.  Patient Active Problem List   Diagnosis Date Noted   Dizziness 06/05/2021   Urinary frequency 06/05/2021   PCOS (polycystic ovarian syndrome) 02/12/2021   Iron deficiency anemia 02/12/2021   Dysthymia 02/12/2021   Cellulitis of right lower extremity 01/29/2021   Shingles 01/29/2021   Fatigue 12/16/2020   Impaired attention 12/16/2020   Anxiety 11/04/2020   Acute midline low back pain with bilateral sciatica 09/18/2020   Allergic rhinitis 02/01/2017   Genital herpes simplex 02/01/2017   Prediabetes 11/24/2016   Vitamin D deficiency 11/24/2016   S/P primary low transverse C-section 02/28/2016   Obesity with body mass index  30 or greater 08/31/2015   Seasonal asthma 08/06/2011   Morbid obesity ([place]) 08/06/2011   Fibroids 08/06/2011    Current Outpatient Medications on File Prior to Visit  Medication Sig Dispense Refill   albuterol (VENTOLIN HFA) 108 (90 Base) MCG/ACT inhaler INHALE 1-2 PUFFS INTO THE LUNGS EVERY 6 (SIX) HOURS AS NEEDED FOR WHEEZING OR SHORTNESS OF BREATH. 18 each 5   buPROPion (WELLBUTRIN XL) 150 MG 24 hr tablet Take 1 daily in morning for one week, then increase to 2 tabs daily in morning 60 tablet 3   escitalopram (LEXAPRO) 10 MG tablet Take 10 mg by mouth daily.     fluticasone (FLONASE) 50 MCG/ACT nasal spray Place 2 sprays into both nostrils daily. 16 g 6   loratadine (CLARITIN) 10 MG tablet Claritin     methocarbamol (ROBAXIN) 500 MG tablet Take 1 tablet (500 mg total) by mouth every 6 (six) hours as needed for muscle spasms. 30 tablet 0   montelukast (SINGULAIR) 10 MG tablet TAKE 1 TABLET BY MOUTH EVERYDAY AT BEDTIME 90 tablet 1   Olopatadine HCl 0.2 % SOLN Apply 1 drop to eye in the morning and at bedtime. 2.5 mL 5   pantoprazole (PROTONIX) 40 MG tablet TAKE 1 TABLET BY MOUTH EVERY DAY 90 tablet 0   tranexamic acid (LYSTEDA) 650 MG TABS tablet Take 1,300 mg by mouth every 8 (eight) hours as needed.  escitalopram (LEXAPRO) 20 MG tablet Take 20 mg by mouth daily. (Patient not taking: Reported on 09/01/2021)     No current facility-administered medications on file prior to visit.    Past Medical History:  Diagnosis Date   Abnormal Pap smear 2007   colpo   Asthma    childhood - PRN inhaler   Fibroid    4 fibroids    Gestational diabetes    first pregnancy, diet controlled   H/O chlamydia infection 2010   H/O varicella    Mass of neck 03/02/2014   Obese    PCOS (polycystic ovarian syndrome)    Rubella non-immune status 01/11/2012   Vaginal Pap smear, abnormal    biopsy - normal     Past Surgical History:  Procedure Laterality Date   CESAREAN SECTION N/A 02/28/2016    Procedure: CESAREAN SECTION;  Surgeon: [name], MD;  Location: [place];  Service: Obstetrics;  Laterality: N/A;   CESAREAN SECTION WITH BILATERAL TUBAL LIGATION Bilateral 09/29/2018   Procedure: REPEAT CESAREAN SECTION WITH BILATERAL TUBAL LIGATION;  Surgeon: [name], MD;  Location: [facility];  Service: Obstetrics;  Laterality: Bilateral;    Social History   Socioeconomic History   Marital status: Single    Spouse name: Not on file   Number of children: Not on file   Years of education: Not on file   Highest education level: Not on file  Occupational History   Not on file  Tobacco Use   Smoking status: Never   Smokeless tobacco: Never  Vaping Use   Vaping Use: Never used  Substance and Sexual Activity   Alcohol use: Yes    Comment: occasional   Drug use: No   Sexual activity: Yes    Birth control/protection: None    Comment: sept 2017 - removed   Other Topics Concern   Not on file  Social History Narrative   Engaged, 72 year old daughter   Social Determinants of Health   Financial Resource Strain: Not on file  Food Insecurity: Not on file  Transportation Needs: Not on file  Physical Activity: Insufficiently Active   Days of Exercise per Week: 1 day   Minutes of Exercise per Session: 30 min  Stress: Stress Concern Present   Feeling of Stress : Rather much  Social Connections: Unknown   Frequency of Communication with Friends and Family: Not on file   Frequency of Social Gatherings with Friends and Family: Not on file   Attends Religious Services: 1 to 4 times per year   Active Member of [employer] or Organizations: No   Attends Music therapist: Not on file   Marital Status: Never married    Family History  Problem Relation Age of Onset   Hypertension Mother        diet controlled no meds   Hypertension Father    Alcohol abuse Father    Heart disease Maternal Aunt    Mental illness Maternal Aunt        bipolar    Diabetes Maternal Aunt    Heart disease Maternal Grandmother    Hypertension Maternal Grandmother    Thrombophlebitis Maternal Grandmother    Diabetes Maternal Grandmother    Stroke Maternal Grandmother    Hypertension Paternal Grandmother    Heart disease Paternal Grandmother    Asthma Cousin    Cancer Cousin    Heart disease Cousin        congenital heart disease   Learning disabilities  Cousin    Anesthesia problems Neg Hx     Review of Systems  Constitutional:  Positive for diaphoresis (night sweats) and fatigue. Negative for fever.  Respiratory:  Negative for cough, shortness of breath and wheezing.   Cardiovascular:  Positive for palpitations (rare). Negative for chest pain and leg swelling.  Gastrointestinal:  Negative for abdominal pain, blood in stool, constipation, diarrhea and nausea.  Genitourinary:  Positive for menstrual problem (irregular - spotting - heavy).  Neurological:  Negative for light-headedness and headaches.      Objective:   Vitals:   09/01/21 0853  BP: 138/80  Pulse: 99  Temp: 98.3 F (36.8 C)  SpO2: 99%   BP Readings from Last 3 Encounters:  09/01/21 138/80  06/05/21 120/84  03/23/21 (!) 88/55   Wt Readings from Last 3 Encounters:  09/01/21 257 lb 9.6 oz (116.8 kg)  06/05/21 254 lb 3.2 oz (115.3 kg)  03/23/21 245 lb (111.1 kg)   Body mass index is 44.92 kg/m.   Depression screen [facility] 2/9 09/01/2021 09/18/2020 12/01/2016  Decreased Interest 0 0 0  Down, Depressed, Hopeless 0 0 0  PHQ - 2 Score 0 0 0  Altered sleeping 0 - -  Tired, decreased energy 0 - -  Change in appetite 0 - -  Feeling bad or failure about yourself  0 - -  Trouble concentrating 0 - -  Moving slowly or fidgety/restless 0 - -  Suicidal thoughts 0 - -  PHQ-9 Score 0 - -    GAD 7 : Generalized Anxiety Score 09/01/2021  Nervous, Anxious, on Edge 0  Control/stop worrying 0  Worry too much - different things 0  Trouble relaxing 0  Restless 0  Easily annoyed or  irritable 0  Afraid - awful might happen 0  Total GAD 7 Score 0      Physical Exam Constitutional:      General: She is not in acute distress.    Appearance: Normal appearance. She is not ill-appearing.  HENT:     Head: Normocephalic and atraumatic.  Cardiovascular:     Rate and Rhythm: Normal rate and regular rhythm.     Heart sounds: No murmur heard. Pulmonary:     Effort: Pulmonary effort is normal. No respiratory distress.     Breath sounds: No wheezing or rales.  Musculoskeletal:     Right lower leg: No edema.     Left lower leg: No edema.     Comments: No swelling in upper extremities  Neurological:     Mental Status: She is alert.  Psychiatric:        Mood and Affect: Mood normal.        Behavior: Behavior normal.           Assessment & Plan:       See Problem List for Assessment and Plan of chronic medical problems.

## 2021-09-01 ENCOUNTER — Other Ambulatory Visit: Payer: Self-pay

## 2021-09-01 ENCOUNTER — Encounter: Payer: Self-pay | Admitting: Internal Medicine

## 2021-09-01 ENCOUNTER — Ambulatory Visit (INDEPENDENT_AMBULATORY_CARE_PROVIDER_SITE_OTHER): Payer: 59 | Admitting: Internal Medicine

## 2021-09-01 VITALS — BP 138/80 | HR 99 | Temp 98.3°F | Ht 63.5 in | Wt 257.6 lb

## 2021-09-01 DIAGNOSIS — D500 Iron deficiency anemia secondary to blood loss (chronic): Secondary | ICD-10-CM | POA: Diagnosis not present

## 2021-09-01 DIAGNOSIS — R5383 Other fatigue: Secondary | ICD-10-CM | POA: Diagnosis not present

## 2021-09-01 DIAGNOSIS — R7303 Prediabetes: Secondary | ICD-10-CM

## 2021-09-01 DIAGNOSIS — R635 Abnormal weight gain: Secondary | ICD-10-CM

## 2021-09-01 DIAGNOSIS — F341 Dysthymic disorder: Secondary | ICD-10-CM

## 2021-09-01 DIAGNOSIS — Z1331 Encounter for screening for depression: Secondary | ICD-10-CM

## 2021-09-01 DIAGNOSIS — F419 Anxiety disorder, unspecified: Secondary | ICD-10-CM

## 2021-09-01 LAB — CBC WITH DIFFERENTIAL/PLATELET
Basophils Absolute: 0 10*3/uL (ref 0.0–0.1)
Basophils Relative: 0.7 % (ref 0.0–3.0)
Eosinophils Absolute: 0.2 10*3/uL (ref 0.0–0.7)
Eosinophils Relative: 4.7 % (ref 0.0–5.0)
HCT: 33.3 % — ABNORMAL LOW (ref 36.0–46.0)
Hemoglobin: 10.4 g/dL — ABNORMAL LOW (ref 12.0–15.0)
Lymphocytes Relative: 42.5 % (ref 12.0–46.0)
Lymphs Abs: 1.4 10*3/uL (ref 0.7–4.0)
MCHC: 31.3 g/dL (ref 30.0–36.0)
MCV: 73.4 fl — ABNORMAL LOW (ref 78.0–100.0)
Monocytes Absolute: 0.3 10*3/uL (ref 0.1–1.0)
Monocytes Relative: 8.7 % (ref 3.0–12.0)
Neutro Abs: 1.4 10*3/uL (ref 1.4–7.7)
Neutrophils Relative %: 43.4 % (ref 43.0–77.0)
Platelets: 248 10*3/uL (ref 150.0–400.0)
RBC: 4.53 Mil/uL (ref 3.87–5.11)
RDW: 24.4 % — ABNORMAL HIGH (ref 11.5–15.5)
WBC: 3.3 10*3/uL — ABNORMAL LOW (ref 4.0–10.5)

## 2021-09-01 LAB — COMPREHENSIVE METABOLIC PANEL
ALT: 14 U/L (ref 0–35)
AST: 14 U/L (ref 0–37)
Albumin: 4 g/dL (ref 3.5–5.2)
Alkaline Phosphatase: 30 U/L — ABNORMAL LOW (ref 39–117)
BUN: 16 mg/dL (ref 6–23)
CO2: 28 mEq/L (ref 19–32)
Calcium: 9.2 mg/dL (ref 8.4–10.5)
Chloride: 103 mEq/L (ref 96–112)
Creatinine, Ser: 0.81 mg/dL (ref 0.40–1.20)
GFR: 95.48 mL/min (ref >60.00)
Glucose, Bld: 77 mg/dL (ref 70–99)
Potassium: 3.8 mEq/L (ref 3.5–5.1)
Sodium: 138 mEq/L (ref 135–145)
Total Bilirubin: 0.3 mg/dL (ref 0.2–1.2)
Total Protein: 7 g/dL (ref 6.0–8.3)

## 2021-09-01 LAB — IBC PANEL
Iron: 45 ug/dL (ref 42–145)
Saturation Ratios: 9.8 % — ABNORMAL LOW (ref 20.0–50.0)
TIBC: 460.6 ug/dL — ABNORMAL HIGH (ref 250.0–450.0)
Transferrin: 329 mg/dL (ref 212.0–360.0)

## 2021-09-01 LAB — TSH: TSH: 0.68 u[IU]/mL (ref 0.35–5.50)

## 2021-09-01 LAB — HEMOGLOBIN A1C: Hgb A1c MFr Bld: 5.6 % (ref 4.6–6.5)

## 2021-09-01 MED ORDER — OZEMPIC (0.25 OR 0.5 MG/DOSE) 2 MG/1.5ML ~~LOC~~ SOPN: PEN_INJECTOR | SUBCUTANEOUS | 5 refills | Status: DC

## 2021-09-01 NOTE — Assessment & Plan Note (Signed)
Chronic Controlled, Stable-GAD-7 score today 0 indicating anxiety is well controlled Continue bupropion 300 mg daily, Lexapro 10 mg daily

## 2021-09-01 NOTE — Assessment & Plan Note (Signed)
Chronic Check a1c Low sugar / carb diet Stressed regular exercise Discussed weight loss

## 2021-09-01 NOTE — Assessment & Plan Note (Signed)
Chronic Controlled, Stable-PHQ-9 score today 0 indicating dysthymia is well controlled-no evidence of depression Continue bupropion 20 mg daily, Lexapro 10 mg daily

## 2021-09-01 NOTE — Assessment & Plan Note (Signed)
Chronic History of heavy menses which has contributed to iron deficiency anemia-she states this is better-her periods are more irregular but not always heavy, sometimes light She is taking iron supplementation Check CBC, iron panel May need to follow-up with GYN to consider ablation

## 2021-09-01 NOTE — Patient Instructions (Addendum)
°  Blood work was ordered.     Medications changes include :   start ozempic 0.25 mg weekly for 4 weeks then ozempic 0.5 mg   Your prescription(s) have been submitted to your pharmacy. Please take as directed and contact our office if you believe you are having problem(s) with the medication(s).    Please followup in 3 months

## 2021-09-01 NOTE — Assessment & Plan Note (Signed)
Acute Some days she feels fatigued and she does have some good days where she does not Has a history of iron deficiency anemia related to heavy menses No evidence of depression or anxiety Check CBC, iron panel She is not exercising regularly-stressed regular exercise which will increase her energy level Just started taking B12-continue that may also help Check TSH, CMP

## 2021-09-01 NOTE — Assessment & Plan Note (Addendum)
Acute She has gained weight She was concerned about with fluid retention in her arms, but I did not see any evidence of swelling or fluid retention-I think it is all related to weight gain Discussed weight loss Discussed that she needs to start exercising regularly, eat healthier and eat less She is a prediabetic and high risk of developing diabetes.  She is also has PCOS causing iron deficiency anemia Will start Ozempic 0.25 mg weekly x4 weeks and then increase to 0.5 mg weekly Follow-up in 3 months

## 2021-10-02 ENCOUNTER — Encounter: Payer: Self-pay | Admitting: Internal Medicine

## 2021-10-24 ENCOUNTER — Encounter: Payer: Self-pay | Admitting: Internal Medicine

## 2021-11-03 ENCOUNTER — Telehealth: Payer: Self-pay

## 2021-11-03 NOTE — Telephone Encounter (Signed)
Delton See (Key: [id number]) ? ?[facility] is reviewing your PA request. Typically an electronic response will be received within 24-72 hours. To check for an update later, open this request from your dashboard. ? ?You may close this dialog and return to your dashboard to perform other tasks. ?

## 2021-11-11 ENCOUNTER — Encounter: Payer: Self-pay | Admitting: Internal Medicine

## 2021-11-12 MED ORDER — METFORMIN HCL 500 MG PO TABS: 500.0000 mg | ORAL_TABLET | Freq: Two times a day (BID) | ORAL | 3 refills | Status: DC

## 2021-11-12 NOTE — Addendum Note (Signed)
Addended by: Binnie Rail on: 11/12/2021 08:43 PM ? ? Modules accepted: Orders ? ?

## 2021-12-01 ENCOUNTER — Encounter: Payer: Self-pay | Admitting: Internal Medicine

## 2021-12-01 NOTE — Progress Notes (Signed)
? ? ? ? ?Subjective:  ? ? Patient ID: Brenda [name], female    DOB: [birth year], 36 y.o.   MRN: [id number] ? ?This visit occurred during the SARS-CoV-2 public health emergency.  Safety protocols were in place, including screening questions prior to the visit, additional usage of staff PPE, and extensive cleaning of exam room while observing appropriate contact time as indicated for disinfecting solutions.   ? ? ?HPI ?[name] is here for follow up of her chronic medical problems, including fatigue, iron def anemia, obesity ? ?Ozempic is not covered.   We started metformin 500 mg bid.  ? ?Medications and allergies reviewed with patient and updated if appropriate. ? ?Current Outpatient Medications on File Prior to Visit  ?Medication Sig Dispense Refill  ? albuterol (VENTOLIN HFA) 108 (90 Base) MCG/ACT inhaler INHALE 1-2 PUFFS INTO THE LUNGS EVERY 6 (SIX) HOURS AS NEEDED FOR WHEEZING OR SHORTNESS OF BREATH. 18 each 5  ? buPROPion (WELLBUTRIN XL) 150 MG 24 hr tablet Take 1 daily in morning for one week, then increase to 2 tabs daily in morning 60 tablet 3  ? escitalopram (LEXAPRO) 10 MG tablet Take 10 mg by mouth daily.    ? fluticasone (FLONASE) 50 MCG/ACT nasal spray Place 2 sprays into both nostrils daily. 16 g 6  ? loratadine (CLARITIN) 10 MG tablet Claritin    ? metFORMIN (GLUCOPHAGE) 500 MG tablet Take 1 tablet (500 mg total) by mouth 2 (two) times daily with a meal. 180 tablet 3  ? methocarbamol (ROBAXIN) 500 MG tablet Take 1 tablet (500 mg total) by mouth every 6 (six) hours as needed for muscle spasms. 30 tablet 0  ? montelukast (SINGULAIR) 10 MG tablet TAKE 1 TABLET BY MOUTH EVERYDAY AT BEDTIME 90 tablet 1  ? Olopatadine HCl 0.2 % SOLN Apply 1 drop to eye in the morning and at bedtime. 2.5 mL 5  ? pantoprazole (PROTONIX) 40 MG tablet TAKE 1 TABLET BY MOUTH EVERY DAY 90 tablet 0  ? tranexamic acid (LYSTEDA) 650 MG TABS tablet Take 1,300 mg by mouth every 8 (eight) hours as needed.    ? ?No current  facility-administered medications on file prior to visit.  ? ? ? ?Review of Systems ? ?   ?Objective:  ?There were no vitals filed for this visit. ?BP Readings from Last 3 Encounters:  ?09/01/21 138/80  ?06/05/21 120/84  ?03/23/21 (!) 88/55  ? ?Wt Readings from Last 3 Encounters:  ?09/01/21 257 lb 9.6 oz (116.8 kg)  ?06/05/21 254 lb 3.2 oz (115.3 kg)  ?03/23/21 245 lb (111.1 kg)  ? ?There is no height or weight on file to calculate BMI. ? ?  ?Physical Exam ?   ? ?Lab Results  ?Component Value Date  ? WBC 3.3 (L) 09/01/2021  ? HGB 10.4 (L) 09/01/2021  ? HCT 33.3 (L) 09/01/2021  ? PLT 248.0 09/01/2021  ? GLUCOSE 77 09/01/2021  ? CHOL 131 02/01/2017  ? TRIG 62.0 02/01/2017  ? HDL 36.00 (L) 02/01/2017  ? [place] 82 02/01/2017  ? ALT 14 09/01/2021  ? AST 14 09/01/2021  ? NA 138 09/01/2021  ? K 3.8 09/01/2021  ? CL 103 09/01/2021  ? CREATININE 0.81 09/01/2021  ? BUN 16 09/01/2021  ? CO2 28 09/01/2021  ? TSH 0.68 09/01/2021  ? HGBA1C 5.6 09/01/2021  ? ? ? ?Assessment & Plan:  ? ? ?See Problem List for Assessment and Plan of chronic medical problems.  ? ?This encounter was created in error - please  disregard. ?

## 2021-12-01 NOTE — Patient Instructions (Signed)
     Blood work was ordered.     Medications changes include :   none   Your prescription(s) have been sent to your pharmacy.    A referral was ordered for [facility] for a colonoscopy.     Someone from that office will call you to schedule an appointment.    Return in about 6 months (around 09/10/2022) for Physical Exam.   [facility] Phone: [phone]  

## 2021-12-02 ENCOUNTER — Other Ambulatory Visit: Payer: Self-pay | Admitting: Obstetrics and Gynecology

## 2021-12-02 ENCOUNTER — Encounter: Payer: 59 | Admitting: Internal Medicine

## 2021-12-02 NOTE — Assessment & Plan Note (Signed)
Chronic ?On metformin 500 mg twice daily ? ?

## 2021-12-02 NOTE — Assessment & Plan Note (Signed)
Chronic ?Related to heavy menses ?Blood work 3 months ago showed improvement in blood counts, iron levels ?Continue iron supplementation ?Following with GYN ? ?

## 2021-12-02 NOTE — Assessment & Plan Note (Signed)
Chronic ?Lab Results  ?Component Value Date  ? HGBA1C 5.6 09/01/2021  ? ?Continue metformin 500 mg twice daily ?

## 2021-12-12 ENCOUNTER — Ambulatory Visit (HOSPITAL_BASED_OUTPATIENT_CLINIC_OR_DEPARTMENT_OTHER): Admission: RE | Admit: 2021-12-12 | Payer: 59 | Source: Home / Self Care | Admitting: Obstetrics and Gynecology

## 2021-12-12 ENCOUNTER — Encounter (HOSPITAL_BASED_OUTPATIENT_CLINIC_OR_DEPARTMENT_OTHER): Admission: RE | Payer: Self-pay | Source: Home / Self Care

## 2021-12-12 SURGERY — DILATATION & CURETTAGE/HYSTEROSCOPY WITH NOVASURE ABLATION
Anesthesia: Choice

## 2021-12-31 ENCOUNTER — Other Ambulatory Visit: Payer: Self-pay

## 2021-12-31 ENCOUNTER — Emergency Department (HOSPITAL_BASED_OUTPATIENT_CLINIC_OR_DEPARTMENT_OTHER)
Admission: EM | Admit: 2021-12-31 | Discharge: 2021-12-31 | Disposition: A | Discharge status: Home / Self Care | Payer: 59 | Attending: Emergency Medicine | Admitting: Emergency Medicine

## 2021-12-31 ENCOUNTER — Encounter (HOSPITAL_BASED_OUTPATIENT_CLINIC_OR_DEPARTMENT_OTHER): Payer: Self-pay

## 2021-12-31 ENCOUNTER — Emergency Department (HOSPITAL_BASED_OUTPATIENT_CLINIC_OR_DEPARTMENT_OTHER): Payer: 59 | Admitting: Radiology

## 2021-12-31 DIAGNOSIS — Z5321 Procedure and treatment not carried out due to patient leaving prior to being seen by health care provider: Secondary | ICD-10-CM | POA: Insufficient documentation

## 2021-12-31 DIAGNOSIS — R0602 Shortness of breath: Secondary | ICD-10-CM | POA: Insufficient documentation

## 2021-12-31 LAB — CBC WITH DIFFERENTIAL/PLATELET
Abs Immature Granulocytes: 0.01 10*3/uL (ref 0.00–0.07)
Basophils Absolute: 0 10*3/uL (ref 0.0–0.1)
Basophils Relative: 1 %
Eosinophils Absolute: 0.3 10*3/uL (ref 0.0–0.5)
Eosinophils Relative: 6 %
HCT: 31.3 % — ABNORMAL LOW (ref 36.0–46.0)
Hemoglobin: 8.8 g/dL — ABNORMAL LOW (ref 12.0–15.0)
Immature Granulocytes: 0 %
Lymphocytes Relative: 58 %
Lymphs Abs: 2.6 10*3/uL (ref 0.7–4.0)
MCH: 21 pg — ABNORMAL LOW (ref 26.0–34.0)
MCHC: 28.1 g/dL — ABNORMAL LOW (ref 30.0–36.0)
MCV: 74.7 fL — ABNORMAL LOW (ref 80.0–100.0)
Monocytes Absolute: 0.3 10*3/uL (ref 0.1–1.0)
Monocytes Relative: 7 %
Neutro Abs: 1.3 10*3/uL — ABNORMAL LOW (ref 1.7–7.7)
Neutrophils Relative %: 28 %
Platelets: 345 10*3/uL (ref 150–400)
RBC: 4.19 MIL/uL (ref 3.87–5.11)
RDW: 20.7 % — ABNORMAL HIGH (ref 11.5–15.5)
WBC: 4.5 10*3/uL (ref 4.0–10.5)
nRBC: 0 % (ref 0.0–0.2)

## 2021-12-31 LAB — BASIC METABOLIC PANEL
Anion gap: 11 (ref 5–15)
BUN: 11 mg/dL (ref 6–20)
CO2: 24 mmol/L (ref 22–32)
Calcium: 9.2 mg/dL (ref 8.9–10.3)
Chloride: 103 mmol/L (ref 98–111)
Creatinine, Ser: 0.93 mg/dL (ref 0.44–1.00)
GFR, Estimated: >60 mL/min (ref >60)
Glucose, Bld: 103 mg/dL — ABNORMAL HIGH (ref 70–99)
Potassium: 3.7 mmol/L (ref 3.5–5.1)
Sodium: 138 mmol/L (ref 135–145)

## 2021-12-31 NOTE — ED Triage Notes (Signed)
Pt presents to the ED with [facility]. States that she is anemic with her most recent hemoglobin being 7.4 in [place]. States that she has been on her menstrual cycle for about 2 months with heavy bleeding and blood clots. States that her menstrual cycle ended yesterday. Pt A&Ox4 at time of triage. VSS. EKG obtained.

## 2022-01-02 ENCOUNTER — Encounter: Payer: Self-pay | Admitting: Internal Medicine

## 2022-01-10 ENCOUNTER — Encounter: Payer: Self-pay | Admitting: Internal Medicine

## 2022-01-10 NOTE — Progress Notes (Signed)
Virtual Visit via Video Note  I connected with Brenda [name] on 01/13/22 at  8:30 AM EDT by a video enabled telemedicine application and verified that I am speaking with the correct person using two identifiers.   I discussed the limitations of evaluation and management by telemedicine and the availability of in person appointments. The patient expressed understanding and agreed to proceed.  Present for the visit:  Myself, Dr Billey Gosling, Delton See.  The patient is currently at home and I am in the office.    No referring provider.    History of Present Illness: She is here for an acute visit.  She went to the ED recently for chest pain.  EKG was unchanged from prior.  Blood work was done.  She did not wait to be evaluated.  Reviewed EKG and blood work.  Iron deficiency anemia is worse - related to heavy menses/fibroids, endometrial polyp.  She is following with GYN. Taking tranexamic acid for bleeding as needed.  She states she is not taking the metformin-never started it. She will bleed for several weeks at a time.     Saw gyn 11/21/21 -  was going to have D & C with ablation but cancelled it due to copay she would need to pay due to her insurance.  She has severe dizziness/lightheadedness, irritability, depression, fatigue.  She has gained weight.  Overall she is not feeling well and not happy with her current state of health.  She does need a refill on Wellbutrin-ran out of it a few days ago.  She is taking the Lexapro and Wellbutrin daily expects for the last few days.  She is not sure what to do at this point      Review of Systems  Constitutional:  Positive for malaise/fatigue.  Respiratory:  Positive for shortness of breath.   Cardiovascular:  Positive for chest pain and palpitations.  Neurological:  Positive for dizziness and headaches.  Psychiatric/Behavioral:  Positive for depression. The patient is nervous/anxious.      Social History   Socioeconomic History    Marital status: Single    Spouse name: Not on file   Number of children: Not on file   Years of education: Not on file   Highest education level: Not on file  Occupational History   Not on file  Tobacco Use   Smoking status: Never   Smokeless tobacco: Never  Vaping Use   Vaping Use: Never used  Substance and Sexual Activity   Alcohol use: Yes    Comment: occasional   Drug use: No   Sexual activity: Yes    Birth control/protection: None    Comment: sept 2017 - removed   Other Topics Concern   Not on file  Social History Narrative   Engaged, 47 year old daughter   Social Determinants of Health   Financial Resource Strain: Not on file  Food Insecurity: Not on file  Transportation Needs: Not on file  Physical Activity: Insufficiently Active   Days of Exercise per Week: 1 day   Minutes of Exercise per Session: 30 min  Stress: Stress Concern Present   Feeling of Stress : Rather much  Social Connections: Unknown   Frequency of Communication with Friends and Family: Not on file   Frequency of Social Gatherings with Friends and Family: Not on file   Attends Religious Services: 1 to 4 times per year   Active Member of [employer] or Organizations: No   Attends Archivist  Meetings: Not on file   Marital Status: Never married     Observations/Objective: Appears well in NAD Breathing normally Depressed mood and affect  Assessment and Plan:  See Problem List for Assessment and Plan of chronic medical problems.   Follow Up Instructions:    I discussed the assessment and treatment plan with the patient. The patient was provided an opportunity to ask questions and all were answered. The patient agreed with the plan and demonstrated an understanding of the instructions.   The patient was advised to call back or seek an in-person evaluation if the symptoms worsen or if the condition fails to improve as anticipated.    Binnie Rail, MD

## 2022-01-13 ENCOUNTER — Telehealth (INDEPENDENT_AMBULATORY_CARE_PROVIDER_SITE_OTHER): Payer: 59 | Admitting: Internal Medicine

## 2022-01-13 DIAGNOSIS — F341 Dysthymic disorder: Secondary | ICD-10-CM

## 2022-01-13 DIAGNOSIS — E282 Polycystic ovarian syndrome: Secondary | ICD-10-CM

## 2022-01-13 DIAGNOSIS — F419 Anxiety disorder, unspecified: Secondary | ICD-10-CM | POA: Diagnosis not present

## 2022-01-13 DIAGNOSIS — D500 Iron deficiency anemia secondary to blood loss (chronic): Secondary | ICD-10-CM

## 2022-01-13 MED ORDER — ALBUTEROL SULFATE HFA 108 (90 BASE) MCG/ACT IN AERS: 1.0000 | INHALATION_SPRAY | Freq: Four times a day (QID) | RESPIRATORY_TRACT | 5 refills | Status: DC | PRN

## 2022-01-13 MED ORDER — BUPROPION HCL ER (XL) 150 MG PO TB24: 150.0000 mg | ORAL_TABLET | Freq: Every day | ORAL | 1 refills | Status: DC

## 2022-01-13 MED ORDER — ESCITALOPRAM OXALATE 10 MG PO TABS: 10.0000 mg | ORAL_TABLET | Freq: Every day | ORAL | 1 refills | Status: DC

## 2022-01-13 NOTE — Assessment & Plan Note (Signed)
Chronic Has heavy menses secondary to PCOS in addition to fibroids and endometrial polyp Currently not taking metformin-never started it Encouraged her to try it-it may help to some degree, but advised it would not fix the entire problem Metformin 500 mg twice daily

## 2022-01-13 NOTE — Assessment & Plan Note (Signed)
Chronic Related to heavy menses secondary to PCOS, fibroids and endometrial polyp Following with GYN-was to have an ablation, but was not able to afford the co-pay so had to cancel it She is going to call her insurance company to see about the co-pay for the above procedure versus possible partial hysterectomy which she would also be interested in Discussed the only way to really fix the problem is to stop the bleeding Encouraged her to start metformin 500 mg twice daily Discussed that I could refer her to hematology for possible iron infusion/transfusion if needed, but this is not can fix the problem-she needs to have a procedure to help stop the bleeding Current symptoms of chest pain, shortness of breath, palpitations, lightheadedness/dizziness, fatigue, irritability and depression among others are likely related to her anemia

## 2022-01-13 NOTE — Assessment & Plan Note (Signed)
Chronic Increased secondary to all of her symptoms related to her severe anemia Continue bupropion 150 mg daily and Lexapro 10 mg daily

## 2022-01-13 NOTE — Assessment & Plan Note (Signed)
Chronic Increased related to how she is feeling in her current medical problems Continue Lexapro 10 mg daily and bupropion 150 mg daily

## 2022-01-15 ENCOUNTER — Other Ambulatory Visit: Payer: Self-pay | Admitting: Obstetrics and Gynecology

## 2022-02-01 ENCOUNTER — Other Ambulatory Visit: Payer: Self-pay | Admitting: Internal Medicine

## 2022-02-02 MED ORDER — PANTOPRAZOLE SODIUM 40 MG PO TBEC: 40.0000 mg | DELAYED_RELEASE_TABLET | Freq: Every day | ORAL | 0 refills | Status: DC

## 2022-02-18 ENCOUNTER — Telehealth: Payer: Self-pay

## 2022-02-18 ENCOUNTER — Other Ambulatory Visit: Payer: Self-pay

## 2022-02-18 ENCOUNTER — Other Ambulatory Visit: Payer: Self-pay | Admitting: Internal Medicine

## 2022-02-18 ENCOUNTER — Other Ambulatory Visit (HOSPITAL_COMMUNITY): Payer: Self-pay

## 2022-02-18 MED ORDER — ESCITALOPRAM OXALATE 10 MG PO TABS: 10.0000 mg | ORAL_TABLET | Freq: Every day | ORAL | 1 refills | Status: DC

## 2022-02-18 MED ORDER — METHOCARBAMOL 500 MG PO TABS
500.0000 mg | ORAL_TABLET | Freq: Four times a day (QID) | ORAL | 0 refills | Status: DC | PRN
  Filled 2022-02-18: qty 30, 8d supply, fill #0

## 2022-02-18 MED ORDER — ALBUTEROL SULFATE HFA 108 (90 BASE) MCG/ACT IN AERS: 1.0000 | INHALATION_SPRAY | Freq: Four times a day (QID) | RESPIRATORY_TRACT | 5 refills | Status: DC | PRN

## 2022-02-18 MED ORDER — BUPROPION HCL ER (XL) 150 MG PO TB24: 150.0000 mg | ORAL_TABLET | Freq: Every day | ORAL | 1 refills | Status: DC

## 2022-02-18 NOTE — Telephone Encounter (Signed)
Sent in today 

## 2022-02-18 NOTE — Telephone Encounter (Signed)
Pt is requesting to have a;; Rx transferred to [facility].  Medication list: buPROPion (WELLBUTRIN XL) 150 MG 24 hr tablet escitalopram (LEXAPRO) 10 MG tablet albuterol (VENTOLIN HFA) 108 (90 Base) MCG/ACT inhaler  Pharmacy: [id number] [place], [place] AT [facility] OF [address]  LOV 09/01/21 LVV 01/13/22 ROV 03/06/22

## 2022-03-05 NOTE — Patient Instructions (Addendum)
     Blood work was ordered.     Medications changes include :   none   Return in about 6 months (around 09/06/2022) for follow up.

## 2022-03-05 NOTE — Progress Notes (Signed)
Subjective:    Patient ID: Brenda [name], female    DOB: 04/23/1986, 36 y.o.   MRN: [id number]     HPI Brenda [name] is here for follow up of her chronic medical problems, including PCOS, fibroids, endometrial polyp with heavy menstrual bleeding and iron def anemia (gyn), dysthmia, anxiety, GERD   Still having heavy bleeding.   She is taking iron.  She started taking the Gummies which she thinks is working better for her.  She will have the ablation August 15th.   We started metformin two weeks ago.  She does have a little more energy.     Has swelling just proximal to her wrists - no pain.  No finger or leg fluid.    Her reflux is much better with the medication prescribed.  Medications and allergies reviewed with patient and updated if appropriate.  Current Outpatient Medications on File Prior to Visit  Medication Sig Dispense Refill   albuterol (VENTOLIN HFA) 108 (90 Base) MCG/ACT inhaler Inhale 1-2 puffs into the lungs every 6 (six) hours as needed for wheezing or shortness of breath. 18 each 5   buPROPion (WELLBUTRIN XL) 150 MG 24 hr tablet Take 1 tablet (150 mg total) by mouth daily. 90 tablet 1   escitalopram (LEXAPRO) 10 MG tablet Take 1 tablet (10 mg total) by mouth daily. 90 tablet 1   escitalopram (LEXAPRO) 20 MG tablet Take 20 mg by mouth daily.     fluticasone (FLONASE) 50 MCG/ACT nasal spray Place 2 sprays into both nostrils daily. 16 g 6   loratadine (CLARITIN) 10 MG tablet Claritin     medroxyPROGESTERone (PROVERA) 10 MG tablet TK 1 T PO Q 6 H UNTIL BLEEDING STOPS THEN 1 T DAILY AFTER PROCEDURE     metFORMIN (GLUCOPHAGE) 500 MG tablet Take 1 tablet (500 mg total) by mouth 2 (two) times daily with a meal. 180 tablet 3   metFORMIN (GLUCOPHAGE-XR) 500 MG 24 hr tablet Take 500 mg by mouth daily.     methocarbamol (ROBAXIN) 500 MG tablet Take 1 tablet (500 mg total) by mouth every 6 (six) hours as needed for muscle spasms. 30 tablet 0   montelukast (SINGULAIR) 10 MG  tablet TAKE 1 TABLET BY MOUTH EVERYDAY AT BEDTIME 90 tablet 1   Olopatadine HCl 0.2 % SOLN Apply 1 drop to eye in the morning and at bedtime. 2.5 mL 5   pantoprazole (PROTONIX) 40 MG tablet Take 1 tablet (40 mg total) by mouth daily. 90 tablet 0   tranexamic acid (LYSTEDA) 650 MG TABS tablet Take 1,300 mg by mouth every 8 (eight) hours as needed.     No current facility-administered medications on file prior to visit.     Review of Systems  Respiratory:  Positive for shortness of breath (occ).   Cardiovascular:  Negative for chest pain, palpitations and leg swelling.  Neurological:  Positive for light-headedness and headaches.       Objective:   Vitals:   03/06/22 0949  BP: 130/74  Pulse: 87  Temp: 97.9 F (36.6 C)  SpO2: 99%   BP Readings from Last 3 Encounters:  03/06/22 130/74  12/31/21 127/89  09/01/21 138/80   Wt Readings from Last 3 Encounters:  03/06/22 254 lb (115.2 kg)  12/31/21 255 lb (115.7 kg)  09/01/21 257 lb 9.6 oz (116.8 kg)   Body mass index is 44.29 kg/m.    Physical Exam Constitutional:      General: She is not in  acute distress.    Appearance: Normal appearance.  HENT:     Head: Normocephalic and atraumatic.  Eyes:     Conjunctiva/sclera: Conjunctivae normal.  Cardiovascular:     Rate and Rhythm: Normal rate and regular rhythm.     Heart sounds: Normal heart sounds. No murmur heard. Pulmonary:     Effort: Pulmonary effort is normal. No respiratory distress.     Breath sounds: Normal breath sounds. No wheezing.  Musculoskeletal:     Cervical back: Neck supple.     Right lower leg: No edema.     Left lower leg: No edema.  Lymphadenopathy:     Cervical: No cervical adenopathy.  Skin:    General: Skin is warm and dry.     Findings: No rash.  Neurological:     Mental Status: She is alert. Mental status is at baseline.  Psychiatric:        Mood and Affect: Mood normal.        Behavior: Behavior normal.        Lab Results  Component  Value Date   WBC 4.5 12/31/2021   HGB 8.8 (L) 12/31/2021   HCT 31.3 (L) 12/31/2021   PLT 345 12/31/2021   GLUCOSE 103 (H) 12/31/2021   CHOL 131 02/01/2017   TRIG 62.0 02/01/2017   HDL 36.00 (L) 02/01/2017   LDLCALC 82 02/01/2017   ALT 14 09/01/2021   AST 14 09/01/2021   NA 138 12/31/2021   K 3.7 12/31/2021   CL 103 12/31/2021   CREATININE 0.93 12/31/2021   BUN 11 12/31/2021   CO2 24 12/31/2021   TSH 0.68 09/01/2021   HGBA1C 5.6 09/01/2021     Assessment & Plan:    See Problem List for Assessment and Plan of chronic medical problems.

## 2022-03-06 ENCOUNTER — Ambulatory Visit (INDEPENDENT_AMBULATORY_CARE_PROVIDER_SITE_OTHER): Payer: 59 | Admitting: Internal Medicine

## 2022-03-06 ENCOUNTER — Encounter: Payer: Self-pay | Admitting: Internal Medicine

## 2022-03-06 VITALS — BP 130/74 | HR 87 | Temp 97.9°F | Ht 63.5 in | Wt 254.0 lb

## 2022-03-06 DIAGNOSIS — E282 Polycystic ovarian syndrome: Secondary | ICD-10-CM

## 2022-03-06 DIAGNOSIS — F419 Anxiety disorder, unspecified: Secondary | ICD-10-CM | POA: Diagnosis not present

## 2022-03-06 DIAGNOSIS — F341 Dysthymic disorder: Secondary | ICD-10-CM | POA: Diagnosis not present

## 2022-03-06 DIAGNOSIS — K219 Gastro-esophageal reflux disease without esophagitis: Secondary | ICD-10-CM

## 2022-03-06 DIAGNOSIS — R7303 Prediabetes: Secondary | ICD-10-CM | POA: Diagnosis not present

## 2022-03-06 DIAGNOSIS — D500 Iron deficiency anemia secondary to blood loss (chronic): Secondary | ICD-10-CM

## 2022-03-06 LAB — COMPREHENSIVE METABOLIC PANEL
ALT: 21 U/L (ref 0–35)
AST: 17 U/L (ref 0–37)
Albumin: 4.2 g/dL (ref 3.5–5.2)
Alkaline Phosphatase: 33 U/L — ABNORMAL LOW (ref 39–117)
BUN: 16 mg/dL (ref 6–23)
CO2: 28 mEq/L (ref 19–32)
Calcium: 8.8 mg/dL (ref 8.4–10.5)
Chloride: 104 mEq/L (ref 96–112)
Creatinine, Ser: 1.02 mg/dL (ref 0.40–1.20)
GFR: 72.15 mL/min (ref >60.00)
Glucose, Bld: 82 mg/dL (ref 70–99)
Potassium: 3.8 mEq/L (ref 3.5–5.1)
Sodium: 139 mEq/L (ref 135–145)
Total Bilirubin: 0.2 mg/dL (ref 0.2–1.2)
Total Protein: 7.3 g/dL (ref 6.0–8.3)

## 2022-03-06 LAB — CBC WITH DIFFERENTIAL/PLATELET
Basophils Absolute: 0 10*3/uL (ref 0.0–0.1)
Basophils Relative: 0.6 % (ref 0.0–3.0)
Eosinophils Absolute: 0.1 10*3/uL (ref 0.0–0.7)
Eosinophils Relative: 3.1 % (ref 0.0–5.0)
HCT: 27.4 % — ABNORMAL LOW (ref 36.0–46.0)
Hemoglobin: 8.3 g/dL — ABNORMAL LOW (ref 12.0–15.0)
Lymphocytes Relative: 49.1 % — ABNORMAL HIGH (ref 12.0–46.0)
Lymphs Abs: 1.6 10*3/uL (ref 0.7–4.0)
MCHC: 30.4 g/dL (ref 30.0–36.0)
MCV: 68.9 fl — ABNORMAL LOW (ref 78.0–100.0)
Monocytes Absolute: 0.4 10*3/uL (ref 0.1–1.0)
Monocytes Relative: 12.6 % — ABNORMAL HIGH (ref 3.0–12.0)
Neutro Abs: 1.1 10*3/uL — ABNORMAL LOW (ref 1.4–7.7)
Neutrophils Relative %: 34.6 % — ABNORMAL LOW (ref 43.0–77.0)
Platelets: 328 10*3/uL (ref 150.0–400.0)
RBC: 3.98 Mil/uL (ref 3.87–5.11)
RDW: 22.5 % — ABNORMAL HIGH (ref 11.5–15.5)
WBC: 3.3 10*3/uL — ABNORMAL LOW (ref 4.0–10.5)

## 2022-03-06 LAB — IBC PANEL
Iron: 13 ug/dL — ABNORMAL LOW (ref 42–145)
Saturation Ratios: 2.5 % — ABNORMAL LOW (ref 20.0–50.0)
TIBC: 530.6 ug/dL — ABNORMAL HIGH (ref 250.0–450.0)
Transferrin: 379 mg/dL — ABNORMAL HIGH (ref 212.0–360.0)

## 2022-03-06 LAB — HEMOGLOBIN A1C: Hgb A1c MFr Bld: 6.1 % (ref 4.6–6.5)

## 2022-03-06 LAB — FERRITIN: Ferritin: 2.9 ng/mL — ABNORMAL LOW (ref 10.0–291.0)

## 2022-03-06 NOTE — Assessment & Plan Note (Signed)
Chronic Controlled, Stable Continue Lexapro 10 mg daily, Wellbutrin XL 150 mg daily

## 2022-03-06 NOTE — Assessment & Plan Note (Addendum)
Chronic Controlled, Stable Continue Lexapro 10 mg daily, Wellbutrin 150 mg daily 

## 2022-03-06 NOTE — Assessment & Plan Note (Addendum)
Chronic Related to heavy menses CBC, iron panel today She thinks she would be interested in iron infusions because she is so tired and does not feel well She will call her insurance to see if it is covered or if there is a co-pay and let me know Ultimately the ablation on 8/15 will help

## 2022-03-06 NOTE — Assessment & Plan Note (Addendum)
Chronic Heavy menses secondary to PCOS, fibroids and endometrial polyp Started metformin at her last visit-continue Following with GYN To have ablation 8/15 We will check CBC, CMP, iron panel

## 2022-03-06 NOTE — Assessment & Plan Note (Signed)
Chronic GERD controlled Continue pantoprazole 40 mg daily 

## 2022-03-06 NOTE — Assessment & Plan Note (Signed)
Chronic Check a1c Low sugar / carb diet Stressed regular exercise  

## 2022-03-11 ENCOUNTER — Telehealth: Payer: Self-pay | Admitting: Internal Medicine

## 2022-03-11 NOTE — Telephone Encounter (Signed)
[phone] Patient care center

## 2022-03-11 NOTE — Telephone Encounter (Signed)
Patient states that Dr. Quay Burow is wanting her to start iron infusions.  She called her insurance company to find out the cost and [employer] company told her that she would need a  CPT Code and hcpcs code - Please call patient back

## 2022-03-11 NOTE — Telephone Encounter (Signed)
Spoke with patient today and info given. 

## 2022-03-13 ENCOUNTER — Encounter (HOSPITAL_BASED_OUTPATIENT_CLINIC_OR_DEPARTMENT_OTHER): Payer: Self-pay | Admitting: Obstetrics and Gynecology

## 2022-03-13 NOTE — Progress Notes (Addendum)
Spoke w/ via phone for pre-op interview--- Brenda [name] Lab needs dos----   UPT, CBC and T&S per surgeon.             Lab results------ Current EKG in Epic dated 12/2021 COVID test -----patient states asymptomatic no test needed Arrive at -------1215 NPO after MN NO Solid Food.  Clear liquids from MN until---1115 Med rec completed Medications to take morning of surgery -----Albuterol-bring, Wellbutrin, Lexapro, Flonase and Protonix. Diabetic medication ----- None AM of surgery, patient verbalized understanding. Patient instructed no nail polish to be worn day of surgery Patient instructed to bring photo id and insurance card day of surgery Patient aware to have Driver (ride ) / caregiver  Mother Brenda [name]  for 24 hours after surgery  Patient Special Instructions ----- Pre-Op special Istructions ----- Patient verbalized understanding of instructions that were given at this phone interview. Patient denies shortness of breath, chest pain, fever, cough at this phone interview.

## 2022-03-17 DIAGNOSIS — Z0289 Encounter for other administrative examinations: Secondary | ICD-10-CM

## 2022-03-18 ENCOUNTER — Encounter (INDEPENDENT_AMBULATORY_CARE_PROVIDER_SITE_OTHER): Payer: Self-pay

## 2022-03-20 ENCOUNTER — Telehealth: Payer: Self-pay | Admitting: Internal Medicine

## 2022-03-20 ENCOUNTER — Other Ambulatory Visit: Payer: Self-pay | Admitting: Internal Medicine

## 2022-03-20 ENCOUNTER — Other Ambulatory Visit (HOSPITAL_COMMUNITY): Payer: Self-pay

## 2022-03-20 MED ORDER — LORATADINE 10 MG PO TABS
10.0000 mg | ORAL_TABLET | Freq: Every day | ORAL | 1 refills | Status: DC | PRN
Start: 2022-03-20 — End: 2023-10-26
  Filled 2022-03-20: qty 30, 30d supply, fill #0
  Filled 2022-06-07 – 2022-12-08 (×4): qty 30, 30d supply, fill #1

## 2022-03-20 MED ORDER — PANTOPRAZOLE SODIUM 40 MG PO TBEC
40.0000 mg | DELAYED_RELEASE_TABLET | Freq: Every day | ORAL | 1 refills | Status: DC
  Filled 2022-03-20: qty 30, 30d supply, fill #0
  Filled 2022-04-29 – 2022-06-07 (×2): qty 30, 30d supply, fill #1

## 2022-03-20 MED ORDER — METFORMIN HCL ER 500 MG PO TB24
500.0000 mg | ORAL_TABLET | Freq: Every day | ORAL | 1 refills | Status: DC
  Filled 2022-03-20: qty 30, 30d supply, fill #0
  Filled 2022-04-29 – 2022-08-07 (×3): qty 30, 30d supply, fill #1
  Filled 2022-11-16 – 2022-12-08 (×2): qty 30, 30d supply, fill #2

## 2022-03-20 NOTE — Telephone Encounter (Signed)
Copy refaxed today.

## 2022-03-20 NOTE — Telephone Encounter (Signed)
Pt called to give another fax number for her FLMA pw. The number we used before is not working, please re-fax the FLMA pw to:  [facility] FAX: [phone]  Forms are due 8.17.23

## 2022-03-23 ENCOUNTER — Other Ambulatory Visit (HOSPITAL_COMMUNITY): Payer: Self-pay

## 2022-03-23 ENCOUNTER — Other Ambulatory Visit: Payer: Self-pay

## 2022-03-24 ENCOUNTER — Ambulatory Visit (HOSPITAL_BASED_OUTPATIENT_CLINIC_OR_DEPARTMENT_OTHER): Admission: RE | Admit: 2022-03-24 | Payer: 59 | Source: Home / Self Care | Admitting: Obstetrics and Gynecology

## 2022-03-24 DIAGNOSIS — R7303 Prediabetes: Secondary | ICD-10-CM

## 2022-03-24 HISTORY — DX: Gastro-esophageal reflux disease without esophagitis: K21.9

## 2022-03-24 HISTORY — DX: Depression, unspecified: F32.A

## 2022-03-24 HISTORY — DX: Anxiety disorder, unspecified: F41.9

## 2022-03-24 SURGERY — DILATATION & CURETTAGE/HYSTEROSCOPY WITH NOVASURE ABLATION
Anesthesia: Choice

## 2022-03-27 ENCOUNTER — Non-Acute Institutional Stay (HOSPITAL_COMMUNITY)
Admission: RE | Admit: 2022-03-27 | Discharge: 2022-03-27 | Disposition: A | Discharge status: Home / Self Care | Payer: 59 | Source: Ambulatory Visit | Attending: Internal Medicine | Admitting: Internal Medicine

## 2022-03-27 DIAGNOSIS — D509 Iron deficiency anemia, unspecified: Secondary | ICD-10-CM | POA: Diagnosis present

## 2022-03-27 MED ORDER — SODIUM CHLORIDE 0.9 % IV SOLN
300.0000 mg | Freq: Once | INTRAVENOUS | Status: AC
  Administered 2022-03-27: 300 mg via INTRAVENOUS
  Filled 2022-03-27: qty 15

## 2022-03-27 MED ORDER — SODIUM CHLORIDE 0.9 % IV SOLN: INTRAVENOUS | Status: DC | PRN

## 2022-03-27 NOTE — Progress Notes (Signed)
PATIENT CARE CENTER NOTE   Diagnosis: Iron Deficiency Anemia    Provider: Scarlette Calico, MD   Procedure: Venofer 300 mg    Note:  Patient received Venofer 300 mg infusion (dose # 1 of 3) via PIV. No pre-medications ordered. Patient tolerated infusion well with no adverse reaction. Observed patient for 30 minutes post infusion. Vital signs stable. Discharge instructions given. Patient to come back next week for second infusion and will schedule appointment at the front desk. Alert, oriented and ambulatory at discharge.

## 2022-03-31 ENCOUNTER — Other Ambulatory Visit: Payer: Self-pay | Admitting: Obstetrics and Gynecology

## 2022-04-03 ENCOUNTER — Encounter (HOSPITAL_COMMUNITY): Payer: 59

## 2022-04-03 ENCOUNTER — Non-Acute Institutional Stay (HOSPITAL_COMMUNITY)
Admission: RE | Admit: 2022-04-03 | Discharge: 2022-04-03 | Disposition: A | Discharge status: Home / Self Care | Payer: 59 | Source: Ambulatory Visit | Attending: Internal Medicine | Admitting: Internal Medicine

## 2022-04-03 ENCOUNTER — Ambulatory Visit (HOSPITAL_COMMUNITY): Payer: 59

## 2022-04-03 DIAGNOSIS — D509 Iron deficiency anemia, unspecified: Secondary | ICD-10-CM | POA: Diagnosis present

## 2022-04-03 MED ORDER — SODIUM CHLORIDE 0.9 % IV SOLN
300.0000 mg | Freq: Once | INTRAVENOUS | Status: AC
  Administered 2022-04-03: 300 mg via INTRAVENOUS
  Filled 2022-04-03: qty 15

## 2022-04-03 MED ORDER — SODIUM CHLORIDE 0.9 % IV SOLN: INTRAVENOUS | Status: DC | PRN

## 2022-04-03 NOTE — Progress Notes (Signed)
PATIENT CARE CENTER NOTE     Diagnosis: Iron Deficiency Anemia      Provider: Scarlette Calico, MD     Procedure: Venofer 300 mg      Note:  Patient received Venofer 300 mg infusion (dose # 2 of 3) via PIV. No pre-medications ordered. Patient tolerated infusion well with no adverse reaction. Vital signs stable. AVS offered but patient refused. Patient to come back next week for final infusion and will schedule next appointment at the front desk. Alert, oriented and ambulatory at discharge.

## 2022-04-06 ENCOUNTER — Other Ambulatory Visit: Payer: Self-pay | Admitting: Internal Medicine

## 2022-04-07 ENCOUNTER — Telehealth: Payer: 59 | Admitting: Physician Assistant

## 2022-04-07 DIAGNOSIS — N840 Polyp of corpus uteri: Secondary | ICD-10-CM

## 2022-04-07 DIAGNOSIS — N939 Abnormal uterine and vaginal bleeding, unspecified: Secondary | ICD-10-CM | POA: Diagnosis not present

## 2022-04-07 DIAGNOSIS — N920 Excessive and frequent menstruation with regular cycle: Secondary | ICD-10-CM | POA: Diagnosis not present

## 2022-04-07 NOTE — Patient Instructions (Signed)
[name], thank you for joining [name], PA-C for today's virtual visit.  While this provider is not your primary care provider (PCP), if your PCP is located in our provider database this encounter information will be shared with them immediately following your visit.  Consent: (Patient) [name] provided verbal consent for this virtual visit at the beginning of the encounter.  Current Medications:  Current Outpatient Medications:    albuterol (VENTOLIN HFA) 108 (90 Base) MCG/ACT inhaler, Inhale 1-2 puffs into the lungs every 6 (six) hours as needed for wheezing or shortness of breath., Disp: 18 each, Rfl: 5   buPROPion (WELLBUTRIN XL) 150 MG 24 hr tablet, Take 1 tablet (150 mg total) by mouth daily., Disp: 90 tablet, Rfl: 1   escitalopram (LEXAPRO) 10 MG tablet, Take 1 tablet (10 mg total) by mouth daily., Disp: 90 tablet, Rfl: 1   fluticasone (FLONASE) 50 MCG/ACT nasal spray, Place 2 sprays into both nostrils daily., Disp: 16 g, Rfl: 6   loratadine (CLARITIN) 10 MG tablet, Take 1 tablet (10 mg total) by mouth daily as needed for allergies., Disp: 90 tablet, Rfl: 1   medroxyPROGESTERone (PROVERA) 10 MG tablet, TK 1 T PO Q 6 H UNTIL BLEEDING STOPS THEN 1 T DAILY AFTER PROCEDURE, Disp: , Rfl:    metFORMIN (GLUCOPHAGE) 500 MG tablet, Take 1 tablet (500 mg total) by mouth 2 (two) times daily with a meal., Disp: 180 tablet, Rfl: 3   metFORMIN (GLUCOPHAGE-XR) 500 MG 24 hr tablet, Take 1 tablet (500 mg total) by mouth daily., Disp: 90 tablet, Rfl: 1   methocarbamol (ROBAXIN) 500 MG tablet, Take 1 tablet (500 mg total) by mouth every 6 (six) hours as needed for muscle spasms., Disp: 30 tablet, Rfl: 0   montelukast (SINGULAIR) 10 MG tablet, TAKE 1 TABLET BY MOUTH EVERYDAY AT BEDTIME, Disp: 90 tablet, Rfl: 1   Olopatadine HCl 0.2 % SOLN, Apply 1 drop to eye in the morning and at bedtime., Disp: 2.5 mL, Rfl: 5   pantoprazole (PROTONIX) 40 MG tablet, Take 1 tablet (40 mg total) by  mouth daily., Disp: 90 tablet, Rfl: 1   tranexamic acid (LYSTEDA) 650 MG TABS tablet, Take 1,300 mg by mouth every 8 (eight) hours as needed., Disp: , Rfl:    Medications ordered in this encounter:  No orders of the defined types were placed in this encounter.   *If you need refills on other medications prior to your next appointment, please contact your pharmacy*  Follow-Up: Call back or seek an in-person evaluation if the symptoms worsen or if the condition fails to improve as anticipated.  Other Instructions Please use link below to get evaluated in person today as discussed. Any worsening symptoms -- ER. DO NOT DELAY CARE  If you have been instructed to have an in-person evaluation today at a local Urgent Care facility, please use the link below. It will take you to a list of all of our available [facility], including address, phone number and hours of operation. Please do not delay care.  [facility]  If you or a family member do not have a primary care provider, use the link below to schedule a visit and establish care. When you choose a [facility] primary care physician or advanced practice provider, you gain a long-term partner in health. Find a Primary Care Provider  Learn more about [facility] in-office and virtual care options: [facility]

## 2022-04-07 NOTE — Progress Notes (Signed)
Virtual Visit Consent   [name], you are scheduled for a virtual visit with a [facility] provider today. Just as with appointments in the office, your consent must be obtained to participate. Your consent will be active for this visit and any virtual visit you may have with one of our providers in the next 365 days. If you have a MyChart account, a copy of this consent can be sent to you electronically.  As this is a virtual visit, video technology does not allow for your provider to perform a traditional examination. This may limit your provider's ability to fully assess your condition. If your provider identifies any concerns that need to be evaluated in person or the need to arrange testing (such as labs, EKG, etc.), we will make arrangements to do so. Although advances in technology are sophisticated, we cannot ensure that it will always work on either your end or our end. If the connection with a video visit is poor, the visit may have to be switched to a telephone visit. With either a video or telephone visit, we are not always able to ensure that we have a secure connection.  By engaging in this virtual visit, you consent to the provision of healthcare and authorize for your insurance to be billed (if applicable) for the services provided during this visit. Depending on your insurance coverage, you may receive a charge related to this service.  I need to obtain your verbal consent now. Are you willing to proceed with your visit today? Brenda [name] has provided verbal consent on 04/07/2022 for a virtual visit (video or telephone). Brenda [name], [place]  Date: 04/07/2022 2:50 PM  Virtual Visit via Video Note   I, Brenda [name], connected with  Brenda [name]  ([id number], [birth year]) on 04/07/22 at  2:30 PM EDT by a video-enabled telemedicine application and verified that I am speaking with the correct person using two identifiers.  Location: Patient: Virtual Visit  Location Patient: Home Provider: Virtual Visit Location Provider: Home Office   I discussed the limitations of evaluation and management by telemedicine and the availability of in person appointments. The patient expressed understanding and agreed to proceed.    History of Present Illness: Brenda [name] is a 36 y.o. who identifies as a female who was assigned female at birth, and is being seen today for continued vaginal bleeding over the past several weeks. Has history of endometrial polyps with ongoing bleeding and anemia, requiring iron infusions. Notes feeling more fatigued than usual and now with shortness of breath and lightheadedness. Was unable to work today due to fatigue. Tried to get in touch with her GYN but was informed she was out of office this week, no other provider could see her, and to keep her preop appt with her GYN for next week.  HPI: HPI  Problems:  Patient Active Problem List   Diagnosis Date Noted   GERD (gastroesophageal reflux disease) 03/06/2022   Weight gain 09/01/2021   Dizziness 06/05/2021   Urinary frequency 06/05/2021   PCOS (polycystic ovarian syndrome) 02/12/2021   Iron deficiency anemia 02/12/2021   Dysthymia 02/12/2021   Shingles 01/29/2021   Fatigue 12/16/2020   Impaired attention 12/16/2020   Anxiety 11/04/2020   Acute midline low back pain with bilateral sciatica 09/18/2020   Allergic rhinitis 02/01/2017   Genital herpes simplex 02/01/2017   Prediabetes 11/24/2016   Vitamin D deficiency 11/24/2016   S/P primary low transverse C-section 02/28/2016   Obesity  with body mass index 30 or greater 08/31/2015   Seasonal asthma 08/06/2011   Morbid obesity ([place]) 08/06/2011   Fibroids 08/06/2011    Allergies:  Allergies  Allergen Reactions   Lexapro [Escitalopram] Other (See Comments)    dizziness   Sertraline Other (See Comments)    Made her cry all the time   Medications:  Current Outpatient Medications:    albuterol (VENTOLIN HFA) 108  (90 Base) MCG/ACT inhaler, Inhale 1-2 puffs into the lungs every 6 (six) hours as needed for wheezing or shortness of breath., Disp: 18 each, Rfl: 5   buPROPion (WELLBUTRIN XL) 150 MG 24 hr tablet, Take 1 tablet (150 mg total) by mouth daily., Disp: 90 tablet, Rfl: 1   escitalopram (LEXAPRO) 10 MG tablet, Take 1 tablet (10 mg total) by mouth daily., Disp: 90 tablet, Rfl: 1   fluticasone (FLONASE) 50 MCG/ACT nasal spray, Place 2 sprays into both nostrils daily., Disp: 16 g, Rfl: 6   loratadine (CLARITIN) 10 MG tablet, Take 1 tablet (10 mg total) by mouth daily as needed for allergies., Disp: 90 tablet, Rfl: 1   medroxyPROGESTERone (PROVERA) 10 MG tablet, TK 1 T PO Q 6 H UNTIL BLEEDING STOPS THEN 1 T DAILY AFTER PROCEDURE, Disp: , Rfl:    metFORMIN (GLUCOPHAGE) 500 MG tablet, Take 1 tablet (500 mg total) by mouth 2 (two) times daily with a meal., Disp: 180 tablet, Rfl: 3   metFORMIN (GLUCOPHAGE-XR) 500 MG 24 hr tablet, Take 1 tablet (500 mg total) by mouth daily., Disp: 90 tablet, Rfl: 1   methocarbamol (ROBAXIN) 500 MG tablet, Take 1 tablet (500 mg total) by mouth every 6 (six) hours as needed for muscle spasms., Disp: 30 tablet, Rfl: 0   montelukast (SINGULAIR) 10 MG tablet, TAKE 1 TABLET BY MOUTH EVERYDAY AT BEDTIME, Disp: 90 tablet, Rfl: 1   Olopatadine HCl 0.2 % SOLN, Apply 1 drop to eye in the morning and at bedtime., Disp: 2.5 mL, Rfl: 5   pantoprazole (PROTONIX) 40 MG tablet, Take 1 tablet (40 mg total) by mouth daily., Disp: 90 tablet, Rfl: 1   tranexamic acid (LYSTEDA) 650 MG TABS tablet, Take 1,300 mg by mouth every 8 (eight) hours as needed., Disp: , Rfl:   Observations/Objective: Patient is well-developed, well-nourished in no acute distress.  Resting comfortably at home.  Head is normocephalic, atraumatic.  No labored breathing. Speech is clear and coherent with logical content.  Patient is alert and oriented at baseline.   Assessment and Plan: 1. Menorrhagia with regular  cycle  2. Abnormal uterine bleeding due to endometrial polyp  Needs in person evaluation for repeat exam and hgb assessment. She agrees to go be evaluated this afternoon.   Follow Up Instructions: I discussed the assessment and treatment plan with the patient. The patient was provided an opportunity to ask questions and all were answered. The patient agreed with the plan and demonstrated an understanding of the instructions.  A copy of instructions were sent to the patient via MyChart unless otherwise noted below.   The patient was advised to call back or seek an in-person evaluation if the symptoms worsen or if the condition fails to improve as anticipated.  Time:  I spent 10 minutes with the patient via telehealth technology discussing the above problems/concerns.    Brenda Rio, PA-C

## 2022-04-09 ENCOUNTER — Encounter (HOSPITAL_COMMUNITY): Payer: 59

## 2022-04-09 ENCOUNTER — Encounter: Payer: Self-pay | Admitting: Internal Medicine

## 2022-04-09 DIAGNOSIS — N920 Excessive and frequent menstruation with regular cycle: Secondary | ICD-10-CM | POA: Insufficient documentation

## 2022-04-09 NOTE — Progress Notes (Deleted)
    Subjective:    Patient ID: SWAN FAIRFAX, female    DOB: Apr 14, 1986, 36 y.o.   MRN: [id number]      HPI Brenda [name] is here for No chief complaint on file.   Heavy menstrual bleeding - has D & C for 9/13.   Has continued to have heavy menses and is feeling lightheaded and SOB.       Medications and allergies reviewed with patient and updated if appropriate.  Current Outpatient Medications on File Prior to Visit  Medication Sig Dispense Refill   albuterol (VENTOLIN HFA) 108 (90 Base) MCG/ACT inhaler Inhale 1-2 puffs into the lungs every 6 (six) hours as needed for wheezing or shortness of breath. 18 each 5   buPROPion (WELLBUTRIN XL) 150 MG 24 hr tablet Take 1 tablet (150 mg total) by mouth daily. 90 tablet 1   escitalopram (LEXAPRO) 10 MG tablet Take 1 tablet (10 mg total) by mouth daily. 90 tablet 1   fluticasone (FLONASE) 50 MCG/ACT nasal spray Place 2 sprays into both nostrils daily. 16 g 6   loratadine (CLARITIN) 10 MG tablet Take 1 tablet (10 mg total) by mouth daily as needed for allergies. 90 tablet 1   medroxyPROGESTERone (PROVERA) 10 MG tablet TK 1 T PO Q 6 H UNTIL BLEEDING STOPS THEN 1 T DAILY AFTER PROCEDURE     metFORMIN (GLUCOPHAGE) 500 MG tablet Take 1 tablet (500 mg total) by mouth 2 (two) times daily with a meal. 180 tablet 3   metFORMIN (GLUCOPHAGE-XR) 500 MG 24 hr tablet Take 1 tablet (500 mg total) by mouth daily. 90 tablet 1   methocarbamol (ROBAXIN) 500 MG tablet Take 1 tablet (500 mg total) by mouth every 6 (six) hours as needed for muscle spasms. 30 tablet 0   montelukast (SINGULAIR) 10 MG tablet TAKE 1 TABLET BY MOUTH EVERYDAY AT BEDTIME 90 tablet 1   Olopatadine HCl 0.2 % SOLN Apply 1 drop to eye in the morning and at bedtime. 2.5 mL 5   pantoprazole (PROTONIX) 40 MG tablet Take 1 tablet (40 mg total) by mouth daily. 90 tablet 1   tranexamic acid (LYSTEDA) 650 MG TABS tablet Take 1,300 mg by mouth every 8 (eight) hours as needed.     No current  facility-administered medications on file prior to visit.    Review of Systems     Objective:  There were no vitals filed for this visit. BP Readings from Last 3 Encounters:  04/03/22 125/83  03/27/22 119/83  03/06/22 130/74   Wt Readings from Last 3 Encounters:  03/06/22 254 lb (115.2 kg)  12/31/21 255 lb (115.7 kg)  09/01/21 257 lb 9.6 oz (116.8 kg)   There is no height or weight on file to calculate BMI.    Physical Exam         Assessment & Plan:    See Problem List for Assessment and Plan of chronic medical problems.

## 2022-04-10 ENCOUNTER — Ambulatory Visit: Payer: 59 | Admitting: Internal Medicine

## 2022-04-10 DIAGNOSIS — D500 Iron deficiency anemia secondary to blood loss (chronic): Secondary | ICD-10-CM

## 2022-04-10 DIAGNOSIS — N920 Excessive and frequent menstruation with regular cycle: Secondary | ICD-10-CM

## 2022-04-15 ENCOUNTER — Encounter (HOSPITAL_COMMUNITY): Payer: 59

## 2022-04-20 ENCOUNTER — Other Ambulatory Visit: Payer: Self-pay

## 2022-04-20 ENCOUNTER — Encounter (HOSPITAL_COMMUNITY): Payer: Self-pay | Admitting: Obstetrics and Gynecology

## 2022-04-20 NOTE — Progress Notes (Signed)
PCP - Binnie Rail, MD  Chest x-ray - 12/31/21 EKG - 01/02/22  ERAS Protcol - NPO  Anesthesia review: N  Patient verbally denies any shortness of breath, fever, cough and chest pain during phone call   -------------  SDW INSTRUCTIONS given:  Your procedure is scheduled on 04/24/22.  Report to [facility] Main Entrance "A" at 1030 A.M., and check in at the Admitting office.  Call this number if you have problems the morning of surgery:  [phone]   Remember:  Do not eat or drink after midnight the night before your surgery    Take these medicines the morning of surgery with A SIP OF WATER  buPROPion (WELLBUTRIN XL) escitalopram (LEXAPRO) loratadine (CLARITIN) pantoprazole (PROTONIX) methocarbamol (ROBAXIN) albuterol (VENTOLIN HFA)-if needed (Please bring on the day of surgery)   As of today, STOP taking any Aspirin (unless otherwise instructed by your surgeon) Aleve, Naproxen, Ibuprofen, Motrin, Advil, Goody's, BC's, all herbal medications, fish oil, and all vitamins.                      Do not wear jewelry, make up, or nail polish            Do not wear lotions, powders, perfumes/colognes, or deodorant.            Do not shave 48 hours prior to surgery.  Men may shave face and neck.            Do not bring valuables to the hospital.            [facility] is not responsible for any belongings or valuables.  Do NOT Smoke (Tobacco/Vaping) 24 hours prior to your procedure If you use a CPAP at night, you may bring all equipment for your overnight stay.   Contacts, glasses, dentures or bridgework may not be worn into surgery.      For patients admitted to the hospital, discharge time will be determined by your treatment team.   Patients discharged the day of surgery will not be allowed to drive home, and someone needs to stay with them for 24 hours.    Special instructions:   [place]- Preparing For Surgery  Before surgery, you can play an important role.  Because skin is not sterile, your skin needs to be as free of germs as possible. You can reduce the number of germs on your skin by washing with CHG (chlorahexidine gluconate) Soap before surgery.  CHG is an antiseptic cleaner which kills germs and bonds with the skin to continue killing germs even after washing.    Oral Hygiene is also important to reduce your risk of infection.  Remember - BRUSH YOUR TEETH THE MORNING OF SURGERY WITH YOUR REGULAR TOOTHPASTE  Please do not use if you have an allergy to CHG or antibacterial soaps. If your skin becomes reddened/irritated stop using the CHG.  Do not shave (including legs and underarms) for at least 48 hours prior to first CHG shower. It is OK to shave your face.  Please follow these instructions carefully.   Shower the NIGHT BEFORE SURGERY and the MORNING OF SURGERY with DIAL Soap.   Pat yourself dry with a CLEAN TOWEL.  Wear CLEAN PAJAMAS to bed the night before surgery  Place CLEAN SHEETS on your bed the night of your first shower and DO NOT SLEEP WITH PETS.   Day of Surgery: Please shower morning of surgery  Wear Clean/Comfortable clothing the morning of surgery  Do not apply any deodorants/lotions.   Remember to brush your teeth WITH YOUR REGULAR TOOTHPASTE.   Questions were answered. Patient verbalized understanding of instructions.

## 2022-04-21 ENCOUNTER — Non-Acute Institutional Stay (HOSPITAL_COMMUNITY)
Admission: RE | Admit: 2022-04-21 | Discharge: 2022-04-21 | Disposition: A | Discharge status: Home / Self Care | Payer: 59 | Source: Ambulatory Visit | Attending: Internal Medicine | Admitting: Internal Medicine

## 2022-04-21 DIAGNOSIS — D509 Iron deficiency anemia, unspecified: Secondary | ICD-10-CM | POA: Diagnosis present

## 2022-04-21 MED ORDER — SODIUM CHLORIDE 0.9 % IV SOLN: INTRAVENOUS | Status: DC | PRN

## 2022-04-21 MED ORDER — SODIUM CHLORIDE 0.9 % IV SOLN
300.0000 mg | Freq: Once | INTRAVENOUS | Status: AC
  Administered 2022-04-21: 300 mg via INTRAVENOUS
  Filled 2022-04-21: qty 15

## 2022-04-21 NOTE — Progress Notes (Signed)
PATIENT CARE CENTER NOTE     Diagnosis: Iron Deficiency Anemia      Provider: Scarlette Calico, MD     Procedure: Venofer 300 mg      Note:  Patient received Venofer 300 mg infusion (dose # 3 of 3) via PIV. No pre-medications ordered. Patient tolerated infusion well with no adverse reaction. Vital signs stable. Discharge instructions given. Alert, oriented and ambulatory at discharge.

## 2022-04-24 ENCOUNTER — Ambulatory Visit (HOSPITAL_BASED_OUTPATIENT_CLINIC_OR_DEPARTMENT_OTHER): Payer: 59 | Admitting: Anesthesiology

## 2022-04-24 ENCOUNTER — Encounter (HOSPITAL_COMMUNITY): Payer: Self-pay | Admitting: Obstetrics and Gynecology

## 2022-04-24 ENCOUNTER — Ambulatory Visit (HOSPITAL_COMMUNITY): Payer: 59 | Admitting: Anesthesiology

## 2022-04-24 ENCOUNTER — Other Ambulatory Visit: Payer: Self-pay

## 2022-04-24 ENCOUNTER — Encounter (HOSPITAL_COMMUNITY)
Admission: RE | Disposition: A | Discharge status: Home / Self Care | Payer: Self-pay | Source: Home / Self Care | Attending: Obstetrics and Gynecology

## 2022-04-24 ENCOUNTER — Ambulatory Visit (HOSPITAL_COMMUNITY)
Admission: RE | Admit: 2022-04-24 | Discharge: 2022-04-24 | Disposition: A | Discharge status: Home / Self Care | Payer: 59 | Attending: Obstetrics and Gynecology | Admitting: Obstetrics and Gynecology

## 2022-04-24 DIAGNOSIS — N920 Excessive and frequent menstruation with regular cycle: Secondary | ICD-10-CM | POA: Insufficient documentation

## 2022-04-24 DIAGNOSIS — J45909 Unspecified asthma, uncomplicated: Secondary | ICD-10-CM

## 2022-04-24 DIAGNOSIS — E119 Type 2 diabetes mellitus without complications: Secondary | ICD-10-CM | POA: Insufficient documentation

## 2022-04-24 DIAGNOSIS — Z7984 Long term (current) use of oral hypoglycemic drugs: Secondary | ICD-10-CM

## 2022-04-24 DIAGNOSIS — K219 Gastro-esophageal reflux disease without esophagitis: Secondary | ICD-10-CM | POA: Insufficient documentation

## 2022-04-24 DIAGNOSIS — D759 Disease of blood and blood-forming organs, unspecified: Secondary | ICD-10-CM | POA: Insufficient documentation

## 2022-04-24 DIAGNOSIS — D649 Anemia, unspecified: Secondary | ICD-10-CM | POA: Insufficient documentation

## 2022-04-24 DIAGNOSIS — N840 Polyp of corpus uteri: Secondary | ICD-10-CM | POA: Insufficient documentation

## 2022-04-24 HISTORY — DX: Gestational diabetes mellitus in pregnancy, unspecified control: O24.419

## 2022-04-24 HISTORY — DX: Anemia, unspecified: D64.9

## 2022-04-24 HISTORY — DX: Prediabetes: R73.03

## 2022-04-24 HISTORY — PX: DILITATION & CURRETTAGE/HYSTROSCOPY WITH NOVASURE ABLATION: SHX5568

## 2022-04-24 HISTORY — PX: DILATATION & CURETTAGE/HYSTEROSCOPY WITH MYOSURE: SHX6511

## 2022-04-24 LAB — CBC
HCT: 34.4 % — ABNORMAL LOW (ref 36.0–46.0)
Hemoglobin: 10.1 g/dL — ABNORMAL LOW (ref 12.0–15.0)
MCH: 21.1 pg — ABNORMAL LOW (ref 26.0–34.0)
MCHC: 29.4 g/dL — ABNORMAL LOW (ref 30.0–36.0)
MCV: 71.8 fL — ABNORMAL LOW (ref 80.0–100.0)
Platelets: 349 10*3/uL (ref 150–400)
RBC: 4.79 MIL/uL (ref 3.87–5.11)
RDW: 23.9 % — ABNORMAL HIGH (ref 11.5–15.5)
WBC: 3 10*3/uL — ABNORMAL LOW (ref 4.0–10.5)
nRBC: 0 % (ref 0.0–0.2)

## 2022-04-24 LAB — BASIC METABOLIC PANEL
Anion gap: 11 (ref 5–15)
BUN: 15 mg/dL (ref 6–20)
CO2: 23 mmol/L (ref 22–32)
Calcium: 9.1 mg/dL (ref 8.9–10.3)
Chloride: 107 mmol/L (ref 98–111)
Creatinine, Ser: 0.92 mg/dL (ref 0.44–1.00)
GFR, Estimated: >60 mL/min (ref >60)
Glucose, Bld: 89 mg/dL (ref 70–99)
Potassium: 3.8 mmol/L (ref 3.5–5.1)
Sodium: 141 mmol/L (ref 135–145)

## 2022-04-24 LAB — GLUCOSE, CAPILLARY: Glucose-Capillary: 69 mg/dL — ABNORMAL LOW (ref 70–99)

## 2022-04-24 LAB — POCT PREGNANCY, URINE: Preg Test, Ur: NEGATIVE

## 2022-04-24 SURGERY — DILATATION & CURETTAGE/HYSTEROSCOPY WITH NOVASURE ABLATION
Anesthesia: General

## 2022-04-24 MED ORDER — DEXAMETHASONE SODIUM PHOSPHATE 10 MG/ML IJ SOLN
INTRAMUSCULAR | Status: DC | PRN
  Administered 2022-04-24: 10 mg via INTRAVENOUS

## 2022-04-24 MED ORDER — IBUPROFEN 600 MG PO TABS: 600.0000 mg | ORAL_TABLET | Freq: Four times a day (QID) | ORAL | 1 refills | Status: DC | PRN

## 2022-04-24 MED ORDER — FENTANYL CITRATE (PF) 100 MCG/2ML IJ SOLN
INTRAMUSCULAR | Status: DC | PRN
  Administered 2022-04-24 (×2): 50 ug via INTRAVENOUS
  Administered 2022-04-24: 25 ug via INTRAVENOUS
  Administered 2022-04-24 (×2): 50 ug via INTRAVENOUS
  Administered 2022-04-24: 25 ug via INTRAVENOUS

## 2022-04-24 MED ORDER — AMISULPRIDE (ANTIEMETIC) 5 MG/2ML IV SOLN: 10.0000 mg | Freq: Once | INTRAVENOUS | Status: DC | PRN

## 2022-04-24 MED ORDER — MIDAZOLAM HCL 2 MG/2ML IJ SOLN
INTRAMUSCULAR | Status: AC
  Filled 2022-04-24: qty 2

## 2022-04-24 MED ORDER — LIDOCAINE 2% (20 MG/ML) 5 ML SYRINGE
INTRAMUSCULAR | Status: DC | PRN
  Administered 2022-04-24: 100 mg via INTRAVENOUS

## 2022-04-24 MED ORDER — KETOROLAC TROMETHAMINE 30 MG/ML IJ SOLN
INTRAMUSCULAR | Status: DC | PRN
  Administered 2022-04-24: 30 mg via INTRAVENOUS

## 2022-04-24 MED ORDER — FENTANYL CITRATE (PF) 250 MCG/5ML IJ SOLN
INTRAMUSCULAR | Status: AC
  Filled 2022-04-24: qty 5

## 2022-04-24 MED ORDER — SILVER NITRATE-POT NITRATE 75-25 % EX MISC
CUTANEOUS | Status: AC
  Filled 2022-04-24: qty 10

## 2022-04-24 MED ORDER — PROPOFOL 10 MG/ML IV BOLUS
INTRAVENOUS | Status: DC | PRN
  Administered 2022-04-24: 200 mg via INTRAVENOUS

## 2022-04-24 MED ORDER — SODIUM CHLORIDE 0.9 % IR SOLN
Status: DC | PRN
  Administered 2022-04-24: 3000 mL via INTRAVESICAL

## 2022-04-24 MED ORDER — ACETAMINOPHEN 500 MG PO TABS
1000.0000 mg | ORAL_TABLET | Freq: Once | ORAL | Status: AC
  Administered 2022-04-24: 1000 mg via ORAL
  Filled 2022-04-24: qty 2

## 2022-04-24 MED ORDER — ONDANSETRON HCL 4 MG/2ML IJ SOLN
INTRAMUSCULAR | Status: AC
  Filled 2022-04-24: qty 2

## 2022-04-24 MED ORDER — DEXAMETHASONE SODIUM PHOSPHATE 10 MG/ML IJ SOLN
INTRAMUSCULAR | Status: AC
  Filled 2022-04-24: qty 1

## 2022-04-24 MED ORDER — LIDOCAINE HCL 1 % IJ SOLN
INTRAMUSCULAR | Status: DC | PRN
  Administered 2022-04-24: 10 mL

## 2022-04-24 MED ORDER — LACTATED RINGERS IV SOLN: INTRAVENOUS | Status: DC

## 2022-04-24 MED ORDER — CHLORHEXIDINE GLUCONATE 0.12 % MT SOLN
15.0000 mL | Freq: Once | OROMUCOSAL | Status: AC
  Administered 2022-04-24: 15 mL via OROMUCOSAL
  Filled 2022-04-24: qty 15

## 2022-04-24 MED ORDER — MIDAZOLAM HCL 5 MG/5ML IJ SOLN
INTRAMUSCULAR | Status: DC | PRN
  Administered 2022-04-24: 2 mg via INTRAVENOUS

## 2022-04-24 MED ORDER — ONDANSETRON HCL 4 MG/2ML IJ SOLN
INTRAMUSCULAR | Status: DC | PRN
  Administered 2022-04-24: 4 mg via INTRAVENOUS

## 2022-04-24 MED ORDER — ORAL CARE MOUTH RINSE: 15.0000 mL | Freq: Once | OROMUCOSAL | Status: AC

## 2022-04-24 MED ORDER — LIDOCAINE HCL (PF) 1 % IJ SOLN
INTRAMUSCULAR | Status: AC
  Filled 2022-04-24: qty 30

## 2022-04-24 MED ORDER — OXYCODONE HCL 5 MG PO TABS: 5.0000 mg | ORAL_TABLET | ORAL | 0 refills | Status: DC | PRN

## 2022-04-24 MED ORDER — KETOROLAC TROMETHAMINE 30 MG/ML IJ SOLN
INTRAMUSCULAR | Status: AC
  Filled 2022-04-24: qty 1

## 2022-04-24 MED ORDER — LIDOCAINE 2% (20 MG/ML) 5 ML SYRINGE
INTRAMUSCULAR | Status: AC
  Filled 2022-04-24: qty 5

## 2022-04-24 MED ORDER — PROPOFOL 10 MG/ML IV BOLUS
INTRAVENOUS | Status: AC
  Filled 2022-04-24: qty 20

## 2022-04-24 MED ORDER — POVIDONE-IODINE 10 % EX SWAB
2.0000 | Freq: Once | CUTANEOUS | Status: AC
  Administered 2022-04-24: 2 via TOPICAL

## 2022-04-24 MED ORDER — FENTANYL CITRATE (PF) 100 MCG/2ML IJ SOLN: 25.0000 ug | INTRAMUSCULAR | Status: DC | PRN

## 2022-04-24 SURGICAL SUPPLY — 18 items
ABLATOR SURESOUND NOVASURE (ABLATOR) ×2 IMPLANT
CANISTER SUCT 3000ML PPV (MISCELLANEOUS) ×2 IMPLANT
CATH ROBINSON RED A/P 16FR (CATHETERS) ×2 IMPLANT
DEVICE MYOSURE LITE (MISCELLANEOUS) IMPLANT
DEVICE MYOSURE REACH (MISCELLANEOUS) IMPLANT
DILATOR CANAL MILEX (MISCELLANEOUS) IMPLANT
GLOVE BIO SURGEON STRL SZ7.5 (GLOVE) ×2 IMPLANT
GLOVE BIOGEL PI IND STRL 7.5 (GLOVE) ×2 IMPLANT
GLOVE SURG UNDER POLY LF SZ7 (GLOVE) ×2 IMPLANT
GOWN STRL REUS W/ TWL LRG LVL3 (GOWN DISPOSABLE) ×4 IMPLANT
GOWN STRL REUS W/TWL LRG LVL3 (GOWN DISPOSABLE) ×2
KIT PROCEDURE FLUENT (KITS) ×2 IMPLANT
KIT TURNOVER KIT B (KITS) ×2 IMPLANT
PACK VAGINAL MINOR WOMEN LF (CUSTOM PROCEDURE TRAY) ×2 IMPLANT
PAD OB MATERNITY 4.3X12.25 (PERSONAL CARE ITEMS) ×2 IMPLANT
SEAL ROD LENS SCOPE MYOSURE (ABLATOR) ×2 IMPLANT
TOWEL GREEN STERILE FF (TOWEL DISPOSABLE) ×4 IMPLANT
UNDERPAD 30X36 HEAVY ABSORB (UNDERPADS AND DIAPERS) ×2 IMPLANT

## 2022-04-24 NOTE — H&P (Signed)
Brenda [name] is an 36 y.o. female. Pt presents for scheduled hysteroscopy D&C Ablation possible myosure  Pertinent Gynecological History: H/o regular havy cycles  Menstrual History: LMP bleeding now    Past Medical History:  Diagnosis Date   Abnormal Pap smear 2007   colpo   Anemia    Anxiety    Asthma    childhood - PRN inhaler   Depression    Fibroid    4 fibroids    GERD (gastroesophageal reflux disease)    Gestational diabetes mellitus    H/O chlamydia infection 2010   H/O varicella    Mass of neck 03/02/2014   Obese    PCOS (polycystic ovarian syndrome)    Pre-diabetes    Rubella non-immune status 01/11/2012   Vaginal Pap smear, abnormal    biopsy - normal     Past Surgical History:  Procedure Laterality Date   CESAREAN SECTION N/A 02/28/2016   Procedure: CESAREAN SECTION;  Surgeon: [name], MD;  Location: [place];  Service: Obstetrics;  Laterality: N/A;   CESAREAN SECTION WITH BILATERAL TUBAL LIGATION Bilateral 09/29/2018   Procedure: REPEAT CESAREAN SECTION WITH BILATERAL TUBAL LIGATION;  Surgeon: [name], MD;  Location: [place];  Service: Obstetrics;  Laterality: Bilateral;    Family History  Problem Relation Age of Onset   Hypertension Mother        diet controlled no meds   Hypertension Father    Alcohol abuse Father    Heart disease Maternal Aunt    Mental illness Maternal Aunt        bipolar   Diabetes Maternal Aunt    Heart disease Maternal Grandmother    Hypertension Maternal Grandmother    Thrombophlebitis Maternal Grandmother    Diabetes Maternal Grandmother    Stroke Maternal Grandmother    Hypertension Paternal Grandmother    Heart disease Paternal Grandmother    Asthma Cousin    Cancer Cousin    Heart disease Cousin        congenital heart disease   Learning disabilities Cousin    Anesthesia problems Neg Hx     Social History:  reports that she has never smoked. She has never used smokeless  tobacco. She reports current alcohol use. She reports that she does not use drugs.  Allergies:  Allergies  Allergen Reactions   Lexapro [Escitalopram] Other (See Comments)    dizziness   Sertraline Other (See Comments)    Made her cry all the time    Medications Prior to Admission  Medication Sig Dispense Refill Last Dose   buPROPion (WELLBUTRIN XL) 150 MG 24 hr tablet Take 1 tablet (150 mg total) by mouth daily. 90 tablet 1 Past Month   escitalopram (LEXAPRO) 10 MG tablet Take 1 tablet (10 mg total) by mouth daily. 90 tablet 1 Past Month   loratadine (CLARITIN) 10 MG tablet Take 1 tablet (10 mg total) by mouth daily as needed for allergies. (Patient taking differently: Take 10 mg by mouth daily.) 90 tablet 1 04/24/2022 at 0600   medroxyPROGESTERone (PROVERA) 10 MG tablet Take 10 mg by mouth daily.   04/24/2022 at 0600   metFORMIN (GLUCOPHAGE-XR) 500 MG 24 hr tablet Take 1 tablet (500 mg total) by mouth daily. 90 tablet 1 04/23/2022   methocarbamol (ROBAXIN) 500 MG tablet Take 1 tablet (500 mg total) by mouth every 6 (six) hours as needed for muscle spasms. 30 tablet 0 Past Week   pantoprazole (PROTONIX) 40 MG tablet Take 1  tablet (40 mg total) by mouth daily. 90 tablet 1 04/24/2022 at 0600   albuterol (VENTOLIN HFA) 108 (90 Base) MCG/ACT inhaler Inhale 1-2 puffs into the lungs every 6 (six) hours as needed for wheezing or shortness of breath. (Patient taking differently: Inhale 1-2 puffs into the lungs every 6 (six) hours as needed (Asthma).) 18 each 5 More than a month   fluticasone (FLONASE) 50 MCG/ACT nasal spray Place 2 sprays into both nostrils daily. (Patient taking differently: Place 2 sprays into both nostrils daily as needed for allergies.) 16 g 6 More than a month   metFORMIN (GLUCOPHAGE) 500 MG tablet Take 1 tablet (500 mg total) by mouth 2 (two) times daily with a meal. (Patient taking differently: Take 500 mg by mouth daily with breakfast.) 180 tablet 3    montelukast (SINGULAIR) 10  MG tablet TAKE 1 TABLET BY MOUTH EVERYDAY AT BEDTIME (Patient taking differently: Take 10 mg by mouth daily as needed (allergies).) 90 tablet 1 More than a month    Review of Systems Denies F/C/N/V/D  Blood pressure (!) 141/97, pulse 99, temperature 98.2 F (36.8 C), temperature source Oral, resp. rate 18, height 5' 3.5" (1.613 m), weight 114.8 kg, SpO2 100 %, unknown if currently breastfeeding. Physical Exam Lungs CTA CV RRR Abdomen soft, NT Ext no calf tenderness  Results for orders placed or performed during the hospital encounter of 04/24/22 (from the past 24 hour(s))  Glucose, capillary     Status: Abnormal   Collection Time: 04/24/22 10:51 AM  Result Value Ref Range   Glucose-Capillary 69 (L) 70 - 99 mg/dL  CBC per protocol     Status: Abnormal   Collection Time: 04/24/22 11:24 AM  Result Value Ref Range   WBC 3.0 (L) 4.0 - 10.5 K/uL   RBC 4.79 3.87 - 5.11 MIL/uL   Hemoglobin 10.1 (L) 12.0 - 15.0 g/dL   HCT 34.4 (L) 36.0 - 46.0 %   MCV 71.8 (L) 80.0 - 100.0 fL   MCH 21.1 (L) 26.0 - 34.0 pg   MCHC 29.4 (L) 30.0 - 36.0 g/dL   RDW 23.9 (H) 11.5 - 15.5 %   Platelets 349 150 - 400 K/uL   nRBC 0.0 0.0 - 0.2 %  Basic metabolic panel per protocol     Status: None   Collection Time: 04/24/22 11:24 AM  Result Value Ref Range   Sodium 141 135 - 145 mmol/L   Potassium 3.8 3.5 - 5.1 mmol/L   Chloride 107 98 - 111 mmol/L   CO2 23 22 - 32 mmol/L   Glucose, Bld 89 70 - 99 mg/dL   BUN 15 6 - 20 mg/dL   Creatinine, Ser 0.92 0.44 - 1.00 mg/dL   Calcium 9.1 8.9 - 10.3 mg/dL   GFR, Estimated >60 >60 mL/min   Anion gap 11 5 - 15  Pregnancy, urine POC     Status: None   Collection Time: 04/24/22 11:54 AM  Result Value Ref Range   Preg Test, Ur NEGATIVE NEGATIVE    No results found.  Assessment/Plan: P3 with menorrhagia d/t symptomatic fibroids.  Risks benefits alternatives discussed with the patient including but not limited to bleeding infection and injury, questions answered  and consent signed and witnessed.  Delice Lesch 04/24/2022, 1:17 PM

## 2022-04-24 NOTE — Anesthesia Postprocedure Evaluation (Signed)
Anesthesia Post Note  Patient: Brenda [name]  Procedure(s) Performed: DILATATION & CURETTAGE/HYSTEROSCOPY WITH with  [place]     Patient location during evaluation: PACU Anesthesia Type: General Level of consciousness: awake and alert Pain management: pain level controlled Vital Signs Assessment: post-procedure vital signs reviewed and stable Respiratory status: spontaneous breathing, nonlabored ventilation, respiratory function stable and patient connected to nasal cannula oxygen Cardiovascular status: blood pressure returned to baseline and stable Postop Assessment: no apparent nausea or vomiting Anesthetic complications: no   No notable events documented.  Last Vitals:  Vitals:   04/24/22 1445 04/24/22 1450  BP: 129/88 (!) 135/93  Pulse: 88 85  Resp: 14 13  Temp:  36.7 C  SpO2: 100% 100%    Last Pain:  Vitals:   04/24/22 1450  TempSrc:   PainSc: 0-No pain                 Santa Lighter

## 2022-04-24 NOTE — Anesthesia Preprocedure Evaluation (Signed)
Anesthesia Evaluation  Patient identified by MRN, date of birth, ID band Patient awake    Reviewed: Allergy & Precautions, NPO status , Patient's Chart, lab work & pertinent test results  Airway Mallampati: II  TM Distance: >3 FB Neck ROM: Full    Dental  (+) Dental Advisory Given   Pulmonary asthma ,    breath sounds clear to auscultation       Cardiovascular negative cardio ROS   Rhythm:Regular Rate:Normal     Neuro/Psych negative neurological ROS     GI/Hepatic Neg liver ROS, GERD  ,  Endo/Other  diabetes, Type 2  Renal/GU negative Renal ROS     Musculoskeletal   Abdominal   Peds  Hematology  (+) Blood dyscrasia, anemia ,   Anesthesia Other Findings   Reproductive/Obstetrics                             Lab Results  Component Value Date   WBC 3.0 (L) 04/24/2022   HGB 10.1 (L) 04/24/2022   HCT 34.4 (L) 04/24/2022   MCV 71.8 (L) 04/24/2022   PLT 349 04/24/2022   Lab Results  Component Value Date   CREATININE 1.02 03/06/2022   BUN 16 03/06/2022   NA 139 03/06/2022   K 3.8 03/06/2022   CL 104 03/06/2022   CO2 28 03/06/2022    Anesthesia Physical Anesthesia Plan  ASA: 3  Anesthesia Plan: General   Post-op Pain Management: Tylenol PO (pre-op)* and Toradol IV (intra-op)*   Induction: Intravenous  PONV Risk Score and Plan: 3 and Dexamethasone, Ondansetron, Treatment may vary due to age or medical condition and Midazolam  Airway Management Planned: LMA  Additional Equipment: None  Intra-op Plan:   Post-operative Plan: Extubation in OR  Informed Consent: I have reviewed the patients History and Physical, chart, labs and discussed the procedure including the risks, benefits and alternatives for the proposed anesthesia with the patient or authorized representative who has indicated his/her understanding and acceptance.     Dental advisory given  Plan Discussed with:  CRNA  Anesthesia Plan Comments:         Anesthesia Quick Evaluation

## 2022-04-24 NOTE — Anesthesia Procedure Notes (Signed)
Procedure Name: LMA Insertion Date/Time: 04/24/2022 1:42 PM  Performed by: Genelle Bal, CRNAPre-anesthesia Checklist: Patient identified, Emergency Drugs available, Suction available and Patient being monitored Patient Re-evaluated:Patient Re-evaluated prior to induction Oxygen Delivery Method: Circle system utilized Preoxygenation: Pre-oxygenation with 100% oxygen Induction Type: IV induction Ventilation: Mask ventilation without difficulty LMA: LMA inserted LMA Size: 4.0 Number of attempts: 1 Airway Equipment and Method: Bite block Placement Confirmation: positive ETCO2 Tube secured with: Tape Dental Injury: Teeth and Oropharynx as per pre-operative assessment

## 2022-04-24 NOTE — Transfer of Care (Signed)
Immediate Anesthesia Transfer of Care Note  Patient: Brenda [name]  Procedure(s) Performed: DILATATION & CURETTAGE/HYSTEROSCOPY WITH with  [place]  Patient Location: PACU  Anesthesia Type:General  Level of Consciousness: drowsy and patient cooperative  Airway & Oxygen Therapy: Patient Spontanous Breathing and Patient connected to face mask oxygen  Post-op Assessment: Report given to RN and Post -op Vital signs reviewed and stable  Post vital signs: Reviewed and stable  Last Vitals:  Vitals Value Taken Time  BP 100/65 04/24/22 1420  Temp    Pulse 76 04/24/22 1422  Resp 13 04/24/22 1422  SpO2 100 % 04/24/22 1422  Vitals shown include unvalidated device data.  Last Pain:  Vitals:   04/24/22 1135  TempSrc:   PainSc: 0-No pain         Complications: No notable events documented.

## 2022-04-24 NOTE — Progress Notes (Signed)
Preop Diagnosis: EXCESSIVE AND FREQUENT MENSTRUATION   Postop Diagnosis: EXCESSIVE AND FREQUENT MENSTRUATION   Procedure: 1.HYSTEROSCOPY 2.DILATION AND CURETTAGE 3.NOVASURE ABLATION  Anesthesia: Choice   Anesthesiologist: Santa Lighter, MD   Attending: [name], MD   Assistant: N/a  Findings: No intracavitary lesions.  Sound length 11 cm, cervical length 4.5 cm, cavity width 4.5 cm, cavity length 6.5 cm  Pathology: Endometrial Curettings  Fluids: 1000 cc  UOP: 100 cc  EBL: 10 cc  Complications: None  Procedure:  The patient was taken to the operating room after the risks, benefits and alternatives discussed with the patient. The patient verbalized understanding and consent signed and witnessed. The patient was placed under anesthesia and prepped and draped in the normal sterile fashion and Time Out performed per protocol. A bivalve speculum was placed in the patient's vagina and the anterior lip of the cervix was grasped with a single tooth tenaculum. A paracervical block was administered using a total of 10 cc of 1% lidocaine. The uterus sounded to 11 cm. The cervix was dilated for passage of the hysteroscope. The hysteroscope was introduced into the uterine cavity and findings as noted above. Sharp curettage was performed until a gritty texture was noted and currettings sent to pathology. The hysteroscope was reintroduced and no obvious remaining intracavitary lesions were noted. The Novasure instrument was introduced and ablation performed without difficulty. Hysteroscope reintroduced and good ablation results were noted. All instruments were removed. Sponge lap and needle count was correct. The patient tolerated the procedure well and was returned to the recovery room in good condition.

## 2022-04-25 ENCOUNTER — Encounter (HOSPITAL_COMMUNITY): Payer: Self-pay | Admitting: Obstetrics and Gynecology

## 2022-04-27 LAB — SURGICAL PATHOLOGY

## 2022-04-29 ENCOUNTER — Other Ambulatory Visit (HOSPITAL_COMMUNITY): Payer: Self-pay

## 2022-05-05 NOTE — Op Note (Signed)
     Brenda Graff, MD Physician Obstetrics/Gynecology Progress Notes    Signed Date of Service:  04/24/2022  2:10 PM   Signed                                                                                     Preop Diagnosis: EXCESSIVE AND FREQUENT MENSTRUATION    Postop Diagnosis: EXCESSIVE AND FREQUENT MENSTRUATION    Procedure: 1.HYSTEROSCOPY 2.DILATION AND CURETTAGE 3.NOVASURE ABLATION   Anesthesia: Choice    Anesthesiologist: Santa Lighter, MD    Attending: [name], MD    Assistant: N/a   Findings: No intracavitary lesions.  Sound length 11 cm, cervical length 4.5 cm, cavity width 4.5 cm, cavity length 6.5 cm   Pathology: Endometrial Curettings   Fluids: 1000 cc   UOP: 100 cc   EBL: 10 cc   Complications: None   Procedure:  The patient was taken to the operating room after the risks, benefits and alternatives discussed with the patient. The patient verbalized understanding and consent signed and witnessed. The patient was placed under anesthesia and prepped and draped in the normal sterile fashion and Time Out performed per protocol. A bivalve speculum was placed in the patient's vagina and the anterior lip of the cervix was grasped with a single tooth tenaculum. A paracervical block was administered using a total of 10 cc of 1% lidocaine. The uterus sounded to 11 cm. The cervix was dilated for passage of the hysteroscope. The hysteroscope was introduced into the uterine cavity and findings as noted above. Sharp curettage was performed until a gritty texture was noted and currettings sent to pathology. The hysteroscope was reintroduced and no obvious remaining intracavitary lesions were noted. The Novasure instrument was introduced and ablation performed without difficulty. Hysteroscope reintroduced and good ablation results were noted. All instruments were removed. Sponge lap and needle count was correct. The patient tolerated  the procedure well and was returned to the recovery room in good condition.          Note Details  [name], MD File Time 04/24/2022  2:14 PM  Author Type Physician Status Signed  Last Editor Brenda [name], [place] # [phone] [place] Date 04/24/2022

## 2022-05-30 ENCOUNTER — Other Ambulatory Visit (HOSPITAL_COMMUNITY): Payer: Self-pay

## 2022-06-07 ENCOUNTER — Other Ambulatory Visit: Payer: Self-pay | Admitting: Internal Medicine

## 2022-06-08 ENCOUNTER — Other Ambulatory Visit (HOSPITAL_COMMUNITY): Payer: Self-pay

## 2022-06-29 ENCOUNTER — Other Ambulatory Visit (HOSPITAL_COMMUNITY): Payer: Self-pay

## 2022-08-19 ENCOUNTER — Other Ambulatory Visit (HOSPITAL_COMMUNITY): Payer: Self-pay

## 2022-08-19 ENCOUNTER — Encounter (HOSPITAL_COMMUNITY): Payer: Self-pay

## 2022-08-24 ENCOUNTER — Other Ambulatory Visit: Payer: Self-pay

## 2022-09-03 ENCOUNTER — Encounter: Payer: Self-pay | Admitting: Internal Medicine

## 2022-09-03 NOTE — Patient Instructions (Addendum)
     Flu immunization administered today.      Blood work was ordered.   The lab is on the first floor.     Medications changes include :   None      Return in about 6 months (around 03/05/2023) for Physical Exam.

## 2022-09-03 NOTE — Progress Notes (Signed)
Subjective:    Patient ID: Brenda [name], female    DOB: March 04, 1986, 37 y.o.   MRN: [id number]     HPI Jameyah is here for follow up of her chronic medical problems, including PCOS, fibroids, endometrial polyp w/ heavy menstrual bleeding and iron def anemia (gyn), dysthmia, anxiety, GERD, prediabetes  S/p ablation 04/24/22 - periods are light.  Has not had one in the past month.  Has more energy - does not have a lot of energy.    Getting a little sore throat and achy around chest/ back.   Not exercising  Snores a lot.  Wonders about sleep apnea.  Medications and allergies reviewed with patient and updated if appropriate.  Current Outpatient Medications on File Prior to Visit  Medication Sig Dispense Refill   albuterol (VENTOLIN HFA) 108 (90 Base) MCG/ACT inhaler Inhale 1-2 puffs into the lungs every 6 (six) hours as needed for wheezing or shortness of breath. (Patient taking differently: Inhale 1-2 puffs into the lungs every 6 (six) hours as needed (Asthma).) 18 each 5   buPROPion (WELLBUTRIN XL) 150 MG 24 hr tablet Take 1 tablet (150 mg total) by mouth daily. 90 tablet 1   escitalopram (LEXAPRO) 10 MG tablet Take 1 tablet (10 mg total) by mouth daily. 90 tablet 1   fluticasone (FLONASE) 50 MCG/ACT nasal spray Place 2 sprays into both nostrils daily. (Patient taking differently: Place 2 sprays into both nostrils daily as needed for allergies.) 16 g 6   ibuprofen (ADVIL) 600 MG tablet Take 1 tablet (600 mg total) by mouth every 6 (six) hours as needed. 30 tablet 1   loratadine (CLARITIN) 10 MG tablet Take 1 tablet (10 mg total) by mouth daily as needed for allergies. (Patient taking differently: Take 10 mg by mouth daily.) 90 tablet 1   medroxyPROGESTERone (PROVERA) 10 MG tablet Take 10 mg by mouth daily.     metFORMIN (GLUCOPHAGE) 500 MG tablet Take 1 tablet (500 mg total) by mouth 2 (two) times daily with a meal. (Patient taking differently: Take 500 mg by mouth daily with  breakfast.) 180 tablet 3   metFORMIN (GLUCOPHAGE-XR) 500 MG 24 hr tablet Take 1 tablet (500 mg total) by mouth daily. 90 tablet 1   methocarbamol (ROBAXIN) 500 MG tablet Take 1 tablet (500 mg total) by mouth every 6 (six) hours as needed for muscle spasms. 30 tablet 0   montelukast (SINGULAIR) 10 MG tablet TAKE 1 TABLET BY MOUTH EVERYDAY AT BEDTIME (Patient taking differently: Take 10 mg by mouth daily as needed (allergies).) 90 tablet 1   pantoprazole (PROTONIX) 40 MG tablet TAKE 1 TABLET BY MOUTH EVERY DAY 90 tablet 1   No current facility-administered medications on file prior to visit.     Review of Systems  Constitutional:  Positive for diaphoresis (night sweats). Negative for fever.  HENT:  Positive for sore throat (mild).   Respiratory:  Negative for cough, shortness of breath and wheezing.   Cardiovascular:  Negative for chest pain, palpitations and leg swelling.  Musculoskeletal:  Positive for myalgias.  Neurological:  Negative for light-headedness and headaches.       Objective:   Vitals:   09/04/22 0816  BP: 126/80  Pulse: 73  Temp: 98.5 F (36.9 C)  SpO2: 98%   BP Readings from Last 3 Encounters:  09/04/22 126/80  04/24/22 (!) 135/93  04/21/22 125/78   Wt Readings from Last 3 Encounters:  09/04/22 249 lb (112.9 kg)  04/24/22  253 lb (114.8 kg)  03/06/22 254 lb (115.2 kg)   Body mass index is 43.42 kg/m.    Physical Exam Constitutional:      General: She is not in acute distress.    Appearance: Normal appearance. She is not ill-appearing.  HENT:     Head: Normocephalic and atraumatic.     Right Ear: Tympanic membrane, ear canal and external ear normal.     Left Ear: Tympanic membrane, ear canal and external ear normal.     Mouth/Throat:     Mouth: Mucous membranes are moist.     Pharynx: No oropharyngeal exudate or posterior oropharyngeal erythema.  Eyes:     Conjunctiva/sclera: Conjunctivae normal.  Cardiovascular:     Rate and Rhythm: Normal rate  and regular rhythm.     Heart sounds: Normal heart sounds. No murmur heard. Pulmonary:     Effort: Pulmonary effort is normal. No respiratory distress.     Breath sounds: Normal breath sounds. No wheezing or rales.  Musculoskeletal:     Cervical back: Neck supple. No tenderness.     Right lower leg: No edema.     Left lower leg: No edema.  Lymphadenopathy:     Cervical: No cervical adenopathy.  Skin:    General: Skin is warm and dry.     Findings: No rash.  Neurological:     Mental Status: She is alert. Mental status is at baseline.  Psychiatric:        Mood and Affect: Mood normal.        Behavior: Behavior normal.        Lab Results  Component Value Date   WBC 3.0 (L) 04/24/2022   HGB 10.1 (L) 04/24/2022   HCT 34.4 (L) 04/24/2022   PLT 349 04/24/2022   GLUCOSE 89 04/24/2022   CHOL 131 02/01/2017   TRIG 62.0 02/01/2017   HDL 36.00 (L) 02/01/2017   LDLCALC 82 02/01/2017   ALT 21 03/06/2022   AST 17 03/06/2022   NA 141 04/24/2022   K 3.8 04/24/2022   CL 107 04/24/2022   CREATININE 0.92 04/24/2022   BUN 15 04/24/2022   CO2 23 04/24/2022   TSH 0.68 09/01/2021   HGBA1C 6.1 03/06/2022     Assessment & Plan:    See Problem List for Assessment and Plan of chronic medical problems.

## 2022-09-04 ENCOUNTER — Ambulatory Visit (INDEPENDENT_AMBULATORY_CARE_PROVIDER_SITE_OTHER): Payer: 59 | Admitting: Internal Medicine

## 2022-09-04 VITALS — BP 126/80 | HR 73 | Temp 98.5°F | Ht 63.5 in | Wt 249.0 lb

## 2022-09-04 DIAGNOSIS — K219 Gastro-esophageal reflux disease without esophagitis: Secondary | ICD-10-CM

## 2022-09-04 DIAGNOSIS — J301 Allergic rhinitis due to pollen: Secondary | ICD-10-CM | POA: Diagnosis not present

## 2022-09-04 DIAGNOSIS — R7303 Prediabetes: Secondary | ICD-10-CM | POA: Diagnosis not present

## 2022-09-04 DIAGNOSIS — D500 Iron deficiency anemia secondary to blood loss (chronic): Secondary | ICD-10-CM

## 2022-09-04 DIAGNOSIS — E282 Polycystic ovarian syndrome: Secondary | ICD-10-CM | POA: Diagnosis not present

## 2022-09-04 DIAGNOSIS — R0683 Snoring: Secondary | ICD-10-CM | POA: Insufficient documentation

## 2022-09-04 DIAGNOSIS — J45998 Other asthma: Secondary | ICD-10-CM

## 2022-09-04 DIAGNOSIS — F419 Anxiety disorder, unspecified: Secondary | ICD-10-CM | POA: Diagnosis not present

## 2022-09-04 DIAGNOSIS — J029 Acute pharyngitis, unspecified: Secondary | ICD-10-CM

## 2022-09-04 DIAGNOSIS — Z23 Encounter for immunization: Secondary | ICD-10-CM | POA: Diagnosis not present

## 2022-09-04 DIAGNOSIS — F341 Dysthymic disorder: Secondary | ICD-10-CM | POA: Diagnosis not present

## 2022-09-04 LAB — CBC WITH DIFFERENTIAL/PLATELET
Basophils Absolute: 0 10*3/uL (ref 0.0–0.1)
Basophils Relative: 0.6 % (ref 0.0–3.0)
Eosinophils Absolute: 0.2 10*3/uL (ref 0.0–0.7)
Eosinophils Relative: 4.9 % (ref 0.0–5.0)
HCT: 38.2 % (ref 36.0–46.0)
Hemoglobin: 12.3 g/dL (ref 12.0–15.0)
Lymphocytes Relative: 46.1 % — ABNORMAL HIGH (ref 12.0–46.0)
Lymphs Abs: 1.5 10*3/uL (ref 0.7–4.0)
MCHC: 32.2 g/dL (ref 30.0–36.0)
MCV: 75.1 fl — ABNORMAL LOW (ref 78.0–100.0)
Monocytes Absolute: 0.3 10*3/uL (ref 0.1–1.0)
Monocytes Relative: 9.5 % (ref 3.0–12.0)
Neutro Abs: 1.2 10*3/uL — ABNORMAL LOW (ref 1.4–7.7)
Neutrophils Relative %: 38.9 % — ABNORMAL LOW (ref 43.0–77.0)
Platelets: 255 10*3/uL (ref 150.0–400.0)
RBC: 5.09 Mil/uL (ref 3.87–5.11)
RDW: 19.9 % — ABNORMAL HIGH (ref 11.5–15.5)
WBC: 3.2 10*3/uL — ABNORMAL LOW (ref 4.0–10.5)

## 2022-09-04 LAB — IBC PANEL
Iron: 111 ug/dL (ref 42–145)
Saturation Ratios: 25.5 % (ref 20.0–50.0)
TIBC: 435.4 ug/dL (ref 250.0–450.0)
Transferrin: 311 mg/dL (ref 212.0–360.0)

## 2022-09-04 LAB — COMPREHENSIVE METABOLIC PANEL
ALT: 18 U/L (ref 0–35)
AST: 13 U/L (ref 0–37)
Albumin: 4.2 g/dL (ref 3.5–5.2)
Alkaline Phosphatase: 33 U/L — ABNORMAL LOW (ref 39–117)
BUN: 13 mg/dL (ref 6–23)
CO2: 27 mEq/L (ref 19–32)
Calcium: 8.8 mg/dL (ref 8.4–10.5)
Chloride: 104 mEq/L (ref 96–112)
Creatinine, Ser: 0.82 mg/dL (ref 0.40–1.20)
GFR: 93.42 mL/min (ref >60.00)
Glucose, Bld: 97 mg/dL (ref 70–99)
Potassium: 4.2 mEq/L (ref 3.5–5.1)
Sodium: 138 mEq/L (ref 135–145)
Total Bilirubin: 0.3 mg/dL (ref 0.2–1.2)
Total Protein: 7.3 g/dL (ref 6.0–8.3)

## 2022-09-04 LAB — HEMOGLOBIN A1C: Hgb A1c MFr Bld: 6.4 % (ref 4.6–6.5)

## 2022-09-04 LAB — FERRITIN: Ferritin: 4.2 ng/mL — ABNORMAL LOW (ref 10.0–291.0)

## 2022-09-04 NOTE — Assessment & Plan Note (Signed)
Chronic Following with GYN Continue metformin 500 mg with breakfast Heavy menses since secondary to PCOS, fibroids and endometrial polyp-had ablation last fall

## 2022-09-04 NOTE — Assessment & Plan Note (Signed)
Chronic Mild, intermittent Continue singulair, albuterol as needed

## 2022-09-04 NOTE — Assessment & Plan Note (Signed)
Acute Rapid strep here negative Symptoms likely related to viral URI Symptomatic treatment

## 2022-09-04 NOTE — Assessment & Plan Note (Signed)
Chronic GERD controlled Continue pantoprazole 40 mg daily 

## 2022-09-04 NOTE — Assessment & Plan Note (Signed)
Chronic Controlled, Stable Continue Lexapro 10 mg daily, bupropion XL 150 mg daily

## 2022-09-04 NOTE — Assessment & Plan Note (Signed)
Chronic Check a1c Low sugar / carb diet Stressed regular exercise   

## 2022-09-04 NOTE — Assessment & Plan Note (Addendum)
Chronic Overall controlled Continue singular 10 mg nightly, albuterol inhaler as needed Continue Claritin, Flonase

## 2022-09-04 NOTE — Assessment & Plan Note (Signed)
States that she does snore at night Has chronic fatigue OSA very possible Discussed importance of having this evaluated and consequences of "being untreated Referral to [facility] neurology for further evaluation of OSA

## 2022-09-04 NOTE — Assessment & Plan Note (Signed)
Chronic Secondary to heavy menses-PCOS, endometrial polyp, fibroids S/p ablation 04/24/2022 CBC, CMP, iron panel

## 2022-09-08 ENCOUNTER — Encounter: Payer: Self-pay | Admitting: Internal Medicine

## 2022-09-10 DIAGNOSIS — Z0289 Encounter for other administrative examinations: Secondary | ICD-10-CM

## 2022-09-17 ENCOUNTER — Encounter: Payer: Self-pay | Admitting: Neurology

## 2022-09-17 ENCOUNTER — Ambulatory Visit (INDEPENDENT_AMBULATORY_CARE_PROVIDER_SITE_OTHER): Payer: 59 | Admitting: Neurology

## 2022-09-17 VITALS — BP 124/82 | HR 84 | Ht 63.0 in | Wt 250.8 lb

## 2022-09-17 DIAGNOSIS — R0681 Apnea, not elsewhere classified: Secondary | ICD-10-CM | POA: Diagnosis not present

## 2022-09-17 DIAGNOSIS — G4719 Other hypersomnia: Secondary | ICD-10-CM | POA: Diagnosis not present

## 2022-09-17 DIAGNOSIS — R351 Nocturia: Secondary | ICD-10-CM

## 2022-09-17 DIAGNOSIS — R519 Headache, unspecified: Secondary | ICD-10-CM

## 2022-09-17 DIAGNOSIS — R0683 Snoring: Secondary | ICD-10-CM | POA: Diagnosis not present

## 2022-09-17 DIAGNOSIS — Z9189 Other specified personal risk factors, not elsewhere classified: Secondary | ICD-10-CM

## 2022-09-17 DIAGNOSIS — Z820 Family history of epilepsy and other diseases of the nervous system: Secondary | ICD-10-CM

## 2022-09-17 NOTE — Progress Notes (Signed)
Subjective:    Patient ID: Brenda [name] is a 37 y.o. female.  HPI    Star Age, MD, PhD [facility] [address] [place], [place] [address]  Dear Dr. Quay Burow,  I saw your patient, Brenda [name], upon your kind request in my sleep clinic today for initial consultation of her sleep disorder, in particular, concern for underlying obstructive sleep apnea.  The patient is unaccompanied today.  As you know, Brenda [name] is a 37 year old female with an underlying medical history of PCOS, allergies, reflux disease, anemia, menorrhagia with status post ablation in September 2023, prediabetes, asthma, anxiety, depression, and severe obesity with a BMI of over 40, who reports snoring and excessive daytime somnolence as well as witnessed apneas per boyfriend's feedback.  Her Epworth sleepiness score is 10 out of 24, fatigue severity score is 52 out of 63. I reviewed your office note from 09/04/2022.  She suspects that her mom may have sleep apnea, her maternal aunt actually has a CPAP.  She does not wake up rested, she reports disrupted sleep, has nocturia about once per average night and has recently started waking up with a headache.  She is working on weight loss but reports that she does not have much in the way of energy to work out.  She works from home for [employer] in [employer].  She lives with her boyfriend and 3 kids, ages 38, 44 and 77.  She goes to bed between 930 and 10, rise time is around 5:30 AM.  She drinks limited caffeine, usually 1 cup of coffee in the morning and 1 soda per day.  She drinks alcohol rarely, on special occasions, she is a non-smoker.  Symptoms of her sleep related issues have been going on for years she admits.  She has gained weight over time, especially after her last child.  Her Past Medical History Is Significant For: Past Medical History:  Diagnosis Date   Abnormal Pap smear 2007   colpo   Anemia     Anxiety    Asthma    childhood - PRN inhaler   Depression    Fibroid    4 fibroids    GERD (gastroesophageal reflux disease)    Gestational diabetes mellitus    H/O chlamydia infection 2010   H/O varicella    Mass of neck 03/02/2014   Obese    PCOS (polycystic ovarian syndrome)    Pre-diabetes    Rubella non-immune status 01/11/2012   Vaginal Pap smear, abnormal    biopsy - normal     Her Past Surgical History Is Significant For: Past Surgical History:  Procedure Laterality Date   CESAREAN SECTION N/A 02/28/2016   Procedure: CESAREAN SECTION;  Surgeon: [name], MD;  Location: [place];  Service: Obstetrics;  Laterality: N/A;   CESAREAN SECTION WITH BILATERAL TUBAL LIGATION Bilateral 09/29/2018   Procedure: REPEAT CESAREAN SECTION WITH BILATERAL TUBAL LIGATION;  Surgeon: [name], MD;  Location: [place];  Service: Obstetrics;  Laterality: Bilateral;   DILATATION & CURETTAGE/HYSTEROSCOPY WITH MYOSURE N/A 04/24/2022   Procedure: DILATATION & CURETTAGE/HYSTEROSCOPY WITH MYOSURE;  Surgeon: [name], MD;  Location: [place];  Service: Gynecology;  Laterality: N/A;   DILITATION & CURRETTAGE/HYSTROSCOPY WITH NOVASURE ABLATION N/A 04/24/2022   Procedure: DILATATION & CURETTAGE/HYSTEROSCOPY WITH with  NOVASURE ABLATION;  Surgeon: [name], MD;  Location: [place];  Service: Gynecology;  Laterality: N/A;  myousre rep will be here confirmed on  9/5 CS    Her Family History Is Significant For: Family History  Problem Relation Age of Onset   Hypertension Mother        diet controlled no meds   Hypertension Father    Alcohol abuse Father    Heart disease Maternal Aunt    Mental illness Maternal Aunt        bipolar   Diabetes Maternal Aunt    Heart disease Maternal Grandmother    Hypertension Maternal Grandmother    Thrombophlebitis Maternal Grandmother    Diabetes Maternal Grandmother    Stroke Maternal Grandmother    Hypertension Paternal  Grandmother    Heart disease Paternal Grandmother    Asthma Cousin    Cancer Cousin    Heart disease Cousin        congenital heart disease   Learning disabilities Cousin    Anesthesia problems Neg Hx    Sleep apnea Neg Hx     Her Social History Is Significant For: Social History   Socioeconomic History   Marital status: Single    Spouse name: Not on file   Number of children: Not on file   Years of education: Not on file   Highest education level: Not on file  Occupational History   Not on file  Tobacco Use   Smoking status: Never   Smokeless tobacco: Never  Vaping Use   Vaping Use: Never used  Substance and Sexual Activity   Alcohol use: Not Currently    Comment: occasional   Drug use: No   Sexual activity: Yes    Birth control/protection: Surgical    Comment: sept 2017 - removed   Other Topics Concern   Not on file  Social History Narrative   Engaged, 89 year old daughter   Social Determinants of Health   Financial Resource Strain: Low Risk  (09/21/2018)   Overall Financial Resource Strain (CARDIA)    Difficulty of Paying Living Expenses: Not hard at all  Food Insecurity: No Food Insecurity (09/21/2018)   Hunger Vital Sign    Worried About Running Out of Food in the Last Year: Never true    [place] in the Last Year: Never true  Transportation Needs: Unknown (09/21/2018)   PRAPARE - Hydrologist (Medical): No    Lack of Transportation (Non-Medical): Not on file  Physical Activity: Insufficiently Active (03/04/2021)   Exercise Vital Sign    Days of Exercise per Week: 1 day    Minutes of Exercise per Session: 30 min  Stress: Stress Concern Present (03/04/2021)   [place]    Feeling of Stress : Rather much  Social Connections: Unknown (03/04/2021)   Social Connection and Isolation Panel [NHANES]    Frequency of Communication with Friends and Family: Not on file     Frequency of Social Gatherings with Friends and Family: Not on file    Attends Religious Services: 1 to 4 times per year    Active Member of [employer] or Organizations: No    Attends Music therapist: Not on file    Marital Status: Never married    Her Allergies Are:  Allergies  Allergen Reactions   Lexapro [Escitalopram] Other (See Comments)    dizziness   Sertraline Other (See Comments)    Made her cry all the time  :   Her Current Medications Are:  Outpatient Encounter Medications as of 09/17/2022  Medication Sig  albuterol (VENTOLIN HFA) 108 (90 Base) MCG/ACT inhaler Inhale 1-2 puffs into the lungs every 6 (six) hours as needed for wheezing or shortness of breath. (Patient taking differently: Inhale 1-2 puffs into the lungs every 6 (six) hours as needed (Asthma).)   buPROPion (WELLBUTRIN XL) 150 MG 24 hr tablet Take 1 tablet (150 mg total) by mouth daily.   escitalopram (LEXAPRO) 10 MG tablet Take 1 tablet (10 mg total) by mouth daily.   fluticasone (FLONASE) 50 MCG/ACT nasal spray Place 2 sprays into both nostrils daily. (Patient taking differently: Place 2 sprays into both nostrils daily as needed for allergies.)   ibuprofen (ADVIL) 600 MG tablet Take 1 tablet (600 mg total) by mouth every 6 (six) hours as needed.   loratadine (CLARITIN) 10 MG tablet Take 1 tablet (10 mg total) by mouth daily as needed for allergies. (Patient taking differently: Take 10 mg by mouth daily.)   medroxyPROGESTERone (PROVERA) 10 MG tablet Take 10 mg by mouth daily.   metFORMIN (GLUCOPHAGE) 500 MG tablet Take 1 tablet (500 mg total) by mouth 2 (two) times daily with a meal. (Patient taking differently: Take 500 mg by mouth daily with breakfast.)   metFORMIN (GLUCOPHAGE-XR) 500 MG 24 hr tablet Take 1 tablet (500 mg total) by mouth daily.   methocarbamol (ROBAXIN) 500 MG tablet Take 1 tablet (500 mg total) by mouth every 6 (six) hours as needed for muscle spasms.   montelukast (SINGULAIR) 10 MG  tablet TAKE 1 TABLET BY MOUTH EVERYDAY AT BEDTIME (Patient taking differently: Take 10 mg by mouth daily as needed (allergies).)   pantoprazole (PROTONIX) 40 MG tablet TAKE 1 TABLET BY MOUTH EVERY DAY   No facility-administered encounter medications on file as of 09/17/2022.  :   Review of Systems:  Out of a complete 14 point review of systems, all are reviewed and negative with the exception of these symptoms as listed below:   Review of Systems  Neurological:        Pt here for sleep consult  Pt snores,fatigue,headaches . Pt denies hypertension,sleep study ,CPAP machine     ESS:10 FSS:52     Objective:  Neurological Exam  Physical Exam Physical Examination:   Vitals:   09/17/22 0923  BP: 124/82  Pulse: 84    General Examination: The patient is a very pleasant 37 y.o. female in no acute distress. She appears well-developed and well-nourished and well groomed.   HEENT: Normocephalic, atraumatic, pupils are equal, round and reactive to light, extraocular tracking is good without limitation to gaze excursion or nystagmus noted. Hearing is grossly intact. Face is symmetric with normal facial animation. Speech is clear with no dysarthria noted. There is no hypophonia. There is no lip, neck/head, jaw or voice tremor. Neck is supple with full range of passive and active motion. There are no carotid bruits on auscultation. Oropharynx exam reveals: mild mouth dryness, adequate dental hygiene and moderate airway crowding, due to tonsillar size of 3+, Mallampati class III, wider tongue noted, uvula actually small.  Tongue protrudes centrally and palate elevates symmetrically, neck circumference of 16-7/8 inches, minimal overbite noted.   Chest: Clear to auscultation without wheezing, rhonchi or crackles noted.  Heart: S1+S2+0, regular and normal without murmurs, rubs or gallops noted.   Abdomen: Soft, non-tender and non-distended.  Extremities: There is no pitting edema in the distal  lower extremities bilaterally.   Skin: Warm and dry without trophic changes noted.   Musculoskeletal: exam reveals no obvious joint deformities.   Neurologically:  Mental status: The patient is awake, alert and oriented in all 4 spheres. Her immediate and remote memory, attention, language skills and fund of knowledge are appropriate. There is no evidence of aphasia, agnosia, apraxia or anomia. Speech is clear with normal prosody and enunciation. Thought process is linear. Mood is normal and affect is normal.  Cranial nerves II - XII are as described above under HEENT exam.  Motor exam: Normal bulk, strength and tone is noted. There is no obvious action or resting tremor.  Fine motor skills and coordination: grossly intact.  Cerebellar testing: No dysmetria or intention tremor. There is no truncal or gait ataxia.  Sensory exam: intact to light touch in the upper and lower extremities.  Gait, station and balance: She stands easily. No veering to one side is noted. No leaning to one side is noted. Posture is age-appropriate and stance is narrow based. Gait shows normal stride length and normal pace. No problems turning are noted.   Assessment and Plan:  In summary, Brenda [name] is a very pleasant 37 y.o.-year old female with an underlying medical history of PCOS, allergies, reflux disease, anemia, menorrhagia with status post ablation in September 2023, prediabetes, asthma, anxiety, depression, and severe obesity with a BMI of over 40, whose history and physical exam are concerning for sleep disordered breathing, supporting a current working diagnosis of unspecified sleep apnea, with the main differential diagnoses of obstructive sleep apnea (OSA) versus upper airway resistance syndrome (UARS) versus central sleep apnea (CSA), or mixed sleep apnea. A laboratory attended sleep study is typically considered "gold standard" for evaluation of sleep disordered breathing.   I had a long chat with the  patient about my findings and the diagnosis of sleep apnea, particularly OSA, its prognosis and treatment options. We talked about medical/conservative treatments, surgical interventions and non-pharmacological approaches for symptom control. I explained, in particular, the risks and ramifications of untreated moderate to severe OSA, especially with respect to developing cardiovascular disease down the road, including congestive heart failure (CHF), difficult to treat hypertension, cardiac arrhythmias (particularly A-fib), neurovascular complications including TIA, stroke and dementia. Even type 2 diabetes has, in part, been linked to untreated OSA. Symptoms of untreated OSA may include (but may not be limited to) daytime sleepiness, nocturia (i.e. frequent nighttime urination), memory problems, mood irritability and suboptimally controlled or worsening mood disorder such as depression and/or anxiety, lack of energy, lack of motivation, physical discomfort, as well as recurrent headaches, especially morning or nocturnal headaches. We talked about the importance of maintaining a healthy lifestyle and striving for healthy weight.  I recommended a sleep study at this time. I outlined the differences between a laboratory attended sleep study which is considered more comprehensive and accurate over the option of a home sleep test (HST); the latter may lead to underestimation of sleep disordered breathing in some instances and does not help with diagnosing upper airway resistance syndrome and is not accurate enough to diagnose primary central sleep apnea typically. I outlined possible surgical and non-surgical treatment options of OSA, including the use of a positive airway pressure (PAP) device (i.e. CPAP, AutoPAP/APAP or BiPAP in certain circumstances), a custom-made dental device (aka oral appliance, which would require a referral to a specialist dentist or orthodontist typically, and is generally speaking not  considered for patients with full dentures or edentulous state), upper airway surgical options, such as traditional UPPP (which is not considered a first-line treatment) or the Inspire device (hypoglossal nerve stimulator, which would involve a  referral for consultation with an ENT surgeon, after careful selection, following inclusion criteria - also not first-line treatment). I explained the PAP treatment option to the patient in detail, as this is generally considered first-line treatment.  The patient indicated that she would be willing to try PAP therapy, if the need arises. I explained the importance of being compliant with PAP treatment, not only for insurance purposes but primarily to improve patient's symptoms symptoms, and for the patient's long term health benefit, including to reduce Her cardiovascular risks longer-term.    We will pick up our discussion about the next steps and treatment options after testing.  We will keep her posted as to the test results by phone call and/or MyChart messaging where possible.  We will plan to follow-up in sleep clinic accordingly as well.  I answered all her questions today and the patient was in agreement.   I encouraged her to call with any interim questions, concerns, problems or updates or email [place] through [place].  Generally speaking, sleep test authorizations may take up to 2 weeks, sometimes less, sometimes longer, the patient is encouraged to get in touch with [place] if they do not hear back from the sleep lab staff directly within the next 2 weeks.  Thank you very much for allowing me to participate in the care of this nice patient. If I can be of any further assistance to you please do not hesitate to call me at [phone].  Sincerely,   Star Age, MD, PhD

## 2022-09-17 NOTE — Patient Instructions (Signed)

## 2022-10-01 ENCOUNTER — Telehealth: Payer: Self-pay | Admitting: Neurology

## 2022-10-01 NOTE — Telephone Encounter (Signed)
NPSG pending uploaded notes on the portal.  HST [employer] no auth req

## 2022-10-05 NOTE — Telephone Encounter (Signed)
Checked status on the portal it is still pending.

## 2022-10-12 ENCOUNTER — Encounter: Payer: Self-pay | Admitting: Neurology

## 2022-10-12 NOTE — Telephone Encounter (Signed)
[employer] denied the NPSG  I spoke with the patient.  She is going to do a mail out for the HST.  It is scheduled for 10/14/22 once she receive the device she will have  a week to do the study.

## 2022-10-14 ENCOUNTER — Ambulatory Visit: Payer: 59 | Admitting: Neurology

## 2022-10-14 DIAGNOSIS — G4733 Obstructive sleep apnea (adult) (pediatric): Secondary | ICD-10-CM | POA: Diagnosis not present

## 2022-10-14 DIAGNOSIS — R0681 Apnea, not elsewhere classified: Secondary | ICD-10-CM

## 2022-10-14 DIAGNOSIS — Z820 Family history of epilepsy and other diseases of the nervous system: Secondary | ICD-10-CM

## 2022-10-14 DIAGNOSIS — R519 Headache, unspecified: Secondary | ICD-10-CM

## 2022-10-14 DIAGNOSIS — G4734 Idiopathic sleep related nonobstructive alveolar hypoventilation: Secondary | ICD-10-CM

## 2022-10-14 DIAGNOSIS — Z9189 Other specified personal risk factors, not elsewhere classified: Secondary | ICD-10-CM

## 2022-10-14 DIAGNOSIS — R351 Nocturia: Secondary | ICD-10-CM

## 2022-10-14 DIAGNOSIS — R0683 Snoring: Secondary | ICD-10-CM

## 2022-10-14 DIAGNOSIS — G4719 Other hypersomnia: Secondary | ICD-10-CM

## 2022-10-28 ENCOUNTER — Telehealth: Payer: Self-pay | Admitting: Neurology

## 2022-10-28 NOTE — Telephone Encounter (Signed)
Pt called. Stated she is following up on sleep study.

## 2022-10-29 ENCOUNTER — Other Ambulatory Visit: Payer: Self-pay | Admitting: Internal Medicine

## 2022-10-29 NOTE — Addendum Note (Signed)
Addended by: Star Age on: 10/29/2022 12:46 PM   Modules accepted: Orders

## 2022-10-29 NOTE — Procedures (Signed)
[facility]  HOME SLEEP TEST (Watch PAT) REPORT  STUDY DATE: 10/24/2022  DOB: 24-Nov-1985  MRN: [id number]  ORDERING CLINICIAN: Star Age, MD, PhD   REFERRING CLINICIAN: Binnie Rail, MD   CLINICAL INFORMATION/HISTORY: 37 year old female with an underlying medical history of PCOS, allergies, reflux disease, anemia, menorrhagia with status post ablation in September 2023, prediabetes, asthma, anxiety, depression, and severe obesity with a BMI of over 40, who reports snoring and excessive daytime somnolence as well as witnessed apneas.   Epworth sleepiness score: 10/24.  BMI: 44.5 kg/m  FINDINGS:   Sleep Summary:   Total Recording Time (hours, min): 7 hours, 14 min  Total Sleep Time (hours, min):  6 hours, 17 min  Percent REM (%):    29.5%   Respiratory Indices:   Calculated pAHI (per hour):  54.9/hour         REM pAHI:    80.7/hour       NREM pAHI: 44.2/hour  Central pAHI: 1.6/hour  Oxygen Saturation Statistics:    Oxygen Saturation (%) Mean: 93%   Minimum oxygen saturation (%):                 61%   O2 Saturation Range (%): 61 - 99%    O2 Saturation (minutes) <=88%: 24.9 min  Pulse Rate Statistics:   Pulse Mean (bpm):    77/min    Pulse Range (52 - 109/min)   IMPRESSION: OSA (obstructive sleep apnea), severe Nocturnal Hypoxemia  RECOMMENDATION:  This home sleep test demonstrates severe obstructive sleep apnea with a total AHI of 54.9/hour and O2 nadir of 61% with significant time below or at 88% saturation of over 20 minutes for the night, indicating nocturnal hypoxemia.  Snoring was detected, in the moderate to loud range fairly consistently throughout the study, at times in the mild range. Treatment with positive airway pressure is highly recommended. This will require - ideally - a full night CPAP titration study for proper treatment settings, O2 monitoring and mask fitting. For now, the patient will be advised to proceed with  an autoPAP titration/trial at home. A laboratory attended titration study can be considered in the future for optimization of treatment settings and to improve tolerance and compliance. Alternative treatment options are limited secondary to the severity of the patient's sleep disordered breathing, but may include surgical treatment with an implantable hypoglossal nerve stimulator (in carefully selected candidates, meeting criteria).  Concomitant weight loss is recommended, where clinically appropriate. Please note, that untreated obstructive sleep apnea may carry additional perioperative morbidity. Patients with significant obstructive sleep apnea should receive perioperative PAP therapy and the surgeons and particularly the anesthesiologist should be informed of the diagnosis and the severity of the sleep disordered breathing. The patient should be cautioned not to drive, work at heights, or operate dangerous or heavy equipment when tired or sleepy. Review and reiteration of good sleep hygiene measures should be pursued with any patient. Other causes of the patient's symptoms, including circadian rhythm disturbances, an underlying mood disorder, medication effect and/or an underlying medical problem cannot be ruled out based on this test. Clinical correlation is recommended.  The patient and her referring provider will be notified of the test results. The patient will be seen in follow up in sleep clinic at [facility].  I certify that I have reviewed the raw data recording prior to the issuance of this report in accordance with the standards of the American Academy of Sleep Medicine (AASM).    INTERPRETING PHYSICIAN:  Star Age, MD, PhD Medical Director, [place] [facility]) Tina, ABPN (Neurology and Sleep)   [facility] [address] [place], [place] [address] [phone]

## 2022-11-02 ENCOUNTER — Telehealth: Payer: Self-pay | Admitting: *Deleted

## 2022-11-02 NOTE — Telephone Encounter (Signed)
New, Willodean Rosenthal, RN; [name]; [name] Received, Thank you!       Previous Messages    ----- Message ----- From: Brandon Melnick, RN Sent: 11/02/2022  10:46 AM EDT To: Darlina Guys; Miquel Dunn; Nash Shearer; * Subject: new autopap user for severe OSA                New order in EPIC for new autopap user for severe OSA.   Brenda [name]. [facility] Female, 37 y.o., 1986/06/06 Pronouns: she/her/hers MRN: [id number] Phone: [phone] Thank you,  Lovey Newcomer RN

## 2022-11-02 NOTE — Telephone Encounter (Signed)
-----   Message from Star Age, MD sent at 10/29/2022 12:46 PM EDT ----- Urgent set up requested on PAP therapy, due to severe OSA. Patient referred by Dr. Quay Burow, seen by me on 09/17/2022, patient had a HST on 10/24/2022.    Please call and notify the patient that the recent home sleep test showed obstructive sleep apnea in the severe range. I recommend treatment for this in the form of autoPAP, which means, that we don't have to bring her in for a sleep study with CPAP, but will let her start using a so called autoPAP machine at home, through a DME company (of her choice, or as per insurance requirement). The DME representative will fit the patient with a mask of choice, educate her on how to use the machine, how to put the mask on, etc. I have placed an order in the chart. Please send the order to a local DME, talk to patient, send report to referring MD. Please also reinforce the need for compliance with treatment. We will need a FU in sleep clinic for 10 weeks post-PAP set up, please arrange that with me or one of our NPs. Thanks,   Star Age, MD, PhD [facility])

## 2022-11-02 NOTE — Telephone Encounter (Signed)
I called Brenda [name]. I advised Brenda [name] that Dr. Rexene Alberts reviewed their sleep study results and found that Brenda [name] has Severe OSA. Dr. Rexene Alberts recommends that Brenda [name] start autopap. I reviewed PAP compliance expectations with the Brenda [name]. Brenda [name] is agreeable to starting an auto-PAP. I advised Brenda [name] that an order will be sent to a DME, [facility], and they will call the Brenda [name] within about one week after they file with the Brenda [name]'s insurance. They will show the Brenda [name] how to use the machine, fit for masks, and troubleshoot the auto-PAP if needed. A follow up appt was made for insurance purposes with Dr. Rexene Alberts on 01-11-2023 at [place].  Brenda [name] verbalized understanding of results. Brenda [name] had no questions at this time but was encouraged to call back if questions arise. I have sent the order to [facility] and have received confirmation that they have received the order.

## 2022-11-05 ENCOUNTER — Ambulatory Visit (INDEPENDENT_AMBULATORY_CARE_PROVIDER_SITE_OTHER): Payer: 59 | Admitting: Internal Medicine

## 2022-11-05 VITALS — BP 130/78 | HR 104 | Temp 98.5°F | Ht 63.0 in | Wt 250.0 lb

## 2022-11-05 DIAGNOSIS — E559 Vitamin D deficiency, unspecified: Secondary | ICD-10-CM

## 2022-11-05 DIAGNOSIS — U071 COVID-19: Secondary | ICD-10-CM

## 2022-11-05 DIAGNOSIS — J301 Allergic rhinitis due to pollen: Secondary | ICD-10-CM

## 2022-11-05 DIAGNOSIS — R7303 Prediabetes: Secondary | ICD-10-CM | POA: Diagnosis not present

## 2022-11-05 DIAGNOSIS — R0981 Nasal congestion: Secondary | ICD-10-CM | POA: Diagnosis not present

## 2022-11-05 LAB — POC INFLUENZA A&B (BINAX/QUICKVUE)
Influenza A, POC: NEGATIVE
Influenza B, POC: NEGATIVE

## 2022-11-05 LAB — POCT RESPIRATORY SYNCYTIAL VIRUS: RSV Rapid Ag: NEGATIVE

## 2022-11-05 LAB — POC SOFIA SARS ANTIGEN FIA: SARS Coronavirus 2 Ag: POSITIVE — AB

## 2022-11-05 MED ORDER — NIRMATRELVIR/RITONAVIR (PAXLOVID)TABLET: 3.0000 | ORAL_TABLET | Freq: Two times a day (BID) | ORAL | 0 refills | Status: AC

## 2022-11-05 MED ORDER — BENZONATATE 100 MG PO CAPS: 100.0000 mg | ORAL_CAPSULE | Freq: Two times a day (BID) | ORAL | 1 refills | Status: DC | PRN

## 2022-11-05 NOTE — Progress Notes (Signed)
Patient ID: Brenda [name], female   DOB: 1986/02/01, 37 y.o.   MRN: [id number]        Chief Complaint: follow up with covid infection, allergies, preDM, low vit d       HPI:  Brenda [name] is a 37 y.o. female here with 4 days onset uri symptoms with congestion, ear popping, ST but n/v/d or sob.  Pt denies chest pain, increased sob or doe, wheezing, orthopnea, PND, increased LE swelling, palpitations, dizziness or syncope.   Pt denies polydipsia, polyuria, or new focal neuro s/s.    Pt denies recent wt loss, night sweats, loss of appetite, or other constitutional symptoms         Wt Readings from Last 3 Encounters:  11/05/22 250 lb (113.4 kg)  09/17/22 250 lb 12.8 oz (113.8 kg)  09/04/22 249 lb (112.9 kg)   BP Readings from Last 3 Encounters:  11/05/22 130/78  09/17/22 124/82  09/04/22 126/80         Past Medical History:  Diagnosis Date   Abnormal Pap smear 2007   colpo   Anemia    Anxiety    Asthma    childhood - PRN inhaler   Depression    Fibroid    4 fibroids    GERD (gastroesophageal reflux disease)    Gestational diabetes mellitus    H/O chlamydia infection 2010   H/O varicella    Mass of neck 03/02/2014   Obese    PCOS (polycystic ovarian syndrome)    Pre-diabetes    Rubella non-immune status 01/11/2012   Vaginal Pap smear, abnormal    biopsy - normal    Past Surgical History:  Procedure Laterality Date   CESAREAN SECTION N/A 02/28/2016   Procedure: CESAREAN SECTION;  Surgeon: [name], MD;  Location: [place];  Service: Obstetrics;  Laterality: N/A;   CESAREAN SECTION WITH BILATERAL TUBAL LIGATION Bilateral 09/29/2018   Procedure: REPEAT CESAREAN SECTION WITH BILATERAL TUBAL LIGATION;  Surgeon: [name], MD;  Location: [place];  Service: Obstetrics;  Laterality: Bilateral;   DILATATION & CURETTAGE/HYSTEROSCOPY WITH MYOSURE N/A 04/24/2022   Procedure: DILATATION & CURETTAGE/HYSTEROSCOPY WITH MYOSURE;  Surgeon: [name], MD;  Location: [place];  Service: Gynecology;  Laterality: N/A;   DILITATION & CURRETTAGE/HYSTROSCOPY WITH NOVASURE ABLATION N/A 04/24/2022   Procedure: DILATATION & CURETTAGE/HYSTEROSCOPY WITH with  NOVASURE ABLATION;  Surgeon: [name], MD;  Location: [place];  Service: Gynecology;  Laterality: N/A;  myousre rep will be here confirmed on 9/5 CS    reports that she has never smoked. She has never used smokeless tobacco. She reports that she does not currently use alcohol. She reports that she does not use drugs. family history includes Alcohol abuse in her father; Asthma in her cousin; Cancer in her cousin; Diabetes in her maternal aunt and maternal grandmother; Heart disease in her cousin, maternal aunt, maternal grandmother, and paternal grandmother; Hypertension in her father, maternal grandmother, mother, and paternal grandmother; Learning disabilities in her cousin; Mental illness in her maternal aunt; Stroke in her maternal grandmother; Thrombophlebitis in her maternal grandmother. Allergies  Allergen Reactions   Lexapro [Escitalopram] Other (See Comments)    dizziness   Sertraline Other (See Comments)    Made her cry all the time   Current Outpatient Medications on File Prior to Visit  Medication Sig Dispense Refill   albuterol (VENTOLIN HFA) 108 (90 Base) MCG/ACT inhaler Inhale 1-2 puffs into the lungs every 6 (six) hours as needed  for wheezing or shortness of breath. (Patient taking differently: Inhale 1-2 puffs into the lungs every 6 (six) hours as needed (Asthma).) 18 each 5   buPROPion (WELLBUTRIN XL) 150 MG 24 hr tablet TAKE 1 TABLET(150 MG) BY MOUTH DAILY 90 tablet 1   escitalopram (LEXAPRO) 10 MG tablet TAKE 1 TABLET(10 MG) BY MOUTH DAILY 90 tablet 1   fluticasone (FLONASE) 50 MCG/ACT nasal spray Place 2 sprays into both nostrils daily. (Patient taking differently: Place 2 sprays into both nostrils daily as needed for allergies.) 16 g 6   ibuprofen (ADVIL) 600 MG tablet  Take 1 tablet (600 mg total) by mouth every 6 (six) hours as needed. 30 tablet 1   loratadine (CLARITIN) 10 MG tablet Take 1 tablet (10 mg total) by mouth daily as needed for allergies. (Patient taking differently: Take 10 mg by mouth daily.) 90 tablet 1   medroxyPROGESTERone (PROVERA) 10 MG tablet Take 10 mg by mouth daily.     metFORMIN (GLUCOPHAGE) 500 MG tablet Take 1 tablet (500 mg total) by mouth 2 (two) times daily with a meal. (Patient taking differently: Take 500 mg by mouth daily with breakfast.) 180 tablet 3   metFORMIN (GLUCOPHAGE-XR) 500 MG 24 hr tablet Take 1 tablet (500 mg total) by mouth daily. 90 tablet 1   methocarbamol (ROBAXIN) 500 MG tablet Take 1 tablet (500 mg total) by mouth every 6 (six) hours as needed for muscle spasms. 30 tablet 0   montelukast (SINGULAIR) 10 MG tablet TAKE 1 TABLET BY MOUTH EVERYDAY AT BEDTIME (Patient taking differently: Take 10 mg by mouth daily as needed (allergies).) 90 tablet 1   pantoprazole (PROTONIX) 40 MG tablet TAKE 1 TABLET BY MOUTH EVERY DAY 90 tablet 1   No current facility-administered medications on file prior to visit.        ROS:  All others reviewed and negative.  Objective        PE:  BP 130/78   Pulse (!) 104   Temp 98.5 F (36.9 C) (Oral)   Ht 5\' 3"  (1.6 m)   Wt 250 lb (113.4 kg)   SpO2 91%   BMI 44.29 kg/m                 Constitutional: Pt appears in NAD               HENT: Head: NCAT.                Right Ear: External ear normal.                 Left Ear: External ear normal. Bilat tm's with mild erythema.  Max sinus areas mild tender.  Pharynx with mild erythema, no exudate               Eyes: . Pupils are equal, round, and reactive to light. Conjunctivae and EOM are normal               Nose: without d/c or deformity               Neck: Neck supple. Gross normal ROM               Cardiovascular: Normal rate and regular rhythm.                 Pulmonary/Chest: Effort normal and breath sounds without rales or  wheezing.  Neurological: Pt is alert. At baseline orientation, motor grossly intact               Skin: Skin is warm. No rashes, no other new lesions, LE edema - none               Psychiatric: Pt behavior is normal without agitation   Micro: none  Cardiac tracings I have personally interpreted today:  none  Pertinent Radiological findings (summarize): none   Lab Results  Component Value Date   WBC 3.2 (L) 09/04/2022   HGB 12.3 09/04/2022   HCT 38.2 09/04/2022   PLT 255.0 09/04/2022   GLUCOSE 97 09/04/2022   CHOL 131 02/01/2017   TRIG 62.0 02/01/2017   HDL 36.00 (L) 02/01/2017   LDLCALC 82 02/01/2017   ALT 18 09/04/2022   AST 13 09/04/2022   NA 138 09/04/2022   K 4.2 09/04/2022   CL 104 09/04/2022   CREATININE 0.82 09/04/2022   BUN 13 09/04/2022   CO2 27 09/04/2022   TSH 0.68 09/01/2021   HGBA1C 6.4 09/04/2022   POCT  - COVID - Positive, Flu - neg, RSV - neg  Assessment/Plan:  Brenda [name] is a 37 y.o. Black or African American [2] female with  has a past medical history of Abnormal Pap smear (2007), Anemia, Anxiety, Asthma, Depression, Fibroid, GERD (gastroesophageal reflux disease), Gestational diabetes mellitus, H/O chlamydia infection (2010), H/O varicella, Mass of neck (03/02/2014), Obese, PCOS (polycystic ovarian syndrome), Pre-diabetes, Rubella non-immune status (01/11/2012), and Vaginal Pap smear, abnormal.  COVID-19 virus infection Mild to mod, for antibx course paxlovid, cough med prn,  to f/u any worsening symptoms or concerns   Allergic rhinitis Stable, continue singulair 10 mg qd  Prediabetes Lab Results  Component Value Date   HGBA1C 6.4 09/04/2022   Stable, pt to continue current medical treatment metformin 500 bid   Vitamin D deficiency Last vitamin D Lab Results  Component Value Date   VD25OH 17 11/18/2016   Slow, reminded to take oral replacement  Followup: Return if symptoms worsen or fail to  improve.  Cathlean Cower, MD 11/07/2022 6:50 PM [facility]

## 2022-11-05 NOTE — Patient Instructions (Signed)
Your Covid testing was Positive today  Your RSV and Flu testing were negative  Please take all new medication as prescribed  - the paxlovid antibiotic, and tessalon for cough if needed  Please continue all other medications as before, and refills have been done if requested.  Please have the pharmacy call with any other refills you may need.  Please keep your appointments with your specialists as you may have planned

## 2022-11-07 ENCOUNTER — Encounter: Payer: Self-pay | Admitting: Internal Medicine

## 2022-11-07 NOTE — Assessment & Plan Note (Signed)
Stable, continue singulair 10 mg qd

## 2022-11-07 NOTE — Assessment & Plan Note (Signed)
Mild to mod, for antibx course  - paxlovid, cough med prn,  to f/u any worsening symptoms or concerns °

## 2022-11-07 NOTE — Assessment & Plan Note (Signed)
Lab Results  Component Value Date   HGBA1C 6.4 09/04/2022   Stable, pt to continue current medical treatment metformin 500 bid

## 2022-11-07 NOTE — Assessment & Plan Note (Signed)
Last vitamin D Lab Results  Component Value Date   VD25OH 17 11/18/2016   Slow, reminded to take oral replacement

## 2022-11-09 ENCOUNTER — Encounter: Payer: Self-pay | Admitting: Internal Medicine

## 2022-11-09 ENCOUNTER — Telehealth: Payer: Self-pay | Admitting: Internal Medicine

## 2022-11-09 NOTE — Telephone Encounter (Signed)
Yes this is common, and ok for mucinex 600 - 1200 mg bid prn OTC   Ok for work note as well per pt request

## 2022-11-09 NOTE — Telephone Encounter (Signed)
Last week she saw Dr. Jenny Reichmann.  Looks like she was diagnosed with COVID and given an antiviral.  Can consult with him, but may need to be seen again

## 2022-11-09 NOTE — Telephone Encounter (Signed)
Pt called stating she saw Dr. Quay Burow last week and was diagnosed with Covid. He gave her a prescription for an antibiotic. Tomorrow is her last day of that antibiotic but her ears are still hurting. She is wondering what she should do?

## 2022-11-10 NOTE — Telephone Encounter (Signed)
My-chart message sent to patient to schedule follow up appointment if not any better.

## 2022-11-11 ENCOUNTER — Encounter: Payer: Self-pay | Admitting: Internal Medicine

## 2022-11-17 ENCOUNTER — Encounter (HOSPITAL_COMMUNITY): Payer: Self-pay

## 2022-11-17 ENCOUNTER — Other Ambulatory Visit (HOSPITAL_COMMUNITY): Payer: Self-pay

## 2022-11-23 ENCOUNTER — Other Ambulatory Visit: Payer: Self-pay

## 2022-12-01 ENCOUNTER — Encounter: Payer: Self-pay | Admitting: Internal Medicine

## 2022-12-01 ENCOUNTER — Telehealth: Payer: 59 | Admitting: Internal Medicine

## 2022-12-01 NOTE — Progress Notes (Deleted)
    Subjective:    Patient ID: Brenda [name], female    DOB: 11/01/1985, 37 y.o.   MRN: [id number]      HPI Brenda [name] is here for No chief complaint on file.     Mental health burnout - no focus -     Medications and allergies reviewed with patient and updated if appropriate.  Current Outpatient Medications on File Prior to Visit  Medication Sig Dispense Refill   albuterol (VENTOLIN HFA) 108 (90 Base) MCG/ACT inhaler Inhale 1-2 puffs into the lungs every 6 (six) hours as needed for wheezing or shortness of breath. (Patient taking differently: Inhale 1-2 puffs into the lungs every 6 (six) hours as needed (Asthma).) 18 each 5   benzonatate (TESSALON PERLES) 100 MG capsule Take 1 capsule (100 mg total) by mouth 2 (two) times daily as needed for cough. 40 capsule 1   buPROPion (WELLBUTRIN XL) 150 MG 24 hr tablet TAKE 1 TABLET(150 MG) BY MOUTH DAILY 90 tablet 1   escitalopram (LEXAPRO) 10 MG tablet TAKE 1 TABLET(10 MG) BY MOUTH DAILY 90 tablet 1   fluticasone (FLONASE) 50 MCG/ACT nasal spray Place 2 sprays into both nostrils daily. (Patient taking differently: Place 2 sprays into both nostrils daily as needed for allergies.) 16 g 6   ibuprofen (ADVIL) 600 MG tablet Take 1 tablet (600 mg total) by mouth every 6 (six) hours as needed. 30 tablet 1   loratadine (CLARITIN) 10 MG tablet Take 1 tablet (10 mg total) by mouth daily as needed for allergies. (Patient taking differently: Take 10 mg by mouth daily.) 90 tablet 1   medroxyPROGESTERone (PROVERA) 10 MG tablet Take 10 mg by mouth daily.     metFORMIN (GLUCOPHAGE) 500 MG tablet Take 1 tablet (500 mg total) by mouth 2 (two) times daily with a meal. (Patient taking differently: Take 500 mg by mouth daily with breakfast.) 180 tablet 3   metFORMIN (GLUCOPHAGE-XR) 500 MG 24 hr tablet Take 1 tablet (500 mg total) by mouth daily. 90 tablet 1   methocarbamol (ROBAXIN) 500 MG tablet Take 1 tablet (500 mg total) by mouth every 6 (six) hours as needed  for muscle spasms. 30 tablet 0   montelukast (SINGULAIR) 10 MG tablet TAKE 1 TABLET BY MOUTH EVERYDAY AT BEDTIME (Patient taking differently: Take 10 mg by mouth daily as needed (allergies).) 90 tablet 1   pantoprazole (PROTONIX) 40 MG tablet TAKE 1 TABLET BY MOUTH EVERY DAY 90 tablet 1   No current facility-administered medications on file prior to visit.    Review of Systems     Objective:  There were no vitals filed for this visit. BP Readings from Last 3 Encounters:  11/05/22 130/78  09/17/22 124/82  09/04/22 126/80   Wt Readings from Last 3 Encounters:  11/05/22 250 lb (113.4 kg)  09/17/22 250 lb 12.8 oz (113.8 kg)  09/04/22 249 lb (112.9 kg)   There is no height or weight on file to calculate BMI.    Physical Exam         Assessment & Plan:    See Problem List for Assessment and Plan of chronic medical problems.

## 2022-12-02 ENCOUNTER — Ambulatory Visit: Payer: 59 | Admitting: Internal Medicine

## 2022-12-02 DIAGNOSIS — F419 Anxiety disorder, unspecified: Secondary | ICD-10-CM

## 2022-12-02 DIAGNOSIS — R41840 Attention and concentration deficit: Secondary | ICD-10-CM

## 2022-12-08 ENCOUNTER — Other Ambulatory Visit (HOSPITAL_COMMUNITY): Payer: Self-pay

## 2023-01-07 ENCOUNTER — Telehealth: Payer: Self-pay | Admitting: *Deleted

## 2023-01-07 NOTE — Telephone Encounter (Signed)
I called pt and she had not gotten due to financial reasons. I relayed [employer] phone # [phone] to call and see what [place] insurance cost would be now.  She has severe OSA. Was a urgent referral. Explained reason for being on pap therapy.  She verbalized understanding.  She will call back in a week to let us know if she was able to reach them  she appreciated call back.

## 2023-01-11 ENCOUNTER — Encounter: Payer: 59 | Admitting: Neurology

## 2023-01-16 IMAGING — CT CT HEAD W/O CM
4 series · 17 of 47 positions shown, 19 images · non-contrast
Comparison: None.

CLINICAL DATA: 34-year-old female with dizziness and neurologic
deficit.

EXAM:
CT HEAD WITHOUT CONTRAST
TECHNIQUE: Contiguous axial images were obtained from the base of the skull
through the vertex without intravenous contrast.

[Series 2: head wo · axial · 0.46mm/px · z∈[-348,-248]mm · 7 of 28 slices shown, 9 images]
[im 4/28  brain]
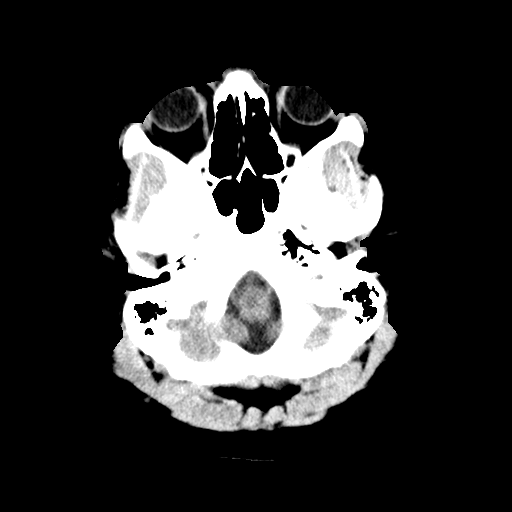
[im 4/28  bone]
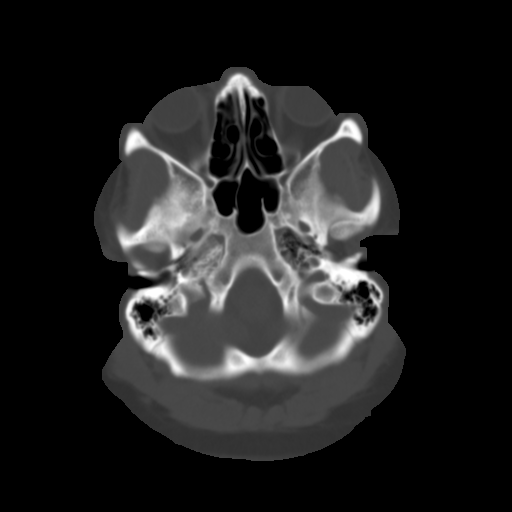
[im 7/28  brain]
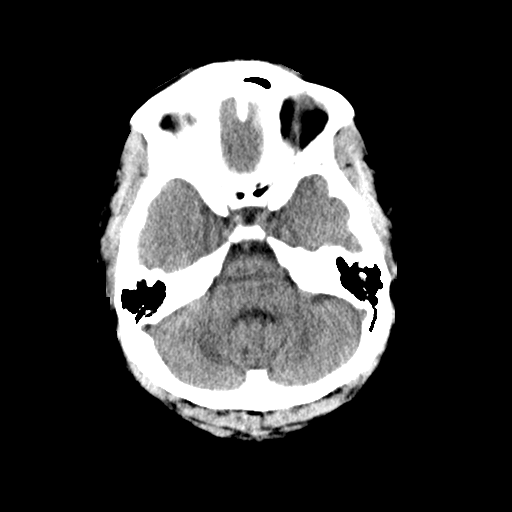
[im 11/28  brain]
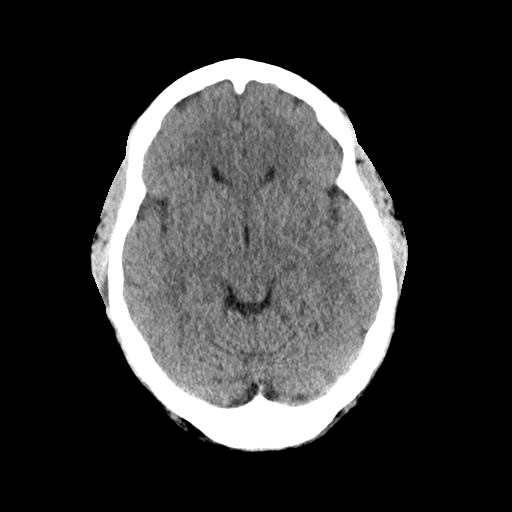
[im 14/28  brain]
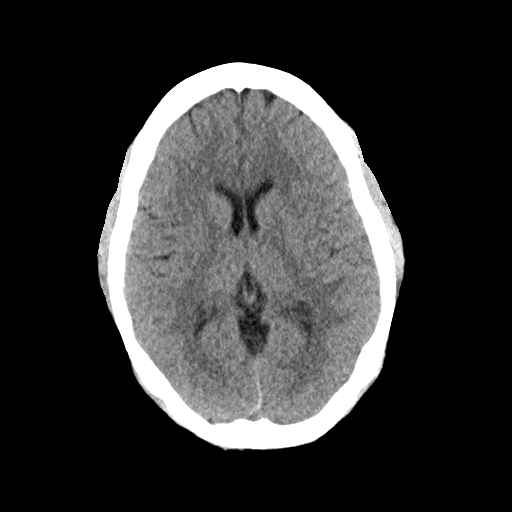
[im 17/28  brain]
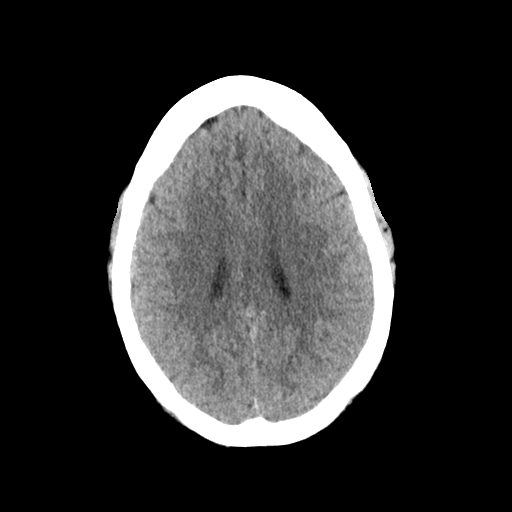
[im 17/28  bone]
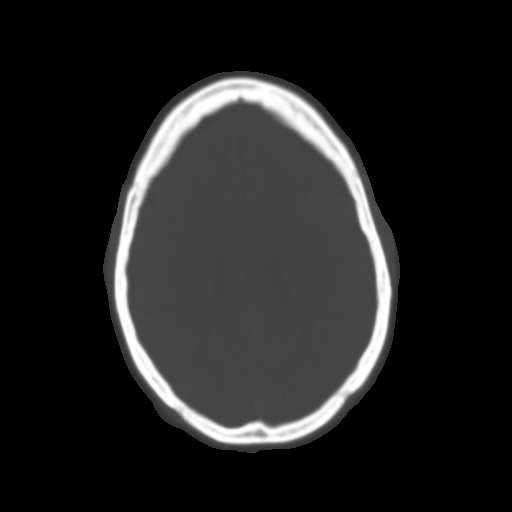
[im 21/28  brain]
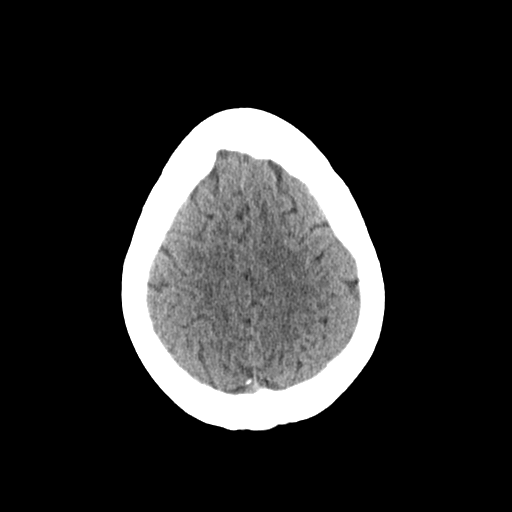
[im 24/28  brain]
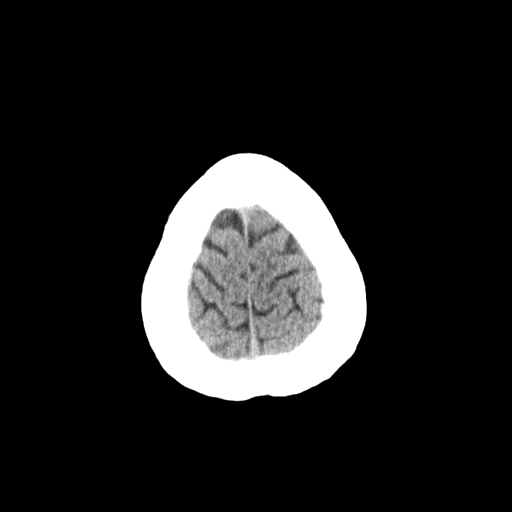

[Series 3: head bone · axial · 0.46mm/px · z∈[-351,-301]mm · 4 of 70 slices shown]
[im 7/70  bone]
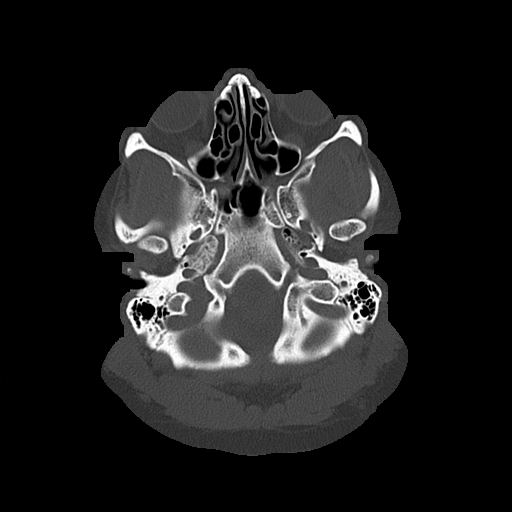
[im 14/70  bone]
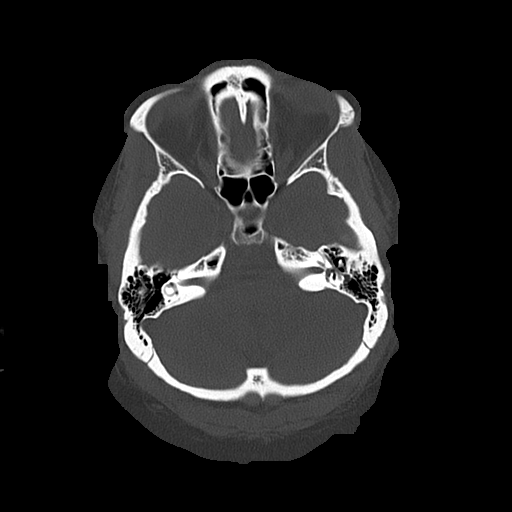
[im 21/70  bone]
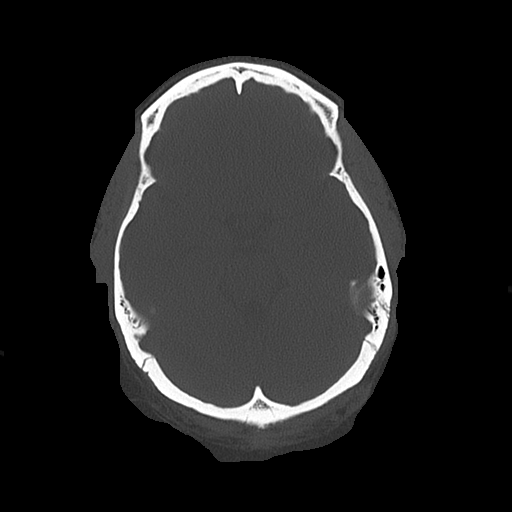
[im 32/70  bone]
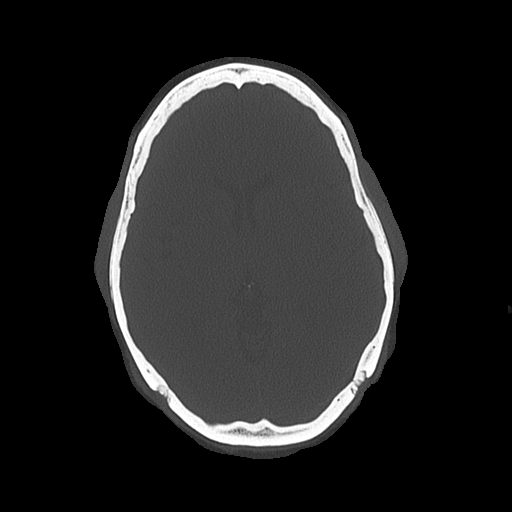

[Series 4: coronal soft · coronal · 0.33mm/px · 3 of 67 slices shown]
[im 23/67  brain]
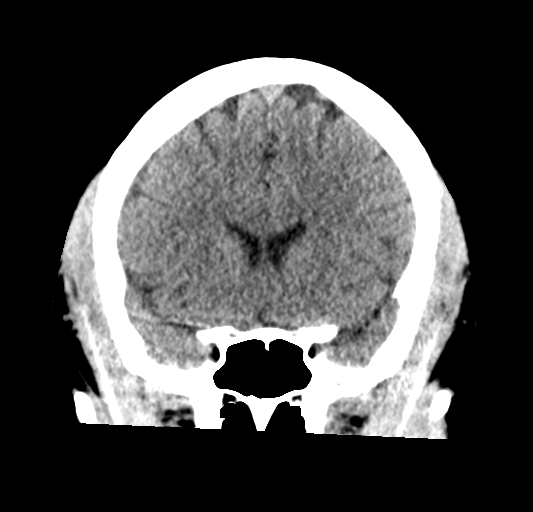
[im 30/67  brain]
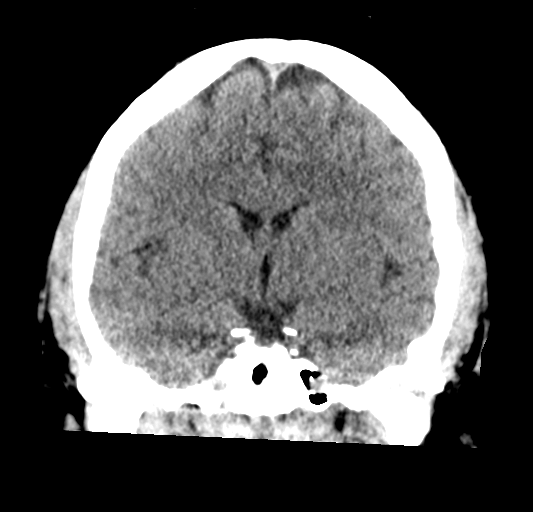
[im 37/67  brain]
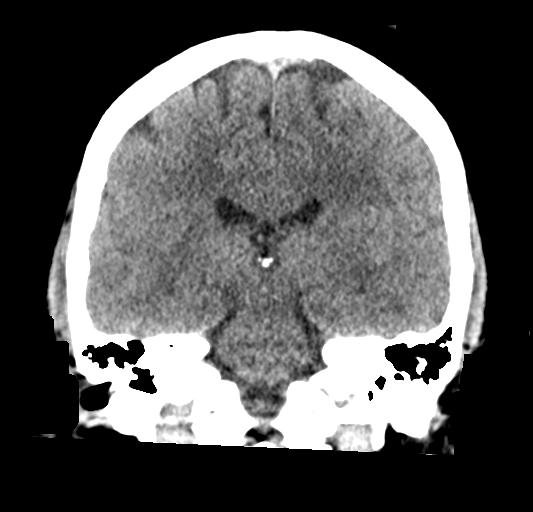

[Series 5: sagittal soft · sagittal · 0.33mm/px · 3 of 58 slices shown]
[im 20/58  brain]
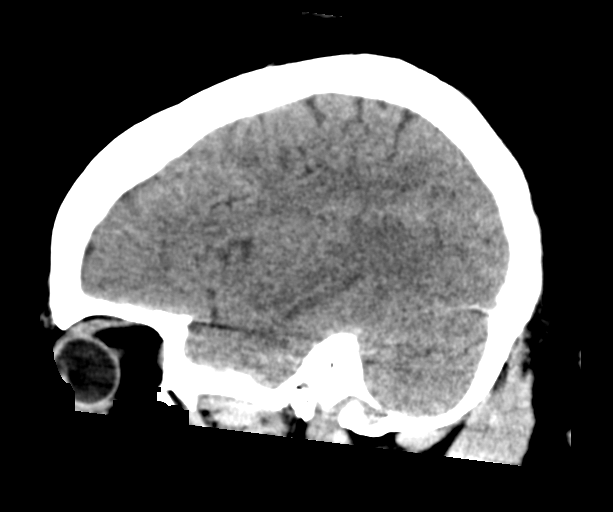
[im 29/58  brain]
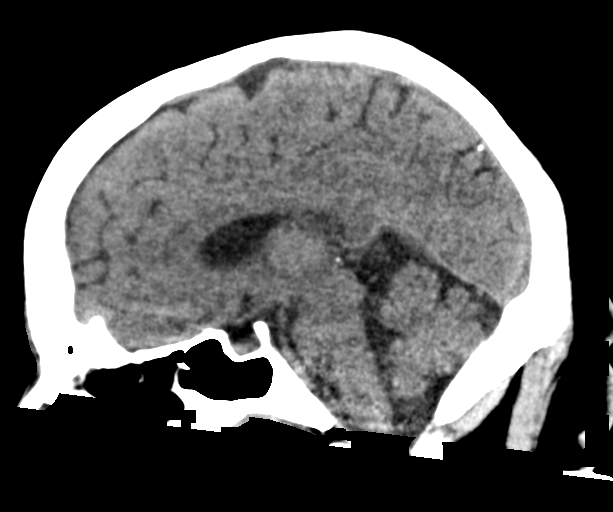
[im 39/58  brain]
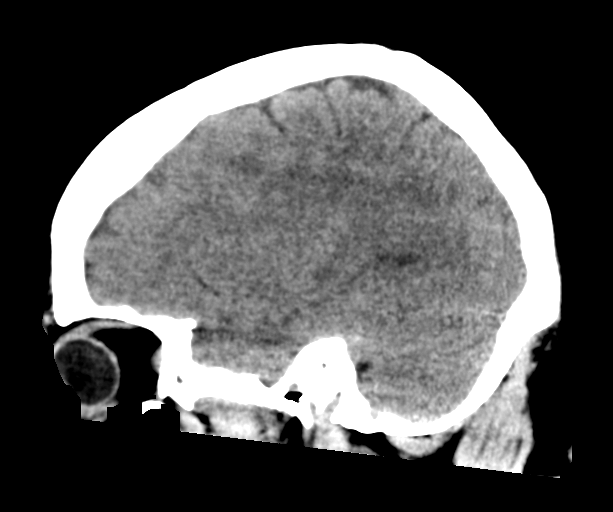

[17 of 47 positions shown; findings below may reference images not displayed]

FINDINGS: Brain: The ventricles and sulci appropriate size for patient's age.
The gray-white matter discrimination is preserved. There is no acute
intracranial hemorrhage. No mass effect or midline shift no
extra-axial fluid collection.

Vascular: No hyperdense vessel or unexpected calcification.

Skull: Normal. Negative for fracture or focal lesion.

Sinuses/Orbits: No acute finding.

Other: None
IMPRESSION: Unremarkable noncontrast CT of the brain.

## 2023-03-08 ENCOUNTER — Encounter: Payer: 59 | Admitting: Internal Medicine

## 2023-03-29 ENCOUNTER — Other Ambulatory Visit: Payer: Self-pay | Admitting: Internal Medicine

## 2023-04-26 ENCOUNTER — Encounter: Payer: Self-pay | Admitting: Internal Medicine

## 2023-04-26 ENCOUNTER — Ambulatory Visit (INDEPENDENT_AMBULATORY_CARE_PROVIDER_SITE_OTHER): Payer: 59 | Admitting: Internal Medicine

## 2023-04-26 VITALS — BP 130/88 | HR 85 | Temp 98.3°F | Ht 63.0 in | Wt 257.0 lb

## 2023-04-26 DIAGNOSIS — L309 Dermatitis, unspecified: Secondary | ICD-10-CM | POA: Insufficient documentation

## 2023-04-26 DIAGNOSIS — E282 Polycystic ovarian syndrome: Secondary | ICD-10-CM

## 2023-04-26 DIAGNOSIS — R7303 Prediabetes: Secondary | ICD-10-CM

## 2023-04-26 DIAGNOSIS — F419 Anxiety disorder, unspecified: Secondary | ICD-10-CM | POA: Diagnosis not present

## 2023-04-26 LAB — HEMOGLOBIN A1C: Hgb A1c MFr Bld: 6.1 % (ref 4.6–6.5)

## 2023-04-26 LAB — COMPREHENSIVE METABOLIC PANEL
ALT: 13 U/L (ref 0–35)
AST: 13 U/L (ref 0–37)
Albumin: 3.9 g/dL (ref 3.5–5.2)
Alkaline Phosphatase: 30 U/L — ABNORMAL LOW (ref 39–117)
BUN: 14 mg/dL (ref 6–23)
CO2: 25 meq/L (ref 19–32)
Calcium: 9.2 mg/dL (ref 8.4–10.5)
Chloride: 102 meq/L (ref 96–112)
Creatinine, Ser: 1.06 mg/dL (ref 0.40–1.20)
GFR: 68.34 mL/min (ref >60.00)
Glucose, Bld: 81 mg/dL (ref 70–99)
Potassium: 4.1 meq/L (ref 3.5–5.1)
Sodium: 136 meq/L (ref 135–145)
Total Bilirubin: 0.3 mg/dL (ref 0.2–1.2)
Total Protein: 7.3 g/dL (ref 6.0–8.3)

## 2023-04-26 MED ORDER — METFORMIN HCL ER 500 MG PO TB24: 500.0000 mg | ORAL_TABLET | Freq: Every day | ORAL | 1 refills | Status: DC

## 2023-04-26 MED ORDER — TACROLIMUS 0.1 % EX OINT: TOPICAL_OINTMENT | CUTANEOUS | 0 refills | Status: DC

## 2023-04-26 MED ORDER — TRIAMCINOLONE ACETONIDE 0.1 % EX CREA: 1.0000 | TOPICAL_CREAM | Freq: Two times a day (BID) | CUTANEOUS | 2 refills | Status: DC

## 2023-04-26 NOTE — Assessment & Plan Note (Signed)
Chronic Controlled Taking both Wellbutrin XL 150 mg daily and Lexapro 10 mg daily-states that one of them does not make her feel well she is not sure which 1 Advised to first try decreasing Lexapro to 5 mg daily and see if that helped-if her symptoms were still controlled being on the lower dose would be better than being on a higher dose Discussed that she can try holding the bupropion if the above did not work and then restarting it if it does not alleviate her symptoms

## 2023-04-26 NOTE — Assessment & Plan Note (Signed)
Acute on chronic Has a history of eczema Her rash on her right wrist is likely eczema as are the rashes on her left hand and eyelids-nose Triamcinolone cream twice daily as needed for up to 14 days on hand rash Protopic ointment for rash on face Call if no improvement-can refer to dermatology

## 2023-04-26 NOTE — Assessment & Plan Note (Signed)
Chronic Following with GYN Has not been taking the metformin recently and she does think that helped her PCOS symptoms and also helped her appetite Restart metformin XR 500 mg with breakfast

## 2023-04-26 NOTE — Assessment & Plan Note (Signed)
Chronic A1c 6.4% earlier this year Discussed how close she is to developing diabetes Stressed regular exercise, weight loss and diabetic diet Check cmp, A1c today Advised follow-up every 6 months

## 2023-04-26 NOTE — Patient Instructions (Addendum)
     Have blood work done today.     Medications changes include :  triamcinolone cream - steroid cream for body rashes, tacrolimus for face rashes as needed      Return in about 6 months (around 10/24/2023), or if symptoms worsen or fail to improve, for Physical Exam.

## 2023-04-26 NOTE — Progress Notes (Signed)
Subjective:    Patient ID: Brenda [name], female    DOB: June 19, 1986, 37 y.o.   MRN: [id number]      HPI Kambrey is here for  Chief Complaint  Patient presents with   Rash    Rash on right wrist     Rash  - started months ago.  It comes and goes.  It flares intermittently and itches.  It has expanded and spread into that hand.  She also has a little on her left hand and her nose and her eyes..     Has tried aloe, neosporin.  This has helped the rash on her right wrist-is not as flared up.  She has not put anything on the other areas.  H/o eczema.    Frustrated by her weight.  Having difficulty losing weight.  Medications and allergies reviewed with patient and updated if appropriate.  Current Outpatient Medications on File Prior to Visit  Medication Sig Dispense Refill   albuterol (VENTOLIN HFA) 108 (90 Base) MCG/ACT inhaler Inhale 1-2 puffs into the lungs every 6 (six) hours as needed for wheezing or shortness of breath. (Patient taking differently: Inhale 1-2 puffs into the lungs every 6 (six) hours as needed (Asthma).) 18 each 5   benzonatate (TESSALON PERLES) 100 MG capsule Take 1 capsule (100 mg total) by mouth 2 (two) times daily as needed for cough. 40 capsule 1   buPROPion (WELLBUTRIN XL) 150 MG 24 hr tablet TAKE 1 TABLET(150 MG) BY MOUTH DAILY 90 tablet 1   escitalopram (LEXAPRO) 10 MG tablet TAKE 1 TABLET(10 MG) BY MOUTH DAILY 90 tablet 1   fluticasone (FLONASE) 50 MCG/ACT nasal spray Place 2 sprays into both nostrils daily. (Patient taking differently: Place 2 sprays into both nostrils daily as needed for allergies.) 16 g 6   ibuprofen (ADVIL) 600 MG tablet Take 1 tablet (600 mg total) by mouth every 6 (six) hours as needed. 30 tablet 1   loratadine (CLARITIN) 10 MG tablet Take 1 tablet (10 mg total) by mouth daily as needed for allergies. (Patient taking differently: Take 10 mg by mouth daily.) 90 tablet 1   medroxyPROGESTERone (PROVERA) 10 MG tablet Take 10 mg  by mouth daily.     metFORMIN (GLUCOPHAGE) 500 MG tablet Take 1 tablet (500 mg total) by mouth 2 (two) times daily with a meal. (Patient taking differently: Take 500 mg by mouth daily with breakfast.) 180 tablet 3   metFORMIN (GLUCOPHAGE-XR) 500 MG 24 hr tablet Take 1 tablet (500 mg total) by mouth daily. 90 tablet 1   methocarbamol (ROBAXIN) 500 MG tablet Take 1 tablet (500 mg total) by mouth every 6 (six) hours as needed for muscle spasms. 30 tablet 0   montelukast (SINGULAIR) 10 MG tablet TAKE 1 TABLET BY MOUTH EVERYDAY AT BEDTIME (Patient taking differently: Take 10 mg by mouth daily as needed (allergies).) 90 tablet 1   pantoprazole (PROTONIX) 40 MG tablet TAKE 1 TABLET BY MOUTH EVERY DAY 90 tablet 1   No current facility-administered medications on file prior to visit.    Review of Systems     Objective:   Vitals:   04/26/23 0853  BP: 130/88  Pulse: 85  Temp: 98.3 F (36.8 C)  SpO2: 97%   BP Readings from Last 3 Encounters:  04/26/23 130/88  11/05/22 130/78  09/17/22 124/82   Wt Readings from Last 3 Encounters:  04/26/23 257 lb (116.6 kg)  11/05/22 250 lb (113.4 kg)  09/17/22 250 lb 12.8  oz (113.8 kg)   Body mass index is 45.53 kg/m.    Physical Exam Constitutional:      General: She is not in acute distress.    Appearance: Normal appearance. She is not ill-appearing.  HENT:     Head: Normocephalic and atraumatic.  Skin:    General: Skin is warm and dry.     Findings: Rash (Circular slightly raised rash posterior right breast with a couple of satellite lesions around the rash and extending into the hand, few lesions on left hand-papules and mild difficult to see rash on nose and eyelids) present.  Neurological:     Mental Status: She is alert. Mental status is at baseline.  Psychiatric:        Mood and Affect: Mood normal.            Assessment & Plan:    See Problem List for Assessment and Plan of chronic medical problems.

## 2023-04-28 DIAGNOSIS — H5203 Hypermetropia, bilateral: Secondary | ICD-10-CM | POA: Diagnosis not present

## 2023-05-19 DIAGNOSIS — H5213 Myopia, bilateral: Secondary | ICD-10-CM | POA: Diagnosis not present

## 2023-06-06 ENCOUNTER — Other Ambulatory Visit: Payer: Self-pay | Admitting: Internal Medicine

## 2023-06-23 ENCOUNTER — Encounter: Payer: Self-pay | Admitting: Internal Medicine

## 2023-06-23 NOTE — Progress Notes (Deleted)
    Subjective:    Patient ID: Brenda [name], female    DOB: [birth year], 37 y.o.   MRN: [id number]      HPI Ethelee is here for No chief complaint on file.   Knot on left wrist  -   Frequent nausea     Medications and allergies reviewed with patient and updated if appropriate.  Current Outpatient Medications on File Prior to Visit  Medication Sig Dispense Refill   albuterol (VENTOLIN HFA) 108 (90 Base) MCG/ACT inhaler INHALE 1 TO 2 PUFFS INTO THE LUNGS EVERY 6 HOURS AS NEEDED FOR WHEEZING OR SHORTNESS OF BREATH. 8.5 g 5   benzonatate (TESSALON PERLES) 100 MG capsule Take 1 capsule (100 mg total) by mouth 2 (two) times daily as needed for cough. 40 capsule 1   buPROPion (WELLBUTRIN XL) 150 MG 24 hr tablet TAKE 1 TABLET(150 MG) BY MOUTH DAILY 90 tablet 1   escitalopram (LEXAPRO) 10 MG tablet TAKE 1 TABLET(10 MG) BY MOUTH DAILY 90 tablet 1   fluticasone (FLONASE) 50 MCG/ACT nasal spray Place 2 sprays into both nostrils daily. (Patient taking differently: Place 2 sprays into both nostrils daily as needed for allergies.) 16 g 6   ibuprofen (ADVIL) 600 MG tablet Take 1 tablet (600 mg total) by mouth every 6 (six) hours as needed. 30 tablet 1   loratadine (CLARITIN) 10 MG tablet Take 1 tablet (10 mg total) by mouth daily as needed for allergies. (Patient taking differently: Take 10 mg by mouth daily.) 90 tablet 1   medroxyPROGESTERone (PROVERA) 10 MG tablet Take 10 mg by mouth daily.     metFORMIN (GLUCOPHAGE-XR) 500 MG 24 hr tablet Take 1 tablet (500 mg total) by mouth daily. Take with food 90 tablet 1   methocarbamol (ROBAXIN) 500 MG tablet Take 1 tablet (500 mg total) by mouth every 6 (six) hours as needed for muscle spasms. 30 tablet 0   montelukast (SINGULAIR) 10 MG tablet TAKE 1 TABLET BY MOUTH EVERYDAY AT BEDTIME (Patient taking differently: Take 10 mg by mouth daily as needed (allergies).) 90 tablet 1   pantoprazole (PROTONIX) 40 MG tablet TAKE 1 TABLET BY MOUTH EVERY DAY 90  tablet 1   tacrolimus (PROTOPIC) 0.1 % ointment Apply topically 2 (two) times daily as needed on face. 30 g 0   triamcinolone cream (KENALOG) 0.1 % Apply 1 Application topically 2 (two) times daily. 30 g 2   No current facility-administered medications on file prior to visit.    Review of Systems     Objective:  There were no vitals filed for this visit. BP Readings from Last 3 Encounters:  04/26/23 130/88  11/05/22 130/78  09/17/22 124/82   Wt Readings from Last 3 Encounters:  04/26/23 257 lb (116.6 kg)  11/05/22 250 lb (113.4 kg)  09/17/22 250 lb 12.8 oz (113.8 kg)   There is no height or weight on file to calculate BMI.    Physical Exam         Assessment & Plan:    See Problem List for Assessment and Plan of chronic medical problems.

## 2023-06-24 ENCOUNTER — Ambulatory Visit: Payer: 59 | Admitting: Internal Medicine

## 2023-06-29 ENCOUNTER — Other Ambulatory Visit: Payer: Self-pay | Admitting: Obstetrics and Gynecology

## 2023-06-29 ENCOUNTER — Other Ambulatory Visit (HOSPITAL_COMMUNITY): Payer: Self-pay

## 2023-06-30 ENCOUNTER — Other Ambulatory Visit: Payer: Self-pay | Admitting: Obstetrics and Gynecology

## 2023-07-18 ENCOUNTER — Other Ambulatory Visit: Payer: Self-pay | Admitting: Internal Medicine

## 2023-07-28 NOTE — Pre-Procedure Instructions (Signed)
Surgical Instructions   Your procedure is scheduled on August 06, 2023. Report to [facility] Main Entrance "A" at 9:00 A.M., then check in with the Admitting office. Any questions or running late day of surgery: call [phone]  Questions prior to your surgery date: call [phone], Monday-Friday, 8am-4pm. If you experience any cold or flu symptoms such as cough, fever, chills, shortness of breath, etc. between now and your scheduled surgery, please notify us at the above number.     Remember:  Do not eat after midnight the night before your surgery   You may drink clear liquids until 8:00 AM the morning of your surgery.   Clear liquids allowed are: Water, Non-Citrus Juices (without pulp), Carbonated Beverages, Clear Tea (no milk, honey, etc.), Black Coffee Only (NO MILK, CREAM OR POWDERED CREAMER of any kind), and Gatorade.    Take these medicines the morning of surgery with A SIP OF WATER: buPROPion (WELLBUTRIN XL)  escitalopram (LEXAPRO)  loratadine (CLARITIN)  medroxyPROGESTERone (PROVERA)  pantoprazole (PROTONIX)    May take these medicines IF NEEDED: albuterol (VENTOLIN HFA) inhaler - please bring inhaler with you morning of surgery fluticasone (FLONASE) nasal spray  methocarbamol (ROBAXIN)  montelukast (SINGULAIR)    One week prior to surgery, STOP taking any Aspirin (unless otherwise instructed by your surgeon) Aleve, Naproxen, Ibuprofen, Motrin, Advil, Goody's, BC's, all herbal medications, fish oil, and non-prescription vitamins.   WHAT DO I DO ABOUT MY DIABETES MEDICATION?   Do not take metFORMIN (GLUCOPHAGE-XR) the morning of surgery.   HOW TO MANAGE YOUR DIABETES BEFORE AND AFTER SURGERY  Why is it important to control my blood sugar before and after surgery? Improving blood sugar levels before and after surgery helps healing and can limit problems. A way of improving blood sugar control is eating a healthy diet by:  Eating less sugar and  carbohydrates  Increasing activity/exercise  Talking with your doctor about reaching your blood sugar goals High blood sugars (greater than 180 mg/dL) can raise your risk of infections and slow your recovery, so you will need to focus on controlling your diabetes during the weeks before surgery. Make sure that the doctor who takes care of your diabetes knows about your planned surgery including the date and location.  How do I manage my blood sugar before surgery? Check your blood sugar at least 4 times a day, starting 2 days before surgery, to make sure that the level is not too high or low.  Check your blood sugar the morning of your surgery when you wake up and every 2 hours until you get to the Short Stay unit.  If your blood sugar is less than 70 mg/dL, you will need to treat for low blood sugar: Do not take insulin. Treat a low blood sugar (less than 70 mg/dL) with  cup of clear juice (cranberry or apple), 4 glucose tablets, OR glucose gel. Recheck blood sugar in 15 minutes after treatment (to make sure it is greater than 70 mg/dL). If your blood sugar is not greater than 70 mg/dL on recheck, call [phone] for further instructions. Report your blood sugar to the short stay nurse when you get to Short Stay.  If you are admitted to the hospital after surgery: Your blood sugar will be checked by the staff and you will probably be given insulin after surgery (instead of oral diabetes medicines) to make sure you have good blood sugar levels. The goal for blood sugar control after surgery is 80-180 mg/dL.  Do NOT Smoke (Tobacco/Vaping) for 24 hours prior to your procedure.  If you use a CPAP at night, you may bring your mask/headgear for your overnight stay.   You will be asked to remove any contacts, glasses, piercing's, hearing aid's, dentures/partials prior to surgery. Please bring cases for these items if needed.    Patients discharged the day of surgery will  not be allowed to drive home, and someone needs to stay with them for 24 hours.  SURGICAL WAITING ROOM VISITATION Patients may have no more than 2 support people in the waiting area - these visitors may rotate.   Pre-op nurse will coordinate an appropriate time for 1 ADULT support person, who may not rotate, to accompany patient in pre-op.  Children under the age of 75 must have an adult with them who is not the patient and must remain in the main waiting area with an adult.  If the patient needs to stay at the hospital during part of their recovery, the visitor guidelines for inpatient rooms apply.  Please refer to the [facility] website for the visitor guidelines for any additional information.   If you received a COVID test during your pre-op visit  it is requested that you wear a mask when out in public, stay away from anyone that may not be feeling well and notify your surgeon if you develop symptoms. If you have been in contact with anyone that has tested positive in the last 10 days please notify you surgeon.      Pre-operative CHG Bathing Instructions   You can play a key role in reducing the risk of infection after surgery. Your skin needs to be as free of germs as possible. You can reduce the number of germs on your skin by washing with CHG (chlorhexidine gluconate) soap before surgery. CHG is an antiseptic soap that kills germs and continues to kill germs even after washing.   DO NOT use if you have an allergy to chlorhexidine/CHG or antibacterial soaps. If your skin becomes reddened or irritated, stop using the CHG and notify one of our RNs at [phone].              TAKE A SHOWER THE NIGHT BEFORE SURGERY AND THE DAY OF SURGERY    Please keep in mind the following:  DO NOT shave, including legs and underarms, 48 hours prior to surgery.   You may shave your face before/day of surgery.  Place clean sheets on your bed the night before surgery Use a clean washcloth (not used  since being washed) for each shower. DO NOT sleep with pet's night before surgery.  CHG Shower Instructions:  Wash your face and private area with normal soap. If you choose to wash your hair, wash first with your normal shampoo.  After you use shampoo/soap, rinse your hair and body thoroughly to remove shampoo/soap residue.  Turn the water OFF and apply half the bottle of CHG soap to a CLEAN washcloth.  Apply CHG soap ONLY FROM YOUR NECK DOWN TO YOUR TOES (washing for 3-5 minutes)  DO NOT use CHG soap on face, private areas, open wounds, or sores.  Pay special attention to the area where your surgery is being performed.  If you are having back surgery, having someone wash your back for you may be helpful. Wait 2 minutes after CHG soap is applied, then you may rinse off the CHG soap.  Pat dry with a clean towel  Put on clean pajamas  Additional instructions for the day of surgery: DO NOT APPLY any lotions, deodorants, cologne, or perfumes.   Do not wear jewelry or makeup Do not wear nail polish, gel polish, artificial nails, or any other type of covering on natural nails (fingers and toes) Do not bring valuables to the hospital. [facility] is not responsible for valuables/personal belongings. Put on clean/comfortable clothes.  Please brush your teeth.  Ask your nurse before applying any prescription medications to the skin.

## 2023-07-29 ENCOUNTER — Inpatient Hospital Stay (HOSPITAL_COMMUNITY)
Admission: RE | Admit: 2023-07-29 | Discharge: 2023-07-29 | Disposition: A | Discharge status: Home / Self Care | Payer: 59 | Source: Ambulatory Visit

## 2023-07-29 NOTE — Progress Notes (Signed)
Per pt, she did not arrive for PAT appointment because she is cancelling her surgery. Pt instructed to call MD office to notify as well.

## 2023-08-02 NOTE — Progress Notes (Signed)
Spoke with Truddie Coco, surgery scheduler at Dr. Su Hilt' office. She states she did speak with the patient on 07/29/2023 and patient cancelled.  OR scheduler made aware.

## 2023-08-06 ENCOUNTER — Encounter (HOSPITAL_COMMUNITY): Admission: RE | Payer: Self-pay | Source: Ambulatory Visit

## 2023-08-06 ENCOUNTER — Inpatient Hospital Stay (HOSPITAL_COMMUNITY): Admission: RE | Admit: 2023-08-06 | Payer: 59 | Source: Ambulatory Visit | Admitting: Obstetrics and Gynecology

## 2023-08-06 SURGERY — HYSTERECTOMY, ABDOMINAL
Anesthesia: General

## 2023-10-04 ENCOUNTER — Other Ambulatory Visit: Payer: Self-pay

## 2023-10-24 ENCOUNTER — Encounter: Payer: Self-pay | Admitting: Internal Medicine

## 2023-10-24 NOTE — Progress Notes (Signed)
      Subjective:    Patient ID: Brenda [name], female    DOB: [birth year], 38 y.o.   MRN: [id number]     HPI Brenda [name] is here for follow up of her chronic medical problems.  ADHD-has been taking Wellbutrin  150 mg daily  Ck a1c  Medications and allergies reviewed with patient and updated if appropriate.  Current Outpatient Medications on File Prior to Visit  Medication Sig Dispense Refill  . medroxyPROGESTERone (PROVERA) 10 MG tablet Take 10 mg by mouth daily.    . methocarbamol  (ROBAXIN ) 500 MG tablet Take 1 tablet (500 mg total) by mouth every 6 (six) hours as needed for muscle spasms. 30 tablet 0  . montelukast  (SINGULAIR ) 10 MG tablet TAKE 1 TABLET BY MOUTH EVERYDAY AT BEDTIME (Patient taking differently: Take 10 mg by mouth daily as needed (allergies).) 90 tablet 1  . tacrolimus  (PROTOPIC ) 0.1 % ointment Apply topically 2 (two) times daily as needed on face. 30 g 0  . triamcinolone  cream (KENALOG ) 0.1 % Apply 1 Application topically 2 (two) times daily. 30 g 2   No current facility-administered medications on file prior to visit.     Review of Systems     Objective:  There were no vitals filed for this visit. BP Readings from Last 3 Encounters:  10/26/23 (!) 134/90  04/26/23 130/88  11/05/22 130/78   Wt Readings from Last 3 Encounters:  10/26/23 265 lb (120.2 kg)  04/26/23 257 lb (116.6 kg)  11/05/22 250 lb (113.4 kg)   There is no height or weight on file to calculate BMI.    Physical Exam     Lab Results  Component Value Date   WBC 3.2 (L) 09/04/2022   HGB 12.3 09/04/2022   HCT 38.2 09/04/2022   PLT 255.0 09/04/2022   GLUCOSE 81 04/26/2023   CHOL 131 02/01/2017   TRIG 62.0 02/01/2017   HDL 36.00 (L) 02/01/2017   LDLCALC 82 02/01/2017   ALT 13 04/26/2023   AST 13 04/26/2023   NA 136 04/26/2023   K 4.1 04/26/2023   CL 102 04/26/2023   CREATININE 1.06 04/26/2023   BUN 14 04/26/2023   CO2 25 04/26/2023   TSH 0.68 09/01/2021   HGBA1C 6.1  04/26/2023     Assessment & Plan:    See Problem List for Assessment and Plan of chronic medical problems.    This encounter was created in error - please disregard. This encounter was created in error - please disregard.

## 2023-10-24 NOTE — Patient Instructions (Addendum)
      Blood work was ordered.       Medications changes include :   None    A referral was ordered and someone will call you to schedule an appointment.     Return in about 6 months (around 04/26/2024) for Physical Exam.

## 2023-10-25 ENCOUNTER — Encounter: Admitting: Internal Medicine

## 2023-10-25 ENCOUNTER — Ambulatory Visit: Payer: Self-pay | Admitting: Internal Medicine

## 2023-10-25 DIAGNOSIS — J45998 Other asthma: Secondary | ICD-10-CM

## 2023-10-25 DIAGNOSIS — F341 Dysthymic disorder: Secondary | ICD-10-CM

## 2023-10-25 DIAGNOSIS — E282 Polycystic ovarian syndrome: Secondary | ICD-10-CM

## 2023-10-25 DIAGNOSIS — J301 Allergic rhinitis due to pollen: Secondary | ICD-10-CM

## 2023-10-25 DIAGNOSIS — R7303 Prediabetes: Secondary | ICD-10-CM

## 2023-10-25 DIAGNOSIS — R0602 Shortness of breath: Secondary | ICD-10-CM | POA: Insufficient documentation

## 2023-10-25 DIAGNOSIS — F419 Anxiety disorder, unspecified: Secondary | ICD-10-CM

## 2023-10-25 DIAGNOSIS — K219 Gastro-esophageal reflux disease without esophagitis: Secondary | ICD-10-CM

## 2023-10-25 DIAGNOSIS — R41840 Attention and concentration deficit: Secondary | ICD-10-CM

## 2023-10-25 NOTE — Telephone Encounter (Signed)
 Copied from CRM [id number]. Topic: Clinical - Red Word Triage >> Oct 25, 2023  7:38 AM Theodis Sato wrote: Red Word that prompted transfer to Nurse Triage: Shortness of breath  Chief Complaint: difficulty breathing Symptoms: SOB at times that comes and goes Frequency: couple of months but getting more frequent Pertinent Negatives: Patient denies chest pain Disposition: [] ED /[] Urgent Care (no appt availability in office) / [] Appointment(In office/virtual)/ [x]  [facility]/ [] Home Care/ [] Refused Recommended Disposition /[] [facility] [facility]/ []  Follow-up with PCP Additional Notes: pt thinks SOB could be related to abruptly stopped taking buprpion because did not like the way it made her feels  or seasonal asthmas.  This has been going on for a couple of months - it comes and goes but is getting more frequent  Reason for Disposition  [1] MODERATE longstanding difficulty breathing (e.g., speaks in phrases, SOB even at rest, pulse 100-120) AND [2] SAME as normal  Answer Assessment - Initial Assessment Questions 1. RESPIRATORY STATUS: "Describe your breathing?" (e.g., wheezing, shortness of breath, unable to speak, severe coughing)      SOB at times: chest does not feel clear when SOB & have to cough to clear i 2. ONSET: "When did this breathing problem begin?"      Couple of months 3. PATTERN "Does the difficult breathing come and go, or has it been constant since it started?"      Comes and goes 4. SEVERITY: "How bad is your breathing?" (e.g., mild, moderate, severe)    - MILD: No SOB at rest, mild SOB with walking, speaks normally in sentences, can lie down, no retractions, pulse < 100.    - MODERATE: SOB at rest, SOB with minimal exertion and prefers to sit, cannot lie down flat, speaks in phrases, mild retractions, audible wheezing, pulse 100-120.    - SEVERE: Very SOB at rest, speaks in single words, struggling to breathe, sitting hunched forward, retractions, pulse > 120       moderate 5. RECURRENT SYMPTOM: "Have you had difficulty breathing before?" If Yes, ask: "When was the last time?" and "What happened that time?"      Yes seasonal asthma  6. CARDIAC HISTORY: "Do you have any history of heart disease?" (e.g., heart attack, angina, bypass surgery, angioplasty)      no 7. LUNG HISTORY: "Do you have any history of lung disease?"  (e.g., pulmonary embolus, asthma, emphysema)     asthma 8. CAUSE: "What do you think is causing the breathing problem?"      9. OTHER SYMPTOMS: "Do you have any other symptoms? (e.g., dizziness, runny nose, cough, chest pain, fever)     Sore throat about x 2weeks, hx of swollen lymph node alot 10. O2 SATURATION MONITOR:  "Do you use an oxygen saturation monitor (pulse oximeter) at home?" If Yes, ask: "What is your reading (oxygen level) today?" "What is your usual oxygen saturation reading?" (e.g., 95%)       N/a 11. PREGNANCY: "Is there any chance you are pregnant?" "When was your last menstrual period?"       N/a 12. TRAVEL: "Have you traveled out of the country in the last month?" (e.g., travel history, exposures)       N/a  Protocols used: Breathing Difficulty-A-AH

## 2023-10-25 NOTE — Assessment & Plan Note (Signed)
 Chronic Not ideally controlled Continue wellbutrin 150 mg xl daily

## 2023-10-25 NOTE — Assessment & Plan Note (Signed)
 Chronic Lab Results  Component Value Date   HGBA1C 6.1 04/26/2023   Check a1c Stressed regular exercise, weight loss and diabetic diet

## 2023-10-25 NOTE — Progress Notes (Unsigned)
 Subjective:    Patient ID: Brenda [name], female    DOB: Oct 10, 1985, 38 y.o.   MRN: [id number]     HPI Brenda [name] is here for follow up of her chronic medical problems.  ADHD-has been taking Wellbutrin 150 mg daily  Ck A1c  SOB at times  Medications and allergies reviewed with patient and updated if appropriate.  Current Outpatient Medications on File Prior to Visit  Medication Sig Dispense Refill   albuterol (VENTOLIN HFA) 108 (90 Base) MCG/ACT inhaler INHALE 1 TO 2 PUFFS INTO THE LUNGS EVERY 6 HOURS AS NEEDED FOR WHEEZING OR SHORTNESS OF BREATH. 8.5 g 5   buPROPion (WELLBUTRIN XL) 150 MG 24 hr tablet TAKE 1 TABLET(150 MG) BY MOUTH DAILY 90 tablet 1   escitalopram (LEXAPRO) 10 MG tablet TAKE 1 TABLET(10 MG) BY MOUTH DAILY 90 tablet 1   fluticasone (FLONASE) 50 MCG/ACT nasal spray Place 2 sprays into both nostrils daily. (Patient taking differently: Place 2 sprays into both nostrils daily as needed for allergies.) 16 g 6   ibuprofen (ADVIL) 600 MG tablet Take 1 tablet (600 mg total) by mouth every 6 (six) hours as needed. 30 tablet 1   loratadine (CLARITIN) 10 MG tablet Take 1 tablet (10 mg total) by mouth daily as needed for allergies. (Patient taking differently: Take 10 mg by mouth daily.) 90 tablet 1   medroxyPROGESTERone (PROVERA) 10 MG tablet Take 10 mg by mouth daily.     metFORMIN (GLUCOPHAGE-XR) 500 MG 24 hr tablet Take 1 tablet (500 mg total) by mouth daily. Take with food 90 tablet 1   methocarbamol (ROBAXIN) 500 MG tablet Take 1 tablet (500 mg total) by mouth every 6 (six) hours as needed for muscle spasms. 30 tablet 0   montelukast (SINGULAIR) 10 MG tablet TAKE 1 TABLET BY MOUTH EVERYDAY AT BEDTIME (Patient taking differently: Take 10 mg by mouth daily as needed (allergies).) 90 tablet 1   pantoprazole (PROTONIX) 40 MG tablet TAKE 1 TABLET BY MOUTH EVERY DAY 90 tablet 1   tacrolimus (PROTOPIC) 0.1 % ointment Apply topically 2 (two) times daily as needed on face. 30  g 0   triamcinolone cream (KENALOG) 0.1 % Apply 1 Application topically 2 (two) times daily. 30 g 2   No current facility-administered medications on file prior to visit.     Review of Systems     Objective:  There were no vitals filed for this visit. BP Readings from Last 3 Encounters:  04/26/23 130/88  11/05/22 130/78  09/17/22 124/82   Wt Readings from Last 3 Encounters:  04/26/23 257 lb (116.6 kg)  11/05/22 250 lb (113.4 kg)  09/17/22 250 lb 12.8 oz (113.8 kg)   There is no height or weight on file to calculate BMI.    Physical Exam     Lab Results  Component Value Date   WBC 3.2 (L) 09/04/2022   HGB 12.3 09/04/2022   HCT 38.2 09/04/2022   PLT 255.0 09/04/2022   GLUCOSE 81 04/26/2023   CHOL 131 02/01/2017   TRIG 62.0 02/01/2017   HDL 36.00 (L) 02/01/2017   LDLCALC 82 02/01/2017   ALT 13 04/26/2023   AST 13 04/26/2023   NA 136 04/26/2023   K 4.1 04/26/2023   CL 102 04/26/2023   CREATININE 1.06 04/26/2023   BUN 14 04/26/2023   CO2 25 04/26/2023   TSH 0.68 09/01/2021   HGBA1C 6.1 04/26/2023     Assessment & Plan:  See Problem List for Assessment and Plan of chronic medical problems.

## 2023-10-25 NOTE — Assessment & Plan Note (Signed)
Chronic Overall controlled Continue singular 10 mg nightly, albuterol inhaler as needed Continue Claritin, Flonase

## 2023-10-25 NOTE — Assessment & Plan Note (Signed)
 Chronic Following with GYN Continue metformin XR 500 mg with breakfast

## 2023-10-25 NOTE — Patient Instructions (Incomplete)
      Blood work was ordered.       Medications changes include :   None    A referral was ordered and someone will call you to schedule an appointment.     No follow-ups on file.

## 2023-10-25 NOTE — Assessment & Plan Note (Signed)
Chronic Controlled, Stable Continue Lexapro 10 mg daily, bupropion XL 150 mg daily

## 2023-10-25 NOTE — Assessment & Plan Note (Signed)
 Chronic GERD controlled Continue pantoprazole 40 mg daily

## 2023-10-25 NOTE — Assessment & Plan Note (Signed)
 Chronic Controlled Continue Wellbutrin XL 150 mg daily and Lexapro 10 mg daily

## 2023-10-26 ENCOUNTER — Encounter: Payer: Self-pay | Admitting: Internal Medicine

## 2023-10-26 ENCOUNTER — Ambulatory Visit (INDEPENDENT_AMBULATORY_CARE_PROVIDER_SITE_OTHER): Admitting: Internal Medicine

## 2023-10-26 VITALS — BP 134/90 | HR 78 | Temp 97.7°F | Ht 63.0 in | Wt 265.0 lb

## 2023-10-26 DIAGNOSIS — J45998 Other asthma: Secondary | ICD-10-CM

## 2023-10-26 DIAGNOSIS — F419 Anxiety disorder, unspecified: Secondary | ICD-10-CM

## 2023-10-26 DIAGNOSIS — R7303 Prediabetes: Secondary | ICD-10-CM | POA: Diagnosis not present

## 2023-10-26 DIAGNOSIS — I10 Essential (primary) hypertension: Secondary | ICD-10-CM | POA: Insufficient documentation

## 2023-10-26 DIAGNOSIS — K219 Gastro-esophageal reflux disease without esophagitis: Secondary | ICD-10-CM

## 2023-10-26 DIAGNOSIS — F341 Dysthymic disorder: Secondary | ICD-10-CM

## 2023-10-26 DIAGNOSIS — R41840 Attention and concentration deficit: Secondary | ICD-10-CM

## 2023-10-26 DIAGNOSIS — J301 Allergic rhinitis due to pollen: Secondary | ICD-10-CM | POA: Diagnosis not present

## 2023-10-26 DIAGNOSIS — E282 Polycystic ovarian syndrome: Secondary | ICD-10-CM | POA: Diagnosis not present

## 2023-10-26 DIAGNOSIS — R0602 Shortness of breath: Secondary | ICD-10-CM

## 2023-10-26 MED ORDER — VALSARTAN 40 MG PO TABS: 40.0000 mg | ORAL_TABLET | Freq: Every day | ORAL | 3 refills | Status: DC

## 2023-10-26 MED ORDER — FLUTICASONE PROPIONATE 50 MCG/ACT NA SUSP: 2.0000 | Freq: Every day | NASAL | 6 refills | Status: AC

## 2023-10-26 MED ORDER — ALBUTEROL SULFATE HFA 108 (90 BASE) MCG/ACT IN AERS
1.0000 | INHALATION_SPRAY | Freq: Four times a day (QID) | RESPIRATORY_TRACT | 8 refills | Status: AC | PRN
Start: 2023-10-26 — End: ?

## 2023-10-26 MED ORDER — PANTOPRAZOLE SODIUM 40 MG PO TBEC: 40.0000 mg | DELAYED_RELEASE_TABLET | Freq: Every day | ORAL | 1 refills | Status: DC

## 2023-10-26 MED ORDER — LORATADINE 10 MG PO TABS: 10.0000 mg | ORAL_TABLET | Freq: Every day | ORAL | 1 refills | Status: AC | PRN

## 2023-10-26 NOTE — Assessment & Plan Note (Signed)
Chronic Mild, intermittent Continue albuterol as needed

## 2023-10-26 NOTE — Assessment & Plan Note (Signed)
 Chronic Lab Results  Component Value Date   HGBA1C 6.1 04/26/2023   Will check A1c at next visit Stressed regular exercise, weight loss and diabetic diet

## 2023-10-26 NOTE — Assessment & Plan Note (Addendum)
 New Started about one month ago Occurs at rest - associated with a funny feeling in chest, dizziness and not feeling well - associated with increased stress Occurs occasionally - not daily Deferred medication Start exercise, stress management F/u in one month   EKG - NSR at 85 bpm, normal EKG. Compared to EKG from 2023 sinus arrhythmia and nonspecific t wave abnormalities are no longer present

## 2023-10-26 NOTE — Assessment & Plan Note (Signed)
 New BP elevated on more than one occasion Start valsartan 40 mg daily Start low sodium diet - currently eating a high sodium diet Start regular exercise Work on weight loss F/u 1 months

## 2023-10-26 NOTE — Assessment & Plan Note (Signed)
 Chronic Was on wellbutrin 150 mg daily - did not help much Feels it is affecting her effectiveness at work and is frustrating Would like to be evaluated Referral to behavioral health

## 2023-10-26 NOTE — Assessment & Plan Note (Signed)
 Chronic Having some anxiety but feels ok off medication Will start exercising and eating better Has been taking a supplement that may help Does not want to restart medication Symptoms she is having coincides with anxiety - monitor F/u in one month

## 2023-10-26 NOTE — Assessment & Plan Note (Signed)
 Chronic Having increased allergy  symptoms Start claritin, flonase

## 2023-10-26 NOTE — Assessment & Plan Note (Signed)
 Chronic GERD controlled Continue pantoprazole 40 mg daily

## 2023-10-26 NOTE — Assessment & Plan Note (Signed)
 Chronic She is frustrated by her weight Discussed importance of exercise - started walking Eating twice a day - breakfast is a sausage, egg and cheese biscuit, fries and soda from [employer] - total of 1080 cal Advised decreasing salt, sugar and calories in her diet Deferred nutrition referral Will start using myfitnesspal

## 2023-10-26 NOTE — Assessment & Plan Note (Signed)
 Chronic Mild Controlled w/o medication Start regular exercise

## 2023-10-26 NOTE — Assessment & Plan Note (Signed)
 Chronic Following with GYN Not currently taking metformin XR 500 mg - could try restarting to see if it helps with her appetite

## 2023-10-27 ENCOUNTER — Encounter: Payer: Self-pay | Admitting: Internal Medicine

## 2023-10-27 DIAGNOSIS — R41840 Attention and concentration deficit: Secondary | ICD-10-CM

## 2023-10-28 ENCOUNTER — Ambulatory Visit: Admitting: Internal Medicine

## 2023-11-07 ENCOUNTER — Other Ambulatory Visit: Payer: Self-pay | Admitting: Internal Medicine

## 2024-01-04 ENCOUNTER — Encounter: Payer: Self-pay | Admitting: *Deleted

## 2024-01-31 ENCOUNTER — Encounter: Payer: Self-pay | Admitting: Internal Medicine

## 2024-02-02 ENCOUNTER — Encounter: Payer: Self-pay | Admitting: Internal Medicine

## 2024-02-02 DIAGNOSIS — G4733 Obstructive sleep apnea (adult) (pediatric): Secondary | ICD-10-CM | POA: Insufficient documentation

## 2024-02-02 NOTE — Patient Instructions (Incomplete)
      Blood work was ordered.       Medications changes include :   None    A referral was ordered and someone will call you to schedule an appointment.     No follow-ups on file.

## 2024-02-02 NOTE — Progress Notes (Unsigned)
 Subjective:    Patient ID: Brenda [name], female    DOB: 01-25-1986, 38 y.o.   MRN: [id number]     HPI Rahcel is here for follow up of her chronic medical problems.  Htn - yesterday BP was 154/81.   She takes her medication daily.  Watches sodium level.   Exercise - inconsistently gong to gym.    Has cut back on soft drinks.  Not eating great  - eating less now that it is hot.    Weight loss - wonders about medication  Sleep apnea -diagnosed 10/2022 with severe sleep apnea-started on CPAP.  She does not like the cpap and is not using it - takes it off in her sleep.  She has an appt w/ Dr Buck soon.  She works 10:45  am -  7:15 pm  Medications and allergies reviewed with patient and updated if appropriate.  Current Outpatient Medications on File Prior to Visit  Medication Sig Dispense Refill   valACYclovir  (VALTREX ) 500 MG tablet Take 500 mg by mouth daily.     albuterol  (VENTOLIN  HFA) 108 (90 Base) MCG/ACT inhaler Inhale 1-2 puffs into the lungs every 6 (six) hours as needed for wheezing or shortness of breath. 8.5 g 8   fluticasone  (FLONASE ) 50 MCG/ACT nasal spray Place 2 sprays into both nostrils daily. 16 g 6   loratadine  (CLARITIN ) 10 MG tablet Take 1 tablet (10 mg total) by mouth daily as needed for allergies. 90 tablet 1   medroxyPROGESTERone (PROVERA) 10 MG tablet Take 10 mg by mouth daily.     methocarbamol  (ROBAXIN ) 500 MG tablet Take 1 tablet (500 mg total) by mouth every 6 (six) hours as needed for muscle spasms. 30 tablet 0   montelukast  (SINGULAIR ) 10 MG tablet TAKE 1 TABLET BY MOUTH EVERYDAY AT BEDTIME (Patient taking differently: Take 10 mg by mouth daily as needed (allergies).) 90 tablet 1   pantoprazole  (PROTONIX ) 40 MG tablet Take 1 tablet (40 mg total) by mouth daily. 90 tablet 1   tacrolimus  (PROTOPIC ) 0.1 % ointment Apply topically 2 (two) times daily as needed on face. 30 g 0   triamcinolone  cream (KENALOG ) 0.1 % Apply 1 Application topically 2 (two)  times daily. 30 g 2   valsartan  (DIOVAN ) 40 MG tablet Take 1 tablet (40 mg total) by mouth daily. 30 tablet 3   No current facility-administered medications on file prior to visit.     Review of Systems  Eyes:  Negative for visual disturbance.  Respiratory:  Positive for shortness of breath (little).   Cardiovascular:  Negative for chest pain, palpitations (occ - chronic - not new) and leg swelling.  Neurological:  Positive for headaches (yesterday). Negative for dizziness and light-headedness.       Objective:   Vitals:   02/03/24 0854  BP: (!) 146/86  Pulse: 78  Temp: 98.2 F (36.8 C)  SpO2: 98%   BP Readings from Last 3 Encounters:  02/03/24 (!) 146/86  10/26/23 (!) 134/90  04/26/23 130/88   Wt Readings from Last 3 Encounters:  02/03/24 264 lb (119.7 kg)  10/26/23 265 lb (120.2 kg)  04/26/23 257 lb (116.6 kg)   Body mass index is 46.77 kg/m.    Physical Exam     Lab Results  Component Value Date   WBC 3.2 (L) 09/04/2022   HGB 12.3 09/04/2022   HCT 38.2 09/04/2022   PLT 255.0 09/04/2022   GLUCOSE 81 04/26/2023   CHOL 131  02/01/2017   TRIG 62.0 02/01/2017   HDL 36.00 (L) 02/01/2017   LDLCALC 82 02/01/2017   ALT 13 04/26/2023   AST 13 04/26/2023   NA 136 04/26/2023   K 4.1 04/26/2023   CL 102 04/26/2023   CREATININE 1.06 04/26/2023   BUN 14 04/26/2023   CO2 25 04/26/2023   TSH 0.68 09/01/2021   HGBA1C 6.1 04/26/2023     Assessment & Plan:    See Problem List for Assessment and Plan of chronic medical problems.

## 2024-02-03 ENCOUNTER — Ambulatory Visit: Admitting: Internal Medicine

## 2024-02-03 ENCOUNTER — Telehealth: Payer: Self-pay

## 2024-02-03 ENCOUNTER — Other Ambulatory Visit (HOSPITAL_COMMUNITY): Payer: Self-pay

## 2024-02-03 VITALS — BP 138/78 | HR 78 | Temp 98.2°F | Ht 63.0 in | Wt 264.0 lb

## 2024-02-03 DIAGNOSIS — I10 Essential (primary) hypertension: Secondary | ICD-10-CM

## 2024-02-03 DIAGNOSIS — G4733 Obstructive sleep apnea (adult) (pediatric): Secondary | ICD-10-CM

## 2024-02-03 DIAGNOSIS — R7303 Prediabetes: Secondary | ICD-10-CM

## 2024-02-03 MED ORDER — ZEPBOUND 2.5 MG/0.5ML ~~LOC~~ SOAJ: 2.5000 mg | SUBCUTANEOUS | 0 refills | Status: DC

## 2024-02-03 MED ORDER — HYDROCHLOROTHIAZIDE 25 MG PO TABS: 25.0000 mg | ORAL_TABLET | Freq: Every day | ORAL | 3 refills | Status: AC

## 2024-02-03 NOTE — Assessment & Plan Note (Signed)
 Chronic Not ideally controlled Continue valsartan  40 mg daily Start HCTZ 25 mg daily Monitor BP at home-she just purchased a cough Discussed the importance of low-sodium diet, regular exercise and weight loss Discussed consequences of uncontrolled hypertension

## 2024-02-03 NOTE — Assessment & Plan Note (Signed)
 Chronic BMI 46.77 Comorbidities of hypertension, prediabetes, sleep apnea, GERD Stressed regular exercise - goal 4 - 5 days a week Discussed healthy diet-advised high-protein, high vegetables-low carbs and sugars Start Zepbound 2.5 mg weekly

## 2024-02-03 NOTE — Telephone Encounter (Signed)
 Pharmacy Patient Advocate Encounter  Received notification from [employer] that Prior Authorization for Zepbound 2.5mg /0.27ml has been DENIED.  See denial reason below. No denial letter attached in CMM. Will attach denial letter to Media tab once received.   PA #/Case ID/Reference #: [id number]

## 2024-02-03 NOTE — Assessment & Plan Note (Signed)
 Chronic Lab Results  Component Value Date   HGBA1C 6.1 04/26/2023   Low sugar / carb diet Stressed regular exercise Working on weight loss

## 2024-02-03 NOTE — Telephone Encounter (Signed)
 Pharmacy Patient Advocate Encounter   Received notification from Patient Advice Request messages that prior authorization for Zepbound 2.5mg /0.70ml is required/requested.   Insurance verification completed.   The patient is insured through [facility] .   Per test claim: PA required; PA submitted to above mentioned insurance via CoverMyMeds Key/confirmation #/EOC [id number] Status is pending

## 2024-02-03 NOTE — Telephone Encounter (Signed)
 Pharmacy Patient Advocate Encounter   Received notification from Patient Advice Request messages that prior authorization for Zepbound 2.5mg /0.64ml is required/requested.   Insurance verification completed.   The patient is insured through [facility] .   Per test claim: PA required; PA submitted to above mentioned insurance via CoverMyMeds Key/confirmation #/EOC [id number] Status is pending

## 2024-02-03 NOTE — Assessment & Plan Note (Signed)
 Chronic Has CPAP Does not tolerate it and typically takes it off in the middle of the night Does have fatigue, difficulty losing weight Has appointment to see Dr. Buck who is monitoring her sleep apnea

## 2024-02-04 NOTE — Telephone Encounter (Signed)
 Pharmacy Patient Advocate Encounter  Received notification from [facility] that Prior Authorization for Zepbound  2.5mg /0.53ml has been DENIED.  No reason given; No denial letter received via Fax or CMM. It has been requested and will be uploaded to the media tab once received.

## 2024-02-07 NOTE — Telephone Encounter (Signed)
 Addressed.

## 2024-02-08 ENCOUNTER — Telehealth: Payer: Self-pay | Admitting: Internal Medicine

## 2024-02-08 ENCOUNTER — Other Ambulatory Visit (HOSPITAL_COMMUNITY): Payer: Self-pay

## 2024-02-08 ENCOUNTER — Telehealth: Payer: Self-pay

## 2024-02-08 MED ORDER — WEGOVY 0.25 MG/0.5ML ~~LOC~~ SOAJ: 0.2500 mg | SUBCUTANEOUS | 0 refills | Status: DC

## 2024-02-08 NOTE — Telephone Encounter (Signed)
 Patient already informed of denial.  Waiting for Dr. Geofm to advise regarding trying [facility].

## 2024-02-08 NOTE — Telephone Encounter (Signed)
 Please see encounter 02/04/24.

## 2024-02-08 NOTE — Telephone Encounter (Signed)
 Zepbound  not covered by her insurance, but they will cover Wegovy.  [facility] sent to pharmacy for the lowest dose-can increase monthly as tolerated.

## 2024-02-14 ENCOUNTER — Encounter: Payer: Self-pay | Admitting: Internal Medicine

## 2024-02-14 ENCOUNTER — Telehealth: Payer: Self-pay

## 2024-02-14 ENCOUNTER — Other Ambulatory Visit (HOSPITAL_COMMUNITY): Payer: Self-pay

## 2024-02-14 NOTE — Telephone Encounter (Signed)
 Pharmacy Patient Advocate Encounter   Received notification from CoverMyMeds that prior authorization for Wegovy  0.25MG /0.5ML auto-injectorsis required/requested.   Insurance verification completed.   The patient is insured through [facility] .   Per test claim: PA required; PA submitted to above mentioned insurance via CoverMyMeds Key/confirmation #/EOC (Key: [id number])   Status is pending

## 2024-02-14 NOTE — Telephone Encounter (Signed)
 Please note that I have submitted to the Primary Plan first In order for the pts (secondary plan/medicaid) to cover Rx    Pharmacy Patient Advocate Encounter   Received notification from CoverMyMeds that prior authorization for Wegovy  0.25MG /0.5ML auto-injectorsis required/requested.   Insurance verification completed.   The patient is insured through [facility] .   Per test claim: PA required; PA submitted to above mentioned insurance via CoverMyMeds Key/confirmation #/EOC (Key: [id number])   Status is pending

## 2024-02-16 ENCOUNTER — Other Ambulatory Visit (HOSPITAL_COMMUNITY): Payer: Self-pay

## 2024-02-16 NOTE — Telephone Encounter (Signed)
 Resubmitted to insurance with addt information.   (Key: [id number])

## 2024-02-16 NOTE — Telephone Encounter (Signed)
 Pharmacy Patient Advocate Encounter  Received notification from [facility] that Prior Authorization forWegovy 0.25MG /0.5ML auto-injectors has been APPROVED from 7.9.25 to 1.9.26. Ran test claim, Copay is $4.00. This test claim was processed through [facility]- copay amounts may vary at other pharmacies due to pharmacy/plan contracts, or as the patient moves through the different stages of their insurance plan.   PA #/Case ID/Reference #: (Key: [id number])

## 2024-02-16 NOTE — Telephone Encounter (Signed)
 Patient notified via mychart

## 2024-02-18 ENCOUNTER — Other Ambulatory Visit: Payer: Self-pay

## 2024-02-18 ENCOUNTER — Other Ambulatory Visit (HOSPITAL_COMMUNITY): Payer: Self-pay

## 2024-02-18 DIAGNOSIS — E669 Obesity, unspecified: Secondary | ICD-10-CM

## 2024-02-18 MED ORDER — WEGOVY 0.25 MG/0.5ML ~~LOC~~ SOAJ
0.2500 mg | SUBCUTANEOUS | 0 refills | Status: DC
  Filled 2024-02-18 – 2024-02-21 (×2): qty 2, 28d supply, fill #0

## 2024-02-18 NOTE — Telephone Encounter (Unsigned)
 Copied from CRM [id number]. Topic: Clinical - Medication Question >> Feb 18, 2024  9:39 AM Viola F wrote: Reason for CRM: Patient WeGovy  0.25mg  medicaiton prior authorization was approved and patient would like it sent to the [facility]. Please call her with an update at [phone]

## 2024-02-18 NOTE — Telephone Encounter (Signed)
 Spoke with patient and refill sent to [facility] pharmacy at [facility] location .

## 2024-02-21 ENCOUNTER — Other Ambulatory Visit (HOSPITAL_COMMUNITY): Payer: Self-pay

## 2024-03-01 NOTE — Progress Notes (Unsigned)
 Subjective:    Patient ID: Brenda [name], female    DOB: 17-Jun-1986, 38 y.o.   MRN: [id number]     HPI Brenda [name] is here for follow up of her chronic medical problems.  She is here for follow-up of hypertension-restarted HCTZ 25 mg 4 weeks ago.  Has not been checking BP at home  She also started Wegovy  0.25 mg earlier this month - she has had two doses so far.   Her appetite is starting to decrease.  She is starting to feel better.    Has some pain in her left foot.  Has very flat arches and thinks that is what it is.  Knows she probably needs to wear better footwear.  Think she probably needs to see a foot doctor at some point.  Has pain even sitting.  Bilateral ear pressure and discomfort.  Sometimes headaches.  Usually first thing in the morning.  She does wear CPAP about 4 hours.  She thinks she may clench.   Medications and allergies reviewed with patient and updated if appropriate.  Current Outpatient Medications on File Prior to Visit  Medication Sig Dispense Refill   albuterol  (VENTOLIN  HFA) 108 (90 Base) MCG/ACT inhaler Inhale 1-2 puffs into the lungs every 6 (six) hours as needed for wheezing or shortness of breath. 8.5 g 8   fluticasone  (FLONASE ) 50 MCG/ACT nasal spray Place 2 sprays into both nostrils daily. 16 g 6   hydrochlorothiazide  (HYDRODIURIL ) 25 MG tablet Take 1 tablet (25 mg total) by mouth daily. 90 tablet 3   loratadine  (CLARITIN ) 10 MG tablet Take 1 tablet (10 mg total) by mouth daily as needed for allergies. 90 tablet 1   medroxyPROGESTERone (PROVERA) 10 MG tablet Take 10 mg by mouth daily.     methocarbamol  (ROBAXIN ) 500 MG tablet Take 1 tablet (500 mg total) by mouth every 6 (six) hours as needed for muscle spasms. 30 tablet 0   montelukast  (SINGULAIR ) 10 MG tablet TAKE 1 TABLET BY MOUTH EVERYDAY AT BEDTIME (Patient taking differently: Take 10 mg by mouth daily as needed (allergies).) 90 tablet 1   pantoprazole  (PROTONIX ) 40 MG tablet Take 1 tablet  (40 mg total) by mouth daily. 90 tablet 1   Semaglutide -Weight Management (WEGOVY ) 0.25 MG/0.5ML SOAJ Inject 0.25 mg into the skin once a week. 2 mL 0   tacrolimus  (PROTOPIC ) 0.1 % ointment Apply topically 2 (two) times daily as needed on face. 30 g 0   triamcinolone  cream (KENALOG ) 0.1 % Apply 1 Application topically 2 (two) times daily. 30 g 2   valACYclovir  (VALTREX ) 500 MG tablet Take 500 mg by mouth daily.     valsartan  (DIOVAN ) 40 MG tablet Take 1 tablet (40 mg total) by mouth daily. 30 tablet 3   No current facility-administered medications on file prior to visit.     Review of Systems  Respiratory:  Negative for shortness of breath.   Cardiovascular:  Negative for chest pain and palpitations.  Gastrointestinal:  Negative for constipation, diarrhea and nausea.  Neurological:  Negative for light-headedness and headaches.       Objective:   Vitals:   03/02/24 0939  BP: 132/84  Pulse: 76  Temp: 98.2 F (36.8 C)  SpO2: 98%   BP Readings from Last 3 Encounters:  03/02/24 132/84  02/03/24 138/78  10/26/23 (!) 134/90   Wt Readings from Last 3 Encounters:  03/02/24 266 lb (120.7 kg)  02/03/24 264 lb (119.7 kg)  10/26/23 265 lb (  120.2 kg)   Body mass index is 47.12 kg/m.    Physical Exam Constitutional:      General: She is not in acute distress.    Appearance: Normal appearance. She is not ill-appearing.  HENT:     Head: Normocephalic and atraumatic.     Right Ear: Tympanic membrane, ear canal and external ear normal. There is no impacted cerumen.     Left Ear: Tympanic membrane, ear canal and external ear normal. There is no impacted cerumen.  Skin:    General: Skin is warm and dry.  Neurological:     Mental Status: She is alert.        Lab Results  Component Value Date   WBC 3.2 (L) 09/04/2022   HGB 12.3 09/04/2022   HCT 38.2 09/04/2022   PLT 255.0 09/04/2022   GLUCOSE 81 04/26/2023   CHOL 131 02/01/2017   TRIG 62.0 02/01/2017   HDL 36.00 (L)  02/01/2017   LDLCALC 82 02/01/2017   ALT 13 04/26/2023   AST 13 04/26/2023   NA 136 04/26/2023   K 4.1 04/26/2023   CL 102 04/26/2023   CREATININE 1.06 04/26/2023   BUN 14 04/26/2023   CO2 25 04/26/2023   TSH 0.68 09/01/2021   HGBA1C 6.1 04/26/2023     Assessment & Plan:    See Problem List for Assessment and Plan of chronic medical problems.

## 2024-03-01 NOTE — Patient Instructions (Addendum)
      Blood work was ordered.       Medications changes include :   None    A referral was ordered and someone will call you to schedule an appointment.     Return in about 2 months (around 05/03/2024) for follow up .

## 2024-03-02 ENCOUNTER — Encounter: Payer: Self-pay | Admitting: Internal Medicine

## 2024-03-02 ENCOUNTER — Ambulatory Visit: Payer: Self-pay | Admitting: Internal Medicine

## 2024-03-02 ENCOUNTER — Ambulatory Visit (INDEPENDENT_AMBULATORY_CARE_PROVIDER_SITE_OTHER): Admitting: Internal Medicine

## 2024-03-02 VITALS — BP 132/84 | HR 76 | Temp 98.2°F | Ht 63.0 in | Wt 266.0 lb

## 2024-03-02 DIAGNOSIS — I10 Essential (primary) hypertension: Secondary | ICD-10-CM

## 2024-03-02 DIAGNOSIS — E669 Obesity, unspecified: Secondary | ICD-10-CM

## 2024-03-02 DIAGNOSIS — M79672 Pain in left foot: Secondary | ICD-10-CM

## 2024-03-02 DIAGNOSIS — K219 Gastro-esophageal reflux disease without esophagitis: Secondary | ICD-10-CM

## 2024-03-02 DIAGNOSIS — R7303 Prediabetes: Secondary | ICD-10-CM | POA: Diagnosis not present

## 2024-03-02 DIAGNOSIS — H938X3 Other specified disorders of ear, bilateral: Secondary | ICD-10-CM

## 2024-03-02 LAB — COMPREHENSIVE METABOLIC PANEL WITH GFR
ALT: 24 U/L (ref 0–35)
AST: 15 U/L (ref 0–37)
Albumin: 4.3 g/dL (ref 3.5–5.2)
Alkaline Phosphatase: 33 U/L — ABNORMAL LOW (ref 39–117)
BUN: 12 mg/dL (ref 6–23)
CO2: 31 meq/L (ref 19–32)
Calcium: 9.5 mg/dL (ref 8.4–10.5)
Chloride: 100 meq/L (ref 96–112)
Creatinine, Ser: 0.98 mg/dL (ref 0.40–1.20)
GFR: 74.64 mL/min (ref >60.00)
Glucose, Bld: 90 mg/dL (ref 70–99)
Potassium: 3.8 meq/L (ref 3.5–5.1)
Sodium: 138 meq/L (ref 135–145)
Total Bilirubin: 0.3 mg/dL (ref 0.2–1.2)
Total Protein: 7.6 g/dL (ref 6.0–8.3)

## 2024-03-02 LAB — HEMOGLOBIN A1C: Hgb A1c MFr Bld: 6.5 % (ref 4.6–6.5)

## 2024-03-02 LAB — LIPID PANEL
Cholesterol: 179 mg/dL (ref 0–200)
HDL: 40.4 mg/dL (ref >39.00)
LDL Cholesterol: 121 mg/dL — ABNORMAL HIGH (ref 0–99)
NonHDL: 138.54
Total CHOL/HDL Ratio: 4
Triglycerides: 88 mg/dL (ref 0.0–149.0)
VLDL: 17.6 mg/dL (ref 0.0–40.0)

## 2024-03-02 NOTE — Assessment & Plan Note (Signed)
 Chronic Initial weight prior to medication (02/03/24)  264 pounds Working on weight loss Stressed regular exercise, diet high in protein, fiber and vegetables-smaller portions On Wegovy  0.25 mg weekly-tolerating this well without side effects Continue Wegovy  and titrate dose as tolerated

## 2024-03-02 NOTE — Assessment & Plan Note (Signed)
 Chronic Lab Results  Component Value Date   HGBA1C 6.1 04/26/2023   Check A1c Low sugar / carb diet Stressed regular exercise Working on weight loss

## 2024-03-02 NOTE — Assessment & Plan Note (Signed)
 Chronic GERD controlled Continue pantoprazole 40 mg daily

## 2024-03-02 NOTE — Assessment & Plan Note (Signed)
 New Has left lateral ankle pain Has flatfeet Does not always wear the best footwear Can have pain at rest Discussed getting better footwear Weight loss will hopefully help some Discussed seeing podiatrist in the future

## 2024-03-02 NOTE — Assessment & Plan Note (Signed)
 Chronic Controlled Continue valsartan  40 mg daily, HCTZ 25 mg daily Monitor BP at home Discussed the importance of low-sodium diet, regular exercise and weight loss

## 2024-03-02 NOTE — Assessment & Plan Note (Signed)
 Acute Associated with some headaches in the morning Ear exam normal ?  TMJ/clenching ?  OSA-she is using her CPAP machine but only about 4 hours a night She will monitor and let me know if this does not improve

## 2024-03-13 ENCOUNTER — Other Ambulatory Visit: Payer: Self-pay | Admitting: Internal Medicine

## 2024-03-13 DIAGNOSIS — E669 Obesity, unspecified: Secondary | ICD-10-CM

## 2024-03-14 ENCOUNTER — Other Ambulatory Visit (HOSPITAL_COMMUNITY): Payer: Self-pay

## 2024-03-14 MED ORDER — WEGOVY 0.25 MG/0.5ML ~~LOC~~ SOAJ
0.2500 mg | SUBCUTANEOUS | 0 refills | Status: DC
Start: 2024-03-14 — End: 2024-05-29
  Filled 2024-03-14: qty 2, 28d supply, fill #0

## 2024-03-15 ENCOUNTER — Other Ambulatory Visit (HOSPITAL_COMMUNITY): Payer: Self-pay

## 2024-03-15 ENCOUNTER — Other Ambulatory Visit: Payer: Self-pay

## 2024-03-15 DIAGNOSIS — E669 Obesity, unspecified: Secondary | ICD-10-CM

## 2024-03-15 MED ORDER — SEMAGLUTIDE-WEIGHT MANAGEMENT 0.5 MG/0.5ML ~~LOC~~ SOAJ
0.5000 mg | SUBCUTANEOUS | 0 refills | Status: DC
  Filled 2024-03-15 – 2024-04-17 (×3): qty 2, 28d supply, fill #0

## 2024-03-16 ENCOUNTER — Other Ambulatory Visit: Payer: Self-pay | Admitting: Internal Medicine

## 2024-03-20 ENCOUNTER — Other Ambulatory Visit: Payer: Self-pay

## 2024-03-29 ENCOUNTER — Telehealth: Payer: Self-pay | Admitting: Neurology

## 2024-03-29 ENCOUNTER — Encounter: Payer: Self-pay | Admitting: Neurology

## 2024-03-29 ENCOUNTER — Ambulatory Visit: Admitting: Neurology

## 2024-03-29 NOTE — Telephone Encounter (Signed)
 NS for sleep FU.

## 2024-04-04 ENCOUNTER — Other Ambulatory Visit (HOSPITAL_COMMUNITY): Payer: Self-pay

## 2024-04-17 ENCOUNTER — Other Ambulatory Visit (HOSPITAL_COMMUNITY): Payer: Self-pay

## 2024-05-08 ENCOUNTER — Other Ambulatory Visit: Payer: Self-pay | Admitting: Internal Medicine

## 2024-05-11 ENCOUNTER — Other Ambulatory Visit: Payer: Self-pay | Admitting: Internal Medicine

## 2024-05-11 ENCOUNTER — Other Ambulatory Visit (HOSPITAL_COMMUNITY): Payer: Self-pay

## 2024-05-11 DIAGNOSIS — E669 Obesity, unspecified: Secondary | ICD-10-CM

## 2024-05-12 ENCOUNTER — Other Ambulatory Visit (HOSPITAL_COMMUNITY): Payer: Self-pay

## 2024-05-12 MED ORDER — WEGOVY 0.5 MG/0.5ML ~~LOC~~ SOAJ
0.5000 mg | SUBCUTANEOUS | 0 refills | Status: DC
  Filled 2024-05-12 – 2024-05-14 (×2): qty 2, 28d supply, fill #0

## 2024-05-15 ENCOUNTER — Telehealth (HOSPITAL_COMMUNITY): Payer: Self-pay

## 2024-05-15 ENCOUNTER — Other Ambulatory Visit (HOSPITAL_COMMUNITY): Payer: Self-pay

## 2024-05-15 NOTE — Telephone Encounter (Signed)
 Pharmacy Patient Advocate Encounter   Received notification from Pt Calls Messages that prior authorization for Wegovy  0.5MG /0.5ML auto-injectors  is required/requested.   Insurance verification completed.   The patient is insured through [employer].   Per test claim: PA required; PA submitted to above mentioned insurance via Latent Key/confirmation #/EOC [id number] Status is pending

## 2024-05-15 NOTE — Telephone Encounter (Signed)
 PA request has been Received. New Encounter has been or will be created for follow up. For additional info see Pharmacy Prior Auth telephone encounter from 05/15/24.

## 2024-05-15 NOTE — Telephone Encounter (Signed)
 Pharmacy Patient Advocate Encounter   Received notification from Pt Calls Messages that prior authorization for Wegovy  0.5MG /0.5ML auto-injectors  is required/requested.   Insurance verification completed.   The patient is insured through [facility].   Per test claim: PA required; PA submitted to above mentioned insurance via Latent Key/confirmation #/EOC [id number] Status is pending   *[employer] denied PA because they need either a denial or approval form the primary insurance first. Once I get the denial/approval letter I will resubmit to [employer].

## 2024-05-16 NOTE — Telephone Encounter (Signed)
 Pharmacy Patient Advocate Encounter  Received notification from [employer] that Prior Authorization for Wegovy  0.5MG /0.5ML auto-injectors  has been DENIED.  See denial reason below. No denial letter attached in CMM. Will attach denial letter to Media tab once received.   PA #/Case ID/Reference #: [id number]     *I have resubmitted to [employer] with the denial letter from [employer]. Key # [id number]

## 2024-05-17 NOTE — Telephone Encounter (Signed)
 Pharmacy Patient Advocate Encounter  Received notification from [facility] [employer] that Prior Authorization for Wegovy  0.5MG /0.5ML auto-injectors  has been DENIED.  See denial reason below. No denial letter attached in CMM. Will attach denial letter to Media tab once received.   PA #/Case ID/Reference #: [id number]  *as of May 10, 2024, Medicaid is no longer covering Wegovy  unless it is for cardio protection. Zepbound  can possibly be covered with a prior auth for sleep apnea, which I see the patient has. We would need a sleep study documenting AHI of 15 or more to submit the prior auth. Please advise on what you would like to do.

## 2024-05-18 ENCOUNTER — Other Ambulatory Visit: Payer: Self-pay

## 2024-05-29 ENCOUNTER — Ambulatory Visit: Payer: Self-pay

## 2024-05-29 ENCOUNTER — Other Ambulatory Visit: Payer: Self-pay | Admitting: Family Medicine

## 2024-05-29 ENCOUNTER — Ambulatory Visit (INDEPENDENT_AMBULATORY_CARE_PROVIDER_SITE_OTHER): Admitting: Family Medicine

## 2024-05-29 ENCOUNTER — Encounter: Payer: Self-pay | Admitting: Family Medicine

## 2024-05-29 VITALS — BP 124/82 | HR 70 | Temp 98.2°F | Resp 18 | Ht 63.0 in | Wt 266.0 lb

## 2024-05-29 DIAGNOSIS — K580 Irritable bowel syndrome with diarrhea: Secondary | ICD-10-CM

## 2024-05-29 DIAGNOSIS — Z23 Encounter for immunization: Secondary | ICD-10-CM

## 2024-05-29 MED ORDER — DICYCLOMINE HCL 10 MG PO CAPS: 10.0000 mg | ORAL_CAPSULE | Freq: Three times a day (TID) | ORAL | 0 refills | Status: AC

## 2024-05-29 NOTE — Telephone Encounter (Signed)
 FYI Only or Action Required?: FYI only for provider.  Patient was last seen in primary care on 03/02/2024 by Geofm Glade PARAS, MD.  Called Nurse Triage reporting Abdominal Pain.  Symptoms began several weeks ago.  Interventions attempted: Prescription medications: pantoprazole  and Rest, hydration, or home remedies.  Symptoms are: gradually worsening.  Triage Disposition: See Physician Within 24 Hours  Patient/caregiver understands and will follow disposition?: Yes  Copied from CRM #[id number]. Topic: Clinical - Red Word Triage >> May 29, 2024  9:34 AM Eva FALCON wrote: Red Word that prompted transfer to Nurse Triage: stomach pain/change in bowel/couple of weeks/discomfort. Reason for Disposition  [1] MILD pain (e.g., does not interfere with normal activities) AND [2] pain comes and goes (cramps) AND [3] present > 48 hours  (Exception: This same abdominal pain is a chronic symptom recurrent or ongoing AND present > 4 weeks.)  Answer Assessment - Initial Assessment Questions Patient with ongoing stomach cramping and pain for the last few weeks. Top of her stomach. Feels like a growl in her throat but like a pain. Small bowel movements, increased urgency, and incomplete emptying.  Decrease in appetite. Rectum feels like a lot of pressure. IBS as a child Pantoprazole  for GERD  Appt this afternoon. ED/UC?Call back instruction given and understood.    1. LOCATION: Where does it hurt?      Top of stomach  2. RADIATION: Does the pain shoot anywhere else? (e.g., chest, back)     denies 3. ONSET: When did the pain begin? (e.g., minutes, hours or days ago)      A couple weeks ago- ignored for awhile due to Wegovy   4. SUDDEN: Gradual or sudden onset?     Gradual but happens randomly 5. PATTERN Does the pain come and go, or is it constant?     Coming and going- intermittent cramping 6. SEVERITY: How bad is the pain?  (e.g., Scale 1-10; mild, moderate, or severe)     One day it was a  8/10, but typically 5-6/10 pain  7. RECURRENT SYMPTOM: Have you ever had this type of stomach pain before? If Yes, ask: When was the last time? and What happened that time?      One time years ago- sensitive when she ate, like a burning.  8. CAUSE: What do you think is causing the stomach pain? (e.g., gallstones, recent abdominal surgery)     Unsure- IBS?? 9. RELIEVING/AGGRAVATING FACTORS: What makes it better or worse? (e.g., antacids, bending or twisting motion, bowel movement)     Ginger chew, or ginger ale  10. OTHER SYMPTOMS: Do you have any other symptoms? (e.g., back pain, diarrhea, fever, urination pain, vomiting)       Gasy, decreased appetite, incomplete emptying  11. PREGNANCY: Is there any chance you are pregnant? When was your last menstrual period?       Denies- bilateral tubal  Protocols used: Abdominal Pain - Female-A-AH

## 2024-05-29 NOTE — Progress Notes (Signed)
 Assessment & Plan Irritable bowel syndrome with diarrhea Education provided on diet for IBS.  Orders:   dicyclomine (BENTYL) 10 MG capsule; Take 1 capsule (10 mg total) by mouth 4 (four) times daily -  before meals and at bedtime.  Immunization due  Orders:   Flu vaccine trivalent PF, 6mos and older(Flulaval,Afluria,Fluarix,Fluzone)    Follow up plan: Return if symptoms worsen or fail to improve.  Niki Rung, MSN, APRN, FNP-C  Subjective:  HPI: Brenda [name] is a 38 y.o. female presenting on 05/29/2024 for gi upset (Increased gas, epigastric pain, after meals feels an urgency to have BM, some diarrhea, no vomiting/For a couple of weeks )  Discussed the use of AI scribe software for clinical note transcription with the patient, who gave verbal consent to proceed.  She has been experiencing increased gas, epigastric pain, and an urge to defecate after eating, accompanied by diarrhea but no vomiting. These symptoms have persisted for a couple of weeks. Describes stomach as upset and crampy.   She recalls similar episodes occurring in the past, notably during high school after taking antibiotics for strep throat, which she believed irritated her stomach lining. However, this current episode is described as the most painful, with cramping and a sensation of needing to defecate without actual bowel movements.  She has attempted self-treatment with Pepto-Bismol and her regular pantoprazole , but these have not alleviated her symptoms. She mentions missing two doses of her medication, Wegovy , due to a Medicaid issue, and wonders if this might be related to her symptoms.  Certain foods, such as Alfredo from [employer], seem to exacerbate her symptoms, causing her to rush to the bathroom. Despite this, she sometimes experiences symptoms without eating, leading to a lack of appetite.  She was previously diagnosed with irritable bowel syndrome (IBS) in high school and recalls taking  small pills for it at that time. She describes her current symptoms as random but more consistent recently.       ROS: Negative unless specifically indicated above in HPI.   Relevant past medical history reviewed and updated as indicated.   Allergies and medications reviewed and updated.   Current Outpatient Medications:    albuterol  (VENTOLIN  HFA) 108 (90 Base) MCG/ACT inhaler, Inhale 1-2 puffs into the lungs every 6 (six) hours as needed for wheezing or shortness of breath., Disp: 8.5 g, Rfl: 8   dicyclomine (BENTYL) 10 MG capsule, Take 1 capsule (10 mg total) by mouth 4 (four) times daily -  before meals and at bedtime., Disp: 120 capsule, Rfl: 0   fluticasone  (FLONASE ) 50 MCG/ACT nasal spray, Place 2 sprays into both nostrils daily., Disp: 16 g, Rfl: 6   hydrochlorothiazide  (HYDRODIURIL ) 25 MG tablet, Take 1 tablet (25 mg total) by mouth daily., Disp: 90 tablet, Rfl: 3   loratadine  (CLARITIN ) 10 MG tablet, Take 1 tablet (10 mg total) by mouth daily as needed for allergies., Disp: 90 tablet, Rfl: 1   pantoprazole  (PROTONIX ) 40 MG tablet, Take 1 tablet (40 mg total) by mouth daily., Disp: 90 tablet, Rfl: 1   semaglutide -weight management (WEGOVY ) 0.5 MG/0.5ML SOAJ SQ injection, Inject 0.5 mg into the skin once a week., Disp: 2 mL, Rfl: 0   tacrolimus  (PROTOPIC ) 0.1 % ointment, APPLY TOPICALLY TWICE DAILY AS NEEDED ON FACE, Disp: 30 g, Rfl: 0   triamcinolone  cream (KENALOG ) 0.1 %, APPLY TOPICALLY TO THE AFFECTED AREA TWICE DAILY, Disp: 30 g, Rfl: 2   valACYclovir  (VALTREX ) 500 MG tablet, Take 500 mg  by mouth daily., Disp: , Rfl:    valsartan  (DIOVAN ) 40 MG tablet, TAKE 1 TABLET(40 MG) BY MOUTH DAILY, Disp: 90 tablet, Rfl: 1  Allergies  Allergen Reactions   Lexapro  [Escitalopram ] Other (See Comments)    dizziness   Sertraline Other (See Comments)    Made her cry all the time    Objective:   BP 124/82   Pulse 70   Temp 98.2 F (36.8 C)   Resp 18   Ht 5' 3 (1.6 m)   Wt 266 lb  (120.7 kg)   LMP 05/22/2024 (Approximate)   Breastfeeding No   BMI 47.12 kg/m    Physical Exam Vitals reviewed.  Constitutional:      General: She is not in acute distress.    Appearance: Normal appearance. She is not ill-appearing, toxic-appearing or diaphoretic.  HENT:     Head: Normocephalic and atraumatic.  Eyes:     General: No scleral icterus.       Right eye: No discharge.        Left eye: No discharge.     Conjunctiva/sclera: Conjunctivae normal.  Cardiovascular:     Rate and Rhythm: Normal rate.  Pulmonary:     Effort: Pulmonary effort is normal. No respiratory distress.  Abdominal:     General: Bowel sounds are normal.     Palpations: Abdomen is soft. There is no hepatomegaly or splenomegaly.     Tenderness: There is no abdominal tenderness. There is no guarding or rebound.  Musculoskeletal:        General: Normal range of motion.     Cervical back: Normal range of motion.  Skin:    General: Skin is warm and dry.     Capillary Refill: Capillary refill takes less than 2 seconds.  Neurological:     General: No focal deficit present.     Mental Status: She is alert and oriented to person, place, and time. Mental status is at baseline.  Psychiatric:        Mood and Affect: Mood normal.        Behavior: Behavior normal.        Thought Content: Thought content normal.        Judgment: Judgment normal.

## 2024-05-30 NOTE — Telephone Encounter (Signed)
Appointment yesterday

## 2024-05-31 ENCOUNTER — Encounter: Payer: Self-pay | Admitting: Internal Medicine

## 2024-05-31 ENCOUNTER — Telehealth: Payer: Self-pay

## 2024-06-01 ENCOUNTER — Other Ambulatory Visit (HOSPITAL_COMMUNITY): Payer: Self-pay

## 2024-06-01 NOTE — Telephone Encounter (Signed)
 I am not sure if [employer] still covers it - I think they changed their policy but we can try -- she will need to come in for we have documentation for PA

## 2024-06-02 ENCOUNTER — Other Ambulatory Visit (HOSPITAL_COMMUNITY): Payer: Self-pay

## 2024-06-02 NOTE — Telephone Encounter (Signed)
 Lets have her come in for follow-up and we can review this and discuss options

## 2024-06-07 NOTE — Telephone Encounter (Signed)
 Sorry, but I was looking into this more in depth and it doesn't look like the prior auth was done for Zepbound , I don't even see it on her med list. Did the Dr change from the Wegovy  to the Zepbound ?

## 2024-06-08 ENCOUNTER — Encounter: Payer: Self-pay | Admitting: Internal Medicine

## 2024-06-08 NOTE — Progress Notes (Signed)
 Subjective:    Patient ID: Brenda [name], female    DOB: [birth year], 38 y.o.   MRN: [id number]     HPI Brenda [name] is here for follow up of her chronic medical problems.  Wegovy  0.5 mg has helped a little .  No side effects.  Insurance no longer covering.   Trying to lose weight but needs help.    Epigastric pain x 1 month.  Urge to go to the bathroom and would not always have to go - but sometimes she would.  Dicyclomine ? Help.     This morning - unsettled stomach and had soft stool.     Medications and allergies reviewed with patient and updated if appropriate.  Current Outpatient Medications on File Prior to Visit  Medication Sig Dispense Refill   albuterol  (VENTOLIN  HFA) 108 (90 Base) MCG/ACT inhaler Inhale 1-2 puffs into the lungs every 6 (six) hours as needed for wheezing or shortness of breath. 8.5 g 8   dicyclomine (BENTYL) 10 MG capsule Take 1 capsule (10 mg total) by mouth 4 (four) times daily -  before meals and at bedtime. 120 capsule 0   fluticasone  (FLONASE ) 50 MCG/ACT nasal spray Place 2 sprays into both nostrils daily. 16 g 6   hydrochlorothiazide  (HYDRODIURIL ) 25 MG tablet Take 1 tablet (25 mg total) by mouth daily. 90 tablet 3   loratadine  (CLARITIN ) 10 MG tablet Take 1 tablet (10 mg total) by mouth daily as needed for allergies. 90 tablet 1   pantoprazole  (PROTONIX ) 40 MG tablet Take 1 tablet (40 mg total) by mouth daily. 90 tablet 1   semaglutide -weight management (WEGOVY ) 0.5 MG/0.5ML SOAJ SQ injection Inject 0.5 mg into the skin once a week. 2 mL 0   tacrolimus  (PROTOPIC ) 0.1 % ointment APPLY TOPICALLY TWICE DAILY AS NEEDED ON FACE 30 g 0   triamcinolone  cream (KENALOG ) 0.1 % APPLY TOPICALLY TO THE AFFECTED AREA TWICE DAILY 30 g 2   valACYclovir  (VALTREX ) 500 MG tablet Take 500 mg by mouth daily.     valsartan  (DIOVAN ) 40 MG tablet TAKE 1 TABLET(40 MG) BY MOUTH DAILY 90 tablet 1   No current facility-administered medications on file prior to visit.      Review of Systems  Constitutional:  Positive for appetite change (decreased). Negative for fever.  Respiratory:  Negative for cough, shortness of breath and wheezing.   Cardiovascular:  Negative for chest pain, palpitations and leg swelling.  Gastrointestinal:  Positive for abdominal pain (epigastric). Negative for blood in stool (no melena), constipation, diarrhea (soft or diarrhea - multiple times a day) and nausea.       No gerd  Neurological:  Negative for light-headedness and headaches.       Objective:   Vitals:   06/09/24 1421  BP: 136/70  Pulse: 65  Temp: 98.2 F (36.8 C)  SpO2: 97%   BP Readings from Last 3 Encounters:  06/09/24 136/70  05/29/24 124/82  03/02/24 132/84   Wt Readings from Last 3 Encounters:  06/09/24 264 lb (119.7 kg)  05/29/24 266 lb (120.7 kg)  03/02/24 266 lb (120.7 kg)   Body mass index is 46.77 kg/m.    Physical Exam Constitutional:      General: She is not in acute distress.    Appearance: Normal appearance.  HENT:     Head: Normocephalic and atraumatic.  Eyes:     Conjunctiva/sclera: Conjunctivae normal.  Cardiovascular:     Rate and Rhythm: Normal rate and  regular rhythm.     Heart sounds: Normal heart sounds.  Pulmonary:     Effort: Pulmonary effort is normal. No respiratory distress.     Breath sounds: Normal breath sounds. No wheezing.  Abdominal:     General: There is no distension.     Palpations: Abdomen is soft.     Tenderness: There is no abdominal tenderness.  Musculoskeletal:     Cervical back: Neck supple.     Right lower leg: No edema.     Left lower leg: No edema.  Lymphadenopathy:     Cervical: No cervical adenopathy.  Skin:    General: Skin is warm and dry.     Findings: No rash.  Neurological:     Mental Status: She is alert. Mental status is at baseline.  Psychiatric:        Mood and Affect: Mood normal.        Behavior: Behavior normal.        Lab Results  Component Value Date   WBC 3.2  (L) 09/04/2022   HGB 12.3 09/04/2022   HCT 38.2 09/04/2022   PLT 255.0 09/04/2022   GLUCOSE 90 03/02/2024   CHOL 179 03/02/2024   TRIG 88.0 03/02/2024   HDL 40.40 03/02/2024   LDLCALC 121 (H) 03/02/2024   ALT 24 03/02/2024   AST 15 03/02/2024   NA 138 03/02/2024   K 3.8 03/02/2024   CL 100 03/02/2024   CREATININE 0.98 03/02/2024   BUN 12 03/02/2024   CO2 31 03/02/2024   TSH 0.68 09/01/2021   HGBA1C 6.5 03/02/2024     Assessment & Plan:    See Problem List for Assessment and Plan of chronic medical problems.

## 2024-06-09 ENCOUNTER — Ambulatory Visit (INDEPENDENT_AMBULATORY_CARE_PROVIDER_SITE_OTHER): Admitting: Internal Medicine

## 2024-06-09 DIAGNOSIS — R1013 Epigastric pain: Secondary | ICD-10-CM | POA: Diagnosis not present

## 2024-06-09 DIAGNOSIS — G4733 Obstructive sleep apnea (adult) (pediatric): Secondary | ICD-10-CM

## 2024-06-09 DIAGNOSIS — I10 Essential (primary) hypertension: Secondary | ICD-10-CM

## 2024-06-09 DIAGNOSIS — Z6841 Body Mass Index (BMI) 40.0 and over, adult: Secondary | ICD-10-CM

## 2024-06-09 DIAGNOSIS — Z7985 Long-term (current) use of injectable non-insulin antidiabetic drugs: Secondary | ICD-10-CM | POA: Diagnosis not present

## 2024-06-09 DIAGNOSIS — E1169 Type 2 diabetes mellitus with other specified complication: Secondary | ICD-10-CM | POA: Diagnosis not present

## 2024-06-09 DIAGNOSIS — K219 Gastro-esophageal reflux disease without esophagitis: Secondary | ICD-10-CM

## 2024-06-09 NOTE — Patient Instructions (Addendum)
       Medications changes include :   None    An ultrasound of your abdomen was ordered and someone will call you to schedule an appointment.     Return in about 3 months (around 09/09/2024) for follow up.

## 2024-06-11 MED ORDER — TIRZEPATIDE 2.5 MG/0.5ML ~~LOC~~ SOAJ
2.5000 mg | SUBCUTANEOUS | 0 refills | Status: DC
  Filled 2024-06-11 – 2024-06-13 (×2): qty 2, 28d supply, fill #0

## 2024-06-11 NOTE — Assessment & Plan Note (Signed)
 Chronic BMI 46.77 Comorbidities of hypertension, DM, sleep apnea, GERD Stressed regular exercise - goal 4 - 5 days a week Discussed healthy diet-advised high-protein, high vegetables and fiber --low carbs and sugars Start mounjaro 2.5 mg weekly

## 2024-06-11 NOTE — Assessment & Plan Note (Signed)
 Chronic Controlled - Borderline Continue valsartan  40 mg daily, HCTZ 25 mg daily Monitor BP at home Discussed the importance of low-sodium diet, regular exercise and weight loss

## 2024-06-11 NOTE — Assessment & Plan Note (Signed)
 Chronic Has CPAP Does not tolerate it and typically takes it off in the middle of the night Does have fatigue, difficulty losing weight

## 2024-06-11 NOTE — Assessment & Plan Note (Signed)
 Chronic GERD controlled Continue pantoprazole 40 mg daily

## 2024-06-11 NOTE — Assessment & Plan Note (Signed)
 Chronic Associated with morbid obesity - BMI 46.77  Lab Results  Component Value Date   HGBA1C 6.5 03/02/2024    Low sugar / carb diet Stressed regular exercise stressed weight loss Start mounjaro 2.5 mg weekly

## 2024-06-12 ENCOUNTER — Telehealth: Payer: Self-pay

## 2024-06-12 ENCOUNTER — Other Ambulatory Visit (HOSPITAL_COMMUNITY): Payer: Self-pay

## 2024-06-12 ENCOUNTER — Telehealth (HOSPITAL_COMMUNITY): Payer: Self-pay | Admitting: Pharmacy Technician

## 2024-06-12 NOTE — Telephone Encounter (Signed)
 PA request has been Submitted. New Encounter has been or will be created for follow up. For additional info see Pharmacy Prior Auth telephone encounter from 06/12/24.

## 2024-06-12 NOTE — Telephone Encounter (Signed)
 Pharmacy Patient Advocate Encounter   Received notification from Pt Calls Messages that prior authorization for Mounjaro 2.5mg /0.83ml is required/requested.   Insurance verification completed.   The patient is insured through [facility].   Per test claim: PA required; PA submitted to above mentioned insurance via Latent Key/confirmation #/EOC [id number] Status is pending

## 2024-06-13 ENCOUNTER — Other Ambulatory Visit (HOSPITAL_COMMUNITY): Payer: Self-pay

## 2024-06-13 NOTE — Telephone Encounter (Signed)
 Pharmacy Patient Advocate Encounter  Received notification from [facility] that Prior Authorization for Mounjaro 2.5mg /0.72ml has been APPROVED from 06/12/24 to 06/12/25   PA #/Case ID/Reference #: [id number]

## 2024-06-20 ENCOUNTER — Ambulatory Visit
Admission: RE | Admit: 2024-06-20 | Discharge: 2024-06-20 | Disposition: A | Source: Ambulatory Visit | Attending: Internal Medicine

## 2024-06-20 ENCOUNTER — Ambulatory Visit: Payer: Self-pay | Admitting: Internal Medicine

## 2024-06-20 DIAGNOSIS — R1013 Epigastric pain: Secondary | ICD-10-CM | POA: Diagnosis not present

## 2024-06-20 DIAGNOSIS — K760 Fatty (change of) liver, not elsewhere classified: Secondary | ICD-10-CM | POA: Insufficient documentation

## 2024-06-27 ENCOUNTER — Other Ambulatory Visit: Payer: Self-pay

## 2024-06-27 ENCOUNTER — Other Ambulatory Visit (HOSPITAL_COMMUNITY): Payer: Self-pay

## 2024-06-27 MED ORDER — TRULICITY 0.75 MG/0.5ML ~~LOC~~ SOAJ: 0.7500 mg | SUBCUTANEOUS | 0 refills | Status: DC

## 2024-06-27 MED ORDER — TRULICITY 0.75 MG/0.5ML ~~LOC~~ SOAJ
0.7500 mg | SUBCUTANEOUS | 0 refills | Status: DC
  Filled 2024-06-27 – 2024-07-04 (×2): qty 2, 28d supply, fill #0

## 2024-06-27 NOTE — Addendum Note (Signed)
 Addended by: GEOFM GLADE PARAS on: 06/27/2024 05:16 AM   Modules accepted: Orders

## 2024-07-04 ENCOUNTER — Other Ambulatory Visit (HOSPITAL_COMMUNITY): Payer: Self-pay

## 2024-07-12 NOTE — Telephone Encounter (Signed)
 Addressed.

## 2024-07-27 ENCOUNTER — Other Ambulatory Visit: Payer: Self-pay | Admitting: Internal Medicine

## 2024-07-28 ENCOUNTER — Other Ambulatory Visit (HOSPITAL_COMMUNITY): Payer: Self-pay

## 2024-07-28 MED ORDER — TRULICITY 0.75 MG/0.5ML ~~LOC~~ SOAJ
0.7500 mg | SUBCUTANEOUS | 0 refills | Status: AC
  Filled 2024-07-28: qty 2, 28d supply, fill #0

## 2024-08-01 ENCOUNTER — Other Ambulatory Visit (HOSPITAL_COMMUNITY): Payer: Self-pay

## 2024-08-09 ENCOUNTER — Other Ambulatory Visit (HOSPITAL_COMMUNITY): Payer: Self-pay

## 2024-08-22 ENCOUNTER — Encounter: Payer: Self-pay | Admitting: Internal Medicine

## 2024-08-23 ENCOUNTER — Telehealth (HOSPITAL_COMMUNITY): Payer: Self-pay

## 2024-08-23 ENCOUNTER — Other Ambulatory Visit (HOSPITAL_COMMUNITY): Payer: Self-pay

## 2024-08-23 ENCOUNTER — Other Ambulatory Visit: Payer: Self-pay

## 2024-08-23 ENCOUNTER — Telehealth (HOSPITAL_COMMUNITY): Payer: Self-pay | Admitting: Pharmacy Technician

## 2024-08-23 MED ORDER — DULAGLUTIDE 1.5 MG/0.5ML ~~LOC~~ SOAJ
1.5000 mg | SUBCUTANEOUS | 0 refills | Status: AC
  Filled 2024-08-23 – 2024-09-08 (×4): qty 2, 28d supply, fill #0

## 2024-08-23 NOTE — Telephone Encounter (Signed)
 PA request has been Received. New Encounter has been or will be created for follow up. For additional info see Pharmacy Prior Auth telephone encounter from 08/23/24.

## 2024-08-23 NOTE — Telephone Encounter (Signed)
 Pharmacy Patient Advocate Encounter   Received notification from Pt Calls Messages that prior authorization for Trulicity  1.5MG /0.5ML auto-injectors  is required/requested.   Insurance verification completed.   The patient is insured through [employer].   Per test claim: PA required; PA submitted to above mentioned insurance via Latent Key/confirmation #/EOC [id number] Status is pending

## 2024-08-24 ENCOUNTER — Other Ambulatory Visit (HOSPITAL_COMMUNITY): Payer: Self-pay

## 2024-08-25 ENCOUNTER — Telehealth: Payer: Self-pay

## 2024-08-25 NOTE — Telephone Encounter (Signed)
 No contact number for return call given.

## 2024-08-25 NOTE — Telephone Encounter (Signed)
 Copied from CRM [phone]. Topic: General - Other >> Aug 25, 2024 12:49 PM Terri G wrote: Reason for CRM: [place] from [employer] calling regarding if patient received a denial letter on 1/15 about patient not being a patient here.

## 2024-08-29 ENCOUNTER — Other Ambulatory Visit (HOSPITAL_COMMUNITY): Payer: Self-pay

## 2024-08-30 ENCOUNTER — Other Ambulatory Visit (HOSPITAL_BASED_OUTPATIENT_CLINIC_OR_DEPARTMENT_OTHER): Payer: Self-pay

## 2024-09-01 ENCOUNTER — Telehealth (HOSPITAL_COMMUNITY): Payer: Self-pay

## 2024-09-01 ENCOUNTER — Other Ambulatory Visit (HOSPITAL_COMMUNITY): Payer: Self-pay

## 2024-09-01 NOTE — Telephone Encounter (Signed)
 PA request has been Received. New Encounter has been or will be created for follow up. For additional info see Pharmacy Prior Auth telephone encounter from 08/23/24.

## 2024-09-01 NOTE — Telephone Encounter (Signed)
 Medicaid closed out the request because they were still showing that she had primary insurance, She has called and fixed that. Resubmitted PA with a new key# [id number]

## 2024-09-04 ENCOUNTER — Other Ambulatory Visit (HOSPITAL_COMMUNITY): Payer: Self-pay

## 2024-09-05 ENCOUNTER — Other Ambulatory Visit: Payer: Self-pay

## 2024-09-05 ENCOUNTER — Other Ambulatory Visit (HOSPITAL_COMMUNITY): Payer: Self-pay

## 2024-09-06 ENCOUNTER — Other Ambulatory Visit (HOSPITAL_COMMUNITY): Payer: Self-pay

## 2024-09-08 ENCOUNTER — Other Ambulatory Visit (HOSPITAL_COMMUNITY): Payer: Self-pay

## 2024-09-14 ENCOUNTER — Other Ambulatory Visit: Payer: Self-pay | Admitting: Internal Medicine

## 2024-11-01 ENCOUNTER — Ambulatory Visit: Admitting: Neurology
